# Patient Record
Sex: Male | Born: 1960
Health system: Southern US, Community
[De-identification: ages and names within clinical notes are randomized; demographics above are authoritative.]

## PROBLEM LIST (undated history)

## (undated) DIAGNOSIS — R011 Cardiac murmur, unspecified: Secondary | ICD-10-CM

## (undated) DIAGNOSIS — K219 Gastro-esophageal reflux disease without esophagitis: Secondary | ICD-10-CM

## (undated) DIAGNOSIS — M719 Bursopathy, unspecified: Secondary | ICD-10-CM

## (undated) DIAGNOSIS — I10 Essential (primary) hypertension: Secondary | ICD-10-CM

## (undated) DIAGNOSIS — I5189 Other ill-defined heart diseases: Secondary | ICD-10-CM

## (undated) HISTORY — PX: TONSILLECTOMY: SUR1361

## (undated) HISTORY — PX: OTHER SURGICAL HISTORY: SHX169

## (undated) HISTORY — PX: CHOLECYSTECTOMY: SHX55

---

## 2011-09-01 ENCOUNTER — Encounter (INDEPENDENT_AMBULATORY_CARE_PROVIDER_SITE_OTHER): Payer: Self-pay | Admitting: *Deleted

## 2012-01-19 ENCOUNTER — Encounter (HOSPITAL_COMMUNITY): Payer: Self-pay | Admitting: Pharmacy Technician

## 2012-01-24 NOTE — H&P (Signed)
Aaron Matthews is an 51 y.o. male.    Chief Complaint:  Left hip OA and pain   HPI: Pt is a 51 y.o. male complaining of left hip pain since February of this year. Pain had continually increased since the beginning. X-rays in the clinic show arthritic changes of the left hip.  An MRI was obtained that showed a large subchondral fracture of the proximal left femur with extensive surrounding marrow edema.  Pt has tried various conservative treatments which have failed to alleviate their symptoms, including NSAIDs and intra-articular injection.  The intra-articular injection did help for a couple of hours, but the pain quickly returned. Various options are discussed with the patient. Risks, benefits and expectations were discussed with the patient. Patient understand the risks, benefits and expectations and wishes to proceed with surgery.   PCP:  Cassell Smiles., MD  D/C Plans:  Home with HHPT  Post-op Meds:   Rx given for ASA, Robaxin, Iron, Colace and MiraLax  Tranexamic Acid:   To be given  Decadron:   Not to be given - DM  PMH: HTN Hemorrhoids Dentures Diabetes  PSH: Tonsillectomy Cholecystectomy  Social History: Admits to smoking previously, but has quit.   Allergies:  Allergies  Allergen Reactions  . Penicillins Swelling and Rash    Medications: No current facility-administered medications for this encounter.   Current Outpatient Prescriptions  Medication Sig Dispense Refill  . enalapril (VASOTEC) 10 MG tablet Take 10 mg by mouth 2 (two) times daily.      . insulin aspart (NOVOLOG) 100 UNIT/ML injection Inject 15-19 Units into the skin 3 (three) times daily before meals. 100-150=15units 150-200=16units 201-250 17 units 251-300 18 units 301-350 19 units      . insulin detemir (LEVEMIR) 100 UNIT/ML injection Inject 60 Units into the skin at bedtime.      . metFORMIN (GLUCOPHAGE) 1000 MG tablet Take 1,000 mg by mouth 2 (two) times daily with a meal.      . ranitidine  (ZANTAC) 150 MG tablet Take 150 mg by mouth 2 (two) times daily.      . traMADol-acetaminophen (ULTRACET) 37.5-325 MG per tablet Take 1 tablet by mouth every 6 (six) hours as needed. Pain         ROS: Review of Systems  Constitutional: Negative.   HENT: Negative.   Eyes: Negative.   Respiratory: Negative.   Cardiovascular: Negative.   Gastrointestinal: Negative.   Genitourinary: Negative.   Musculoskeletal: Positive for joint pain.  Neurological: Negative.   Endo/Heme/Allergies: Negative.   Psychiatric/Behavioral: Negative.      Physical Exam: BP:   135/96  ;  HR:   90  ; Resp:   18  ; Physical Exam  Constitutional: He is oriented to person, place, and time and well-developed, well-nourished, and in no distress.  HENT:  Head: Normocephalic and atraumatic.  Nose: Nose normal.  Mouth/Throat: Oropharynx is clear and moist. He has dentures.  Eyes: Pupils are equal, round, and reactive to light.  Neck: Neck supple. No JVD present. No tracheal deviation present. No thyromegaly present.  Cardiovascular: Normal rate, regular rhythm, normal heart sounds and intact distal pulses.   Pulmonary/Chest: Effort normal and breath sounds normal. No stridor. No respiratory distress. He has no wheezes.  Abdominal: Soft. There is no tenderness. There is no guarding.  Musculoskeletal:       Left hip: He exhibits decreased range of motion, decreased strength, tenderness and bony tenderness. He exhibits no crepitus, no deformity and no  laceration.  Lymphadenopathy:    He has no cervical adenopathy.  Neurological: He is alert and oriented to person, place, and time.  Skin: Skin is warm and dry.  Psychiatric: Affect normal.      Assessment/Plan Assessment:   Left hip OA and pain   Plan: Patient will undergo a left total hip arthroplasty, anterior approach on 01/30/2012 per Dr. Charlann Boxer at [facility]. Risks benefits and expectations were discussed with the patient. Patient understand risks,  benefits and expectations and wishes to proceed.   Aaron Matthews   PAC  01/24/2012, 11:48 AM

## 2012-01-25 ENCOUNTER — Encounter (HOSPITAL_COMMUNITY)
Admission: RE | Admit: 2012-01-25 | Discharge: 2012-01-25 | Disposition: A | Discharge status: Home / Self Care | Payer: BC Managed Care – PPO | Source: Ambulatory Visit | Attending: Orthopedic Surgery | Admitting: Orthopedic Surgery

## 2012-01-25 ENCOUNTER — Ambulatory Visit (HOSPITAL_COMMUNITY)
Admission: RE | Admit: 2012-01-25 | Discharge: 2012-01-25 | Disposition: A | Discharge status: Home / Self Care | Payer: BC Managed Care – PPO | Source: Ambulatory Visit | Attending: Orthopedic Surgery | Admitting: Orthopedic Surgery

## 2012-01-25 ENCOUNTER — Encounter (HOSPITAL_COMMUNITY): Payer: Self-pay

## 2012-01-25 DIAGNOSIS — M169 Osteoarthritis of hip, unspecified: Secondary | ICD-10-CM | POA: Insufficient documentation

## 2012-01-25 DIAGNOSIS — K219 Gastro-esophageal reflux disease without esophagitis: Secondary | ICD-10-CM | POA: Insufficient documentation

## 2012-01-25 DIAGNOSIS — Z01812 Encounter for preprocedural laboratory examination: Secondary | ICD-10-CM | POA: Insufficient documentation

## 2012-01-25 DIAGNOSIS — I10 Essential (primary) hypertension: Secondary | ICD-10-CM | POA: Insufficient documentation

## 2012-01-25 DIAGNOSIS — E119 Type 2 diabetes mellitus without complications: Secondary | ICD-10-CM | POA: Insufficient documentation

## 2012-01-25 DIAGNOSIS — Z01810 Encounter for preprocedural cardiovascular examination: Secondary | ICD-10-CM | POA: Insufficient documentation

## 2012-01-25 DIAGNOSIS — M1610 Unilateral primary osteoarthritis, unspecified hip: Secondary | ICD-10-CM | POA: Insufficient documentation

## 2012-01-25 HISTORY — DX: Cardiac murmur, unspecified: R01.1

## 2012-01-25 HISTORY — DX: Bursopathy, unspecified: M71.9

## 2012-01-25 HISTORY — DX: Essential (primary) hypertension: I10

## 2012-01-25 HISTORY — DX: Gastro-esophageal reflux disease without esophagitis: K21.9

## 2012-01-25 LAB — BASIC METABOLIC PANEL
CO2: 28 mEq/L (ref 19–32)
Chloride: 100 mEq/L (ref 96–112)
Creatinine, Ser: 0.72 mg/dL (ref 0.50–1.35)
GFR calc Af Amer: >90 mL/min (ref >90)
Potassium: 4 mEq/L (ref 3.5–5.1)
Sodium: 138 mEq/L (ref 135–145)

## 2012-01-25 LAB — URINALYSIS, ROUTINE W REFLEX MICROSCOPIC
Ketones, ur: NEGATIVE mg/dL
Leukocytes, UA: NEGATIVE
Nitrite: NEGATIVE
Protein, ur: 30 mg/dL — AB
pH: 5.5 (ref 5.0–8.0)

## 2012-01-25 LAB — CBC
HCT: 38.4 % — ABNORMAL LOW (ref 39.0–52.0)
MCV: 84.6 fL (ref 78.0–100.0)
Platelets: 220 10*3/uL (ref 150–400)
RBC: 4.54 MIL/uL (ref 4.22–5.81)
RDW: 12.8 % (ref 11.5–15.5)
WBC: 6.8 10*3/uL (ref 4.0–10.5)

## 2012-01-25 LAB — URINE MICROSCOPIC-ADD ON

## 2012-01-25 LAB — APTT: aPTT: 32 seconds (ref 24–37)

## 2012-01-25 LAB — SURGICAL PCR SCREEN: MRSA, PCR: NEGATIVE

## 2012-01-25 LAB — PROTIME-INR: INR: 0.93 (ref 0.00–1.49)

## 2012-01-25 NOTE — Patient Instructions (Addendum)
20 Bensyn Vandegrift  01/25/2012   Your procedure is scheduled on:  01/30/12   Tuesday  Surgery 4098-1191  Report to [facility] at 0615      AM.  Call this number if you have problems the morning of surgery: [phone]      Remember: HAVE SNACK BEFORE BED OR MIDNIGHT Monday NIGHT/     TAKE ONE HALF DOSE INSULIN  Monday NIGHT  Do not eat food  Or drink :After Midnight. MONDAY NIGHT   Take these medicines the morning of surgery with A SIP OF WATER: ZANTAC DO NOT TAKE ANY DIABETES MEDICINE Tuesday MORNING  .  Contacts, dentures or partial plates can not be worn to surgery  Leave suitcase in the car. After surgery it may be brought to your room.  For patients admitted to the hospital, checkout time is 11:00 AM day of  discharge.             SPECIAL INSTRUCTIONS- SEE [place] PREPARING FOR SURGERY INSTRUCTION SHEET-     DO NOT WEAR JEWELRY, LOTIONS, POWDERS, OR PERFUMES.  WOMEN-- DO NOT SHAVE LEGS OR UNDERARMS FOR 12 HOURS BEFORE SHOWERS. MEN MAY SHAVE FACE.  Patients discharged the day of surgery will not be allowed to drive home. IF going home the day of surgery, you must have a driver and someone to stay with you for the first 24 hours  Name and phone number of your driver:       Wife or daughter                                                                 Please read over the following fact sheets that you were given: MRSA Information, Incentive Spirometry Sheet, Blood Transfusion Sheet  Information                                                                                   Monti Jilek  PST 336  [phone]

## 2012-01-25 NOTE — Progress Notes (Signed)
Dr Charlann Boxer or Matt-  ANCEF ORDERED PRE OP- HAS allergy to PENICILLIN- rash and swelling-   PLEASE CLARIFY IN EPIC IF YOU STILL WANT THIS OR SOMETHING ELSE-  Surgery 01/30/12  [facility]

## 2012-01-25 NOTE — Pre-Procedure Instructions (Signed)
Clearance Dr Sherwood Gambler on chart

## 2012-01-25 NOTE — Progress Notes (Signed)
01/25/12 1421  OBSTRUCTIVE SLEEP APNEA  Have you ever been diagnosed with sleep apnea through a sleep study? No  Do you snore loudly (loud enough to be heard through closed doors)?  1  Do you often feel tired, fatigued, or sleepy during the daytime? 0  Has anyone observed you stop breathing during your sleep? 0  Do you have, or are you being treated for high blood pressure? 1  BMI more than 35 kg/m2? 1  Age over 51 years old? 1  Neck circumference greater than 40 cm/18 inches? 1  Gender: 1  Obstructive Sleep Apnea Score 6   Score 4 or greater  Results sent to PCP

## 2012-01-26 NOTE — Pre-Procedure Instructions (Signed)
Faxed request to Dr Charlann Boxer and Bartholomew Crews to review abnormal urine with micro through EPIC system-   Urine was done 01/25/12

## 2012-01-29 MED ORDER — TRANEXAMIC ACID 100 MG/ML IV SOLN
15.0000 mg/kg | Freq: Once | INTRAVENOUS | Status: AC
  Administered 2012-01-30: 2220 mg via INTRAVENOUS
  Filled 2012-01-29: qty 22.2

## 2012-01-30 ENCOUNTER — Inpatient Hospital Stay (HOSPITAL_COMMUNITY): Payer: BC Managed Care – PPO

## 2012-01-30 ENCOUNTER — Encounter (HOSPITAL_COMMUNITY): Payer: Self-pay | Admitting: *Deleted

## 2012-01-30 ENCOUNTER — Encounter (HOSPITAL_COMMUNITY)
Admission: RE | Disposition: A | Discharge status: Home Health | Payer: Self-pay | Source: Ambulatory Visit | Attending: Orthopedic Surgery

## 2012-01-30 ENCOUNTER — Inpatient Hospital Stay (HOSPITAL_COMMUNITY): Payer: BC Managed Care – PPO | Admitting: Anesthesiology

## 2012-01-30 ENCOUNTER — Inpatient Hospital Stay (HOSPITAL_COMMUNITY)
Admission: RE | Admit: 2012-01-30 | Discharge: 2012-01-31 | DRG: 818 | Disposition: A | Discharge status: Home Health | Payer: BC Managed Care – PPO | Source: Ambulatory Visit | Attending: Orthopedic Surgery | Admitting: Orthopedic Surgery

## 2012-01-30 ENCOUNTER — Encounter (HOSPITAL_COMMUNITY): Payer: Self-pay | Admitting: Anesthesiology

## 2012-01-30 DIAGNOSIS — R739 Hyperglycemia, unspecified: Secondary | ICD-10-CM

## 2012-01-30 DIAGNOSIS — Z96649 Presence of unspecified artificial hip joint: Secondary | ICD-10-CM

## 2012-01-30 DIAGNOSIS — E119 Type 2 diabetes mellitus without complications: Secondary | ICD-10-CM | POA: Diagnosis present

## 2012-01-30 DIAGNOSIS — Z794 Long term (current) use of insulin: Secondary | ICD-10-CM

## 2012-01-30 DIAGNOSIS — D500 Iron deficiency anemia secondary to blood loss (chronic): Secondary | ICD-10-CM

## 2012-01-30 DIAGNOSIS — M1610 Unilateral primary osteoarthritis, unspecified hip: Principal | ICD-10-CM | POA: Diagnosis present

## 2012-01-30 DIAGNOSIS — Z9089 Acquired absence of other organs: Secondary | ICD-10-CM

## 2012-01-30 DIAGNOSIS — I10 Essential (primary) hypertension: Secondary | ICD-10-CM | POA: Diagnosis present

## 2012-01-30 DIAGNOSIS — M169 Osteoarthritis of hip, unspecified: Principal | ICD-10-CM | POA: Diagnosis present

## 2012-01-30 DIAGNOSIS — E871 Hypo-osmolality and hyponatremia: Secondary | ICD-10-CM

## 2012-01-30 DIAGNOSIS — D62 Acute posthemorrhagic anemia: Secondary | ICD-10-CM | POA: Diagnosis not present

## 2012-01-30 DIAGNOSIS — K649 Unspecified hemorrhoids: Secondary | ICD-10-CM | POA: Diagnosis present

## 2012-01-30 DIAGNOSIS — Z87891 Personal history of nicotine dependence: Secondary | ICD-10-CM

## 2012-01-30 DIAGNOSIS — K219 Gastro-esophageal reflux disease without esophagitis: Secondary | ICD-10-CM | POA: Diagnosis present

## 2012-01-30 DIAGNOSIS — Z79899 Other long term (current) drug therapy: Secondary | ICD-10-CM

## 2012-01-30 DIAGNOSIS — Z6841 Body Mass Index (BMI) 40.0 and over, adult: Secondary | ICD-10-CM

## 2012-01-30 DIAGNOSIS — Z880 Allergy status to penicillin: Secondary | ICD-10-CM

## 2012-01-30 HISTORY — PX: TOTAL HIP ARTHROPLASTY: SHX124

## 2012-01-30 LAB — GLUCOSE, CAPILLARY
Glucose-Capillary: 254 mg/dL — ABNORMAL HIGH (ref 70–99)
Glucose-Capillary: 270 mg/dL — ABNORMAL HIGH (ref 70–99)

## 2012-01-30 SURGERY — ARTHROPLASTY, HIP, TOTAL, ANTERIOR APPROACH
Anesthesia: General | Site: Hip | Laterality: Left | Wound class: Clean

## 2012-01-30 MED ORDER — FLEET ENEMA 7-19 GM/118ML RE ENEM: 1.0000 | ENEMA | Freq: Once | RECTAL | Status: AC | PRN

## 2012-01-30 MED ORDER — CEFAZOLIN SODIUM-DEXTROSE 2-3 GM-% IV SOLR
2.0000 g | Freq: Four times a day (QID) | INTRAVENOUS | Status: AC
  Administered 2012-01-30 (×2): 2 g via INTRAVENOUS
  Filled 2012-01-30 (×2): qty 50

## 2012-01-30 MED ORDER — ONDANSETRON HCL 4 MG/2ML IJ SOLN
4.0000 mg | Freq: Four times a day (QID) | INTRAMUSCULAR | Status: DC | PRN
  Filled 2012-01-30: qty 2

## 2012-01-30 MED ORDER — HYDROMORPHONE HCL PF 1 MG/ML IJ SOLN: 0.2500 mg | INTRAMUSCULAR | Status: DC | PRN

## 2012-01-30 MED ORDER — HYDROCODONE-ACETAMINOPHEN 7.5-325 MG PO TABS
1.0000 | ORAL_TABLET | ORAL | Status: DC
  Administered 2012-01-30: 2 via ORAL
  Administered 2012-01-31: 1 via ORAL
  Administered 2012-01-31: 2 via ORAL
  Administered 2012-01-31: 1 via ORAL
  Filled 2012-01-30 (×4): qty 2

## 2012-01-30 MED ORDER — LACTATED RINGERS IV SOLN
INTRAVENOUS | Status: DC | PRN
  Administered 2012-01-30: 08:00:00 via INTRAVENOUS

## 2012-01-30 MED ORDER — MIDAZOLAM HCL 5 MG/5ML IJ SOLN
INTRAMUSCULAR | Status: DC | PRN
  Administered 2012-01-30: 2 mg via INTRAVENOUS

## 2012-01-30 MED ORDER — SUCCINYLCHOLINE CHLORIDE 20 MG/ML IJ SOLN
INTRAMUSCULAR | Status: DC | PRN
  Administered 2012-01-30: 200 mg via INTRAVENOUS

## 2012-01-30 MED ORDER — GLYCOPYRROLATE 0.2 MG/ML IJ SOLN
INTRAMUSCULAR | Status: DC | PRN
  Administered 2012-01-30: 0.4 mg via INTRAVENOUS

## 2012-01-30 MED ORDER — FAMOTIDINE 20 MG PO TABS
20.0000 mg | ORAL_TABLET | Freq: Two times a day (BID) | ORAL | Status: DC
  Administered 2012-01-30 – 2012-01-31 (×2): 20 mg via ORAL
  Filled 2012-01-30 (×3): qty 1

## 2012-01-30 MED ORDER — PROPOFOL 10 MG/ML IV BOLUS
INTRAVENOUS | Status: DC | PRN
  Administered 2012-01-30: 300 mg via INTRAVENOUS

## 2012-01-30 MED ORDER — FERROUS SULFATE 325 (65 FE) MG PO TABS
325.0000 mg | ORAL_TABLET | Freq: Three times a day (TID) | ORAL | Status: DC
  Administered 2012-01-30 – 2012-01-31 (×3): 325 mg via ORAL
  Filled 2012-01-30 (×5): qty 1

## 2012-01-30 MED ORDER — PROMETHAZINE HCL 25 MG/ML IJ SOLN: 6.2500 mg | INTRAMUSCULAR | Status: DC | PRN

## 2012-01-30 MED ORDER — ROCURONIUM BROMIDE 100 MG/10ML IV SOLN
INTRAVENOUS | Status: DC | PRN
  Administered 2012-01-30: 35 mg via INTRAVENOUS
  Administered 2012-01-30: 10 mg via INTRAVENOUS
  Administered 2012-01-30: 20 mg via INTRAVENOUS
  Administered 2012-01-30: 5 mg via INTRAVENOUS

## 2012-01-30 MED ORDER — PHENOL 1.4 % MT LIQD: 1.0000 | OROMUCOSAL | Status: DC | PRN

## 2012-01-30 MED ORDER — METOCLOPRAMIDE HCL 5 MG/ML IJ SOLN: 5.0000 mg | Freq: Three times a day (TID) | INTRAMUSCULAR | Status: DC | PRN

## 2012-01-30 MED ORDER — METHOCARBAMOL 500 MG PO TABS
500.0000 mg | ORAL_TABLET | Freq: Four times a day (QID) | ORAL | Status: DC | PRN
  Administered 2012-01-31: 500 mg via ORAL
  Filled 2012-01-30: qty 1

## 2012-01-30 MED ORDER — RIVAROXABAN 10 MG PO TABS
10.0000 mg | ORAL_TABLET | Freq: Every day | ORAL | Status: DC
  Administered 2012-01-31: 10 mg via ORAL
  Filled 2012-01-30 (×2): qty 1

## 2012-01-30 MED ORDER — HYDROMORPHONE HCL PF 1 MG/ML IJ SOLN
INTRAMUSCULAR | Status: DC | PRN
  Administered 2012-01-30 (×2): 0.5 mg via INTRAVENOUS
  Administered 2012-01-30: 1 mg via INTRAVENOUS

## 2012-01-30 MED ORDER — HYDROMORPHONE HCL PF 1 MG/ML IJ SOLN
0.5000 mg | INTRAMUSCULAR | Status: DC | PRN
  Administered 2012-01-30 – 2012-01-31 (×2): 1 mg via INTRAVENOUS
  Filled 2012-01-30 (×2): qty 1

## 2012-01-30 MED ORDER — LIDOCAINE HCL (CARDIAC) 20 MG/ML IV SOLN
INTRAVENOUS | Status: DC | PRN
  Administered 2012-01-30: 80 mg via INTRAVENOUS

## 2012-01-30 MED ORDER — POLYETHYLENE GLYCOL 3350 17 G PO PACK
17.0000 g | PACK | Freq: Two times a day (BID) | ORAL | Status: DC
  Administered 2012-01-30 – 2012-01-31 (×2): 17 g via ORAL

## 2012-01-30 MED ORDER — ZOLPIDEM TARTRATE 5 MG PO TABS: 5.0000 mg | ORAL_TABLET | Freq: Every evening | ORAL | Status: DC | PRN

## 2012-01-30 MED ORDER — FENTANYL CITRATE 0.05 MG/ML IJ SOLN
INTRAMUSCULAR | Status: DC | PRN
  Administered 2012-01-30 (×2): 100 ug via INTRAVENOUS
  Administered 2012-01-30: 50 ug via INTRAVENOUS

## 2012-01-30 MED ORDER — ONDANSETRON HCL 4 MG PO TABS: 4.0000 mg | ORAL_TABLET | Freq: Four times a day (QID) | ORAL | Status: DC | PRN

## 2012-01-30 MED ORDER — INSULIN DETEMIR 100 UNIT/ML ~~LOC~~ SOLN
60.0000 [IU] | Freq: Every day | SUBCUTANEOUS | Status: DC
  Administered 2012-01-30: 60 [IU] via SUBCUTANEOUS
  Filled 2012-01-30: qty 10

## 2012-01-30 MED ORDER — INSULIN ASPART PROT & ASPART (70-30 MIX) 100 UNIT/ML ~~LOC~~ SUSP
5.0000 [IU] | Freq: Once | SUBCUTANEOUS | Status: DC
  Filled 2012-01-30: qty 3

## 2012-01-30 MED ORDER — CELECOXIB 200 MG PO CAPS
200.0000 mg | ORAL_CAPSULE | Freq: Two times a day (BID) | ORAL | Status: DC
  Administered 2012-01-30 – 2012-01-31 (×2): 200 mg via ORAL
  Filled 2012-01-30 (×3): qty 1

## 2012-01-30 MED ORDER — ALUM & MAG HYDROXIDE-SIMETH 200-200-20 MG/5ML PO SUSP: 30.0000 mL | ORAL | Status: DC | PRN

## 2012-01-30 MED ORDER — MENTHOL 3 MG MT LOZG: 1.0000 | LOZENGE | OROMUCOSAL | Status: DC | PRN

## 2012-01-30 MED ORDER — DOCUSATE SODIUM 100 MG PO CAPS
100.0000 mg | ORAL_CAPSULE | Freq: Two times a day (BID) | ORAL | Status: DC
  Administered 2012-01-30 – 2012-01-31 (×2): 100 mg via ORAL

## 2012-01-30 MED ORDER — 0.9 % SODIUM CHLORIDE (POUR BTL) OPTIME
TOPICAL | Status: DC | PRN
  Administered 2012-01-30: 1000 mL

## 2012-01-30 MED ORDER — SODIUM CHLORIDE 0.9 % IV SOLN
100.0000 mL/h | INTRAVENOUS | Status: DC
  Administered 2012-01-30 – 2012-01-31 (×3): 100 mL/h via INTRAVENOUS
  Filled 2012-01-30 (×9): qty 1000

## 2012-01-30 MED ORDER — ACETAMINOPHEN 10 MG/ML IV SOLN
INTRAVENOUS | Status: DC | PRN
  Administered 2012-01-30: 1000 mg via INTRAVENOUS

## 2012-01-30 MED ORDER — NEOSTIGMINE METHYLSULFATE 1 MG/ML IJ SOLN
INTRAMUSCULAR | Status: DC | PRN
  Administered 2012-01-30: 3 mg via INTRAVENOUS

## 2012-01-30 MED ORDER — INSULIN ASPART 100 UNIT/ML ~~LOC~~ SOLN
5.0000 [IU] | Freq: Once | SUBCUTANEOUS | Status: AC
  Administered 2012-01-30: 5 [IU] via SUBCUTANEOUS
  Filled 2012-01-30: qty 1

## 2012-01-30 MED ORDER — DIPHENHYDRAMINE HCL 25 MG PO CAPS: 25.0000 mg | ORAL_CAPSULE | Freq: Four times a day (QID) | ORAL | Status: DC | PRN

## 2012-01-30 MED ORDER — METOCLOPRAMIDE HCL 10 MG PO TABS: 5.0000 mg | ORAL_TABLET | Freq: Three times a day (TID) | ORAL | Status: DC | PRN

## 2012-01-30 MED ORDER — BISACODYL 10 MG RE SUPP: 10.0000 mg | Freq: Every day | RECTAL | Status: DC | PRN

## 2012-01-30 MED ORDER — INSULIN ASPART 100 UNIT/ML ~~LOC~~ SOLN
0.0000 [IU] | Freq: Three times a day (TID) | SUBCUTANEOUS | Status: DC
  Administered 2012-01-30: 8 [IU] via SUBCUTANEOUS

## 2012-01-30 MED ORDER — ONDANSETRON HCL 4 MG/2ML IJ SOLN
INTRAMUSCULAR | Status: DC | PRN
  Administered 2012-01-30: 4 mg via INTRAVENOUS

## 2012-01-30 MED ORDER — METHOCARBAMOL 100 MG/ML IJ SOLN
500.0000 mg | Freq: Four times a day (QID) | INTRAVENOUS | Status: DC | PRN
  Administered 2012-01-30 – 2012-01-31 (×2): 500 mg via INTRAVENOUS
  Filled 2012-01-30 (×2): qty 5

## 2012-01-30 MED ORDER — INSULIN ASPART 100 UNIT/ML ~~LOC~~ SOLN: 15.0000 [IU] | Freq: Three times a day (TID) | SUBCUTANEOUS | Status: DC

## 2012-01-30 MED ORDER — DEXTROSE 5 % IV SOLN
3.0000 g | INTRAVENOUS | Status: AC
  Administered 2012-01-30: 3 g via INTRAVENOUS
  Filled 2012-01-30: qty 3000

## 2012-01-30 MED ORDER — METFORMIN HCL 500 MG PO TABS
1000.0000 mg | ORAL_TABLET | Freq: Two times a day (BID) | ORAL | Status: DC
  Administered 2012-01-30 – 2012-01-31 (×2): 1000 mg via ORAL
  Filled 2012-01-30 (×4): qty 2

## 2012-01-30 SURGICAL SUPPLY — 38 items
BAG ZIPLOCK 12X15 (MISCELLANEOUS) ×4 IMPLANT
BLADE SAW SGTL 18X1.27X75 (BLADE) ×2 IMPLANT
CLOTH BEACON ORANGE TIMEOUT ST (SAFETY) ×2 IMPLANT
DERMABOND ADVANCED (GAUZE/BANDAGES/DRESSINGS) ×1
DERMABOND ADVANCED .7 DNX12 (GAUZE/BANDAGES/DRESSINGS) ×1 IMPLANT
DRAPE C-ARM 42X72 X-RAY (DRAPES) ×2 IMPLANT
DRAPE STERI IOBAN 125X83 (DRAPES) ×2 IMPLANT
DRAPE U-SHAPE 47X51 STRL (DRAPES) ×6 IMPLANT
DRSG AQUACEL AG ADV 3.5X10 (GAUZE/BANDAGES/DRESSINGS) ×2 IMPLANT
DRSG TEGADERM 2-3/8X2-3/4 SM (GAUZE/BANDAGES/DRESSINGS) ×2 IMPLANT
DRSG TEGADERM 4X4.75 (GAUZE/BANDAGES/DRESSINGS) IMPLANT
DURAPREP 26ML APPLICATOR (WOUND CARE) ×2 IMPLANT
ELECT BLADE TIP CTD 4 INCH (ELECTRODE) ×2 IMPLANT
ELECT REM PT RETURN 9FT ADLT (ELECTROSURGICAL) ×2
ELECTRODE REM PT RTRN 9FT ADLT (ELECTROSURGICAL) ×1 IMPLANT
EVACUATOR 1/8 PVC DRAIN (DRAIN) ×2 IMPLANT
FACESHIELD LNG OPTICON STERILE (SAFETY) ×8 IMPLANT
GAUZE SPONGE 2X2 8PLY STRL LF (GAUZE/BANDAGES/DRESSINGS) ×1 IMPLANT
GLOVE BIOGEL PI IND STRL 7.5 (GLOVE) ×1 IMPLANT
GLOVE BIOGEL PI IND STRL 8 (GLOVE) ×1 IMPLANT
GLOVE BIOGEL PI INDICATOR 7.5 (GLOVE) ×1
GLOVE BIOGEL PI INDICATOR 8 (GLOVE) ×1
GLOVE ECLIPSE 8.0 STRL XLNG CF (GLOVE) ×2 IMPLANT
GLOVE ORTHO TXT STRL SZ7.5 (GLOVE) ×4 IMPLANT
GOWN BRE IMP PREV XXLGXLNG (GOWN DISPOSABLE) ×6 IMPLANT
GOWN STRL NON-REIN LRG LVL3 (GOWN DISPOSABLE) ×2 IMPLANT
KIT BASIN OR (CUSTOM PROCEDURE TRAY) ×2 IMPLANT
PACK TOTAL JOINT (CUSTOM PROCEDURE TRAY) ×2 IMPLANT
PADDING CAST COTTON 6X4 STRL (CAST SUPPLIES) ×2 IMPLANT
SPONGE GAUZE 2X2 STER 10/PKG (GAUZE/BANDAGES/DRESSINGS) ×1
SUCTION FRAZIER 12FR DISP (SUCTIONS) ×2 IMPLANT
SUT MNCRL AB 4-0 PS2 18 (SUTURE) ×2 IMPLANT
SUT VIC AB 1 CT1 36 (SUTURE) ×6 IMPLANT
SUT VIC AB 2-0 CT1 27 (SUTURE) ×2
SUT VIC AB 2-0 CT1 TAPERPNT 27 (SUTURE) ×2 IMPLANT
SUT VLOC 180 0 24IN GS25 (SUTURE) ×2 IMPLANT
TOWEL OR 17X26 10 PK STRL BLUE (TOWEL DISPOSABLE) ×4 IMPLANT
TRAY FOLEY CATH 14FRSI W/METER (CATHETERS) ×2 IMPLANT

## 2012-01-30 NOTE — Preoperative (Signed)
Beta Blockers   Reason not to administer Beta Blockers:Not Applicable 

## 2012-01-30 NOTE — Anesthesia Postprocedure Evaluation (Signed)
  Anesthesia Post-op Note  Patient: Aaron Matthews  Procedure(s) Performed: Procedure(s) (LRB): TOTAL HIP ARTHROPLASTY ANTERIOR APPROACH (Left)  Patient Location: PACU  Anesthesia Type: General  Level of Consciousness: awake and alert   Airway and Oxygen Therapy: Patient Spontanous Breathing  Post-op Pain: mild  Post-op Assessment: Post-op Vital signs reviewed, Patient's Cardiovascular Status Stable, Respiratory Function Stable, Patent Airway and No signs of Nausea or vomiting  Post-op Vital Signs: stable  Complications: No apparent anesthesia complications

## 2012-01-30 NOTE — Anesthesia Preprocedure Evaluation (Addendum)
Anesthesia Evaluation  Patient identified by MRN, date of birth, ID band Patient awake    Reviewed: Allergy & Precautions, H&P , NPO status , Patient's Chart, lab work & pertinent test results  Airway Mallampati: II TM Distance: <3 FB Neck ROM: Full    Dental No notable dental hx.    Pulmonary neg pulmonary ROS,  breath sounds clear to auscultation  Pulmonary exam normal       Cardiovascular hypertension, Rhythm:Regular Rate:Normal     Neuro/Psych negative neurological ROS  negative psych ROS   GI/Hepatic Neg liver ROS, GERD-  ,  Endo/Other  diabetesMorbid obesity  Renal/GU negative Renal ROS  negative genitourinary   Musculoskeletal negative musculoskeletal ROS (+)   Abdominal   Peds negative pediatric ROS (+)  Hematology negative hematology ROS (+)   Anesthesia Other Findings   Reproductive/Obstetrics negative OB ROS                           Anesthesia Physical Anesthesia Plan  ASA: III  Anesthesia Plan: General   Post-op Pain Management:    Induction: Intravenous  Airway Management Planned: Oral ETT  Additional Equipment:   Intra-op Plan:   Post-operative Plan: Extubation in OR  Informed Consent: I have reviewed the patients History and Physical, chart, labs and discussed the procedure including the risks, benefits and alternatives for the proposed anesthesia with the patient or authorized representative who has indicated his/her understanding and acceptance.   Dental advisory given  Plan Discussed with: CRNA and Surgeon  Anesthesia Plan Comments:         Anesthesia Quick Evaluation

## 2012-01-30 NOTE — Evaluation (Signed)
Physical Therapy Evaluation Patient Details Name: Aaron Matthews MRN: [id number] DOB: 1961/04/01 Today's Date: 01/30/2012 Time: 4098-1191 PT Time Calculation (min): 45 min  PT Assessment / Plan / Recommendation Clinical Impression  Pt is S/P direct anterior THA today. Pt has c/o R foot numbness, tingling and feeling that R leg is not supporting in standing. Only stood and took sidesteps along bed. Pt will benefit from zPT to impprove in functional mobility and safety to DC to home.    PT Assessment  Patient needs continued PT services    Follow Up Recommendations  Home health PT    Does the patient have the potential to tolerate intense rehabilitation      Barriers to Discharge        Equipment Recommendations  Rolling walker with 5" wheels;3 in 1 bedside comode (bariatric)    Recommendations for Other Services OT consult   Frequency 7X/week    Precautions / Restrictions Precautions Precaution Comments: Pt. c/o numbness of R foot, ? R leg is a bit weaker.   Pertinent Vitals/Pain l hip/ thigh are sore. R foot is tingling       Mobility  Bed Mobility Bed Mobility: Supine to Sit;Sit to Supine Supine to Sit: 3: Mod assist;HOB elevated;With rails Sit to Supine: 3: Mod assist;HOB flat;With rail Details for Bed Mobility Assistance: pt. used sheet to move LLE to edge and back onto bed. Transfers Transfers: Sit to Stand;Stand to Sit Sit to Stand: 1: +2 Total assist;From elevated surface;With upper extremity assist Sit to Stand: Patient Percentage: 60% Stand to Sit: 1: +2 Total assist;To bed;With upper extremity assist Stand to Sit: Patient Percentage: 60% Details for Transfer Assistance: VC to push from bed and reach back. VC to step LLE forward but requied assistance. Ambulation/Gait Ambulation/Gait Assistance: 1: +2 Total assist Ambulation/Gait: Patient Percentage: 60% Ambulation Distance (Feet): 4 Feet (sideways up along the bed.) Assistive device: Rolling  walker Ambulation/Gait Assistance Details: Pt with decreased support of Rleg , pt. relates to pain and leg feeling weaker. Gait Pattern: Step-to pattern    Shoulder Instructions     Exercises Total Joint Exercises Heel Slides: AAROM;Left;5 reps;Supine (with assist of sheet.)   PT Diagnosis: Difficulty walking;Acute pain  PT Problem List: Decreased strength;Decreased range of motion;Decreased activity tolerance;Decreased mobility;Pain;Decreased safety awareness;Impaired sensation PT Treatment Interventions: DME instruction;Gait training;Functional mobility training;Therapeutic activities;Patient/family education   PT Goals Acute Rehab PT Goals PT Goal Formulation: With patient/family Time For Goal Achievement: 02/06/12 Potential to Achieve Goals: Good Pt will go Supine/Side to Sit: with supervision PT Goal: Supine/Side to Sit - Progress: Goal set today Pt will go Sit to Supine/Side: with supervision PT Goal: Sit to Supine/Side - Progress: Goal set today Pt will go Sit to Stand: with supervision PT Goal: Sit to Stand - Progress: Goal set today Pt will go Stand to Sit: with supervision PT Goal: Stand to Sit - Progress: Goal set today Pt will Ambulate: 51 - 150 feet;with supervision;with rolling walker PT Goal: Ambulate - Progress: Goal set today Pt will Perform Home Exercise Program: with supervision, verbal cues required/provided PT Goal: Perform Home Exercise Program - Progress: Goal set today  Visit Information  Last PT Received On: 01/30/12 Assistance Needed: +2    Subjective Data  Subjective: My R foot is numb . It feels like pins and needles. I can't feel my foot as well as the L when I stand up.   Prior Functioning  Home Living Lives With: Spouse Available Help at Discharge: Family Type of  Home: House Home Access: Ramped entrance Home Layout: One level Bathroom Toilet: Standard Additional Comments: Pt' s mother has equipment bu question that it will fit pt. Prior  Function Level of Independence: Independent Driving: Yes Communication Communication: No difficulties    Cognition  Overall Cognitive Status: Appears within functional limits for tasks assessed/performed Arousal/Alertness: Awake/alert Orientation Level: Appears intact for tasks assessed Behavior During Session: [facility] for tasks performed    Extremity/Trunk Assessment Right Lower Extremity Assessment RLE ROM/Strength/Tone: Deficits RLE ROM/Strength/Tone Deficits: toe extension/ dorsiflexion 4/5 , knee flexion 4/5, knee extension 4/5 hip flexion RLE Sensation: Deficits RLE Sensation Deficits: pt reports tingling in foot to ankle. states he can feel touch. states that Foot tingles more with weight. Left Lower Extremity Assessment LLE ROM/Strength/Tone: Deficits LLE ROM/Strength/Tone Deficits: pt requires assistance to flex hip and to move  leg to edge and back onto bed. LLE Sensation: WFL - Light Touch Trunk Assessment Trunk Assessment: Normal   Balance    End of Session PT - End of Session Activity Tolerance: Patient limited by fatigue;Patient limited by pain Patient left: with call bell/phone within reach;with family/visitor present Nurse Communication: Mobility status (aware of R foot tingling and )  GP     Rada Hay 01/30/2012, 5:15 PM

## 2012-01-30 NOTE — Progress Notes (Signed)
Pt with c/o numbness rt foot...able to wiggle foot great pulse excellent color Dr Charlann Boxer notified no new orders

## 2012-01-30 NOTE — Interval H&P Note (Signed)
History and Physical Interval Note:  01/30/2012 7:58 AM  Aaron Matthews  has presented today for surgery, with the diagnosis of Osteoarthritis of the Left Hip  The various methods of treatment have been discussed with the patient and family. After consideration of risks, benefits and other options for treatment, the patient has consented to  Procedure(s) (LRB) with comments: TOTAL HIP ARTHROPLASTY ANTERIOR APPROACH (Left) as a surgical intervention .  The patient's history has been reviewed, patient examined, no change in status, stable for surgery.  I have reviewed the patient's chart and labs.  Questions were answered to the patient's satisfaction.     Shelda Pal

## 2012-01-30 NOTE — Op Note (Signed)
NAME:  Aaron Matthews                ACCOUNT NO.: [id number]      MEDICAL RECORD NO.: [id number]      FACILITY:  [facility]      PHYSICIAN:  Durene Romans D  DATE OF BIRTH:  08/05/1960     DATE OF PROCEDURE:  01/30/2012                                 OPERATIVE REPORT         PREOPERATIVE DIAGNOSIS: Left  hip osteoarthritis.      POSTOPERATIVE DIAGNOSIS:  Left hip osteoarthritis.      PROCEDURE:  Left total hip replacement through an anterior approach   utilizing DePuy THR system, component size 54 pinnacle cup, a size 36+4 neutral   Altrex liner, a size 7 Hi Tri Lock stem with a 36+5 delta ceramic   ball.      SURGEON:  Madlyn Frankel. Charlann Boxer, M.D.      ASSISTANT:  Lanney Gins, PA      ANESTHESIA:  General.      SPECIMENS:  None.      COMPLICATIONS:  None.      BLOOD LOSS:  350 cc     DRAINS:  One Hemovac.      INDICATION OF THE PROCEDURE:  Aaron Matthews is a 51 y.o. male who had   presented to office for evaluation of left hip pain.  Radiographs revealed   progressive degenerative changes with bone-on-bone   articulation to the  hip joint.  The patient had painful limited range of   motion significantly affecting their overall quality of life.  The patient was failing to    respond to conservative measures, and at this point was ready   to proceed with more definitive measures.  The patient has noted progressive   degenerative changes in his hip, progressive problems and dysfunction   with regarding the hip prior to surgery.  Consent was obtained for   benefit of pain relief.  Specific risk of infection, DVT, component   failure, dislocation, need for revision surgery, as well discussion of   the anterior versus posterior approach were reviewed.  Consent was   obtained for benefit of anterior pain relief through an anterior   approach.      PROCEDURE IN DETAIL:  The patient was brought to operative theater.   Once adequate anesthesia, preoperative  antibiotics, 3 gm Ancef administered.   The patient was positioned supine on the OSI Hanna table.  Once adequate   padding of boney process was carried out, we had predraped out the hip, and  used fluoroscopy to confirm orientation of the pelvis and position.      The left hip was then prepped and draped from proximal iliac crest to   mid thigh with shower curtain technique.      Time-out was performed identifying the patient, planned procedure, and   extremity.     An incision was then made 2 cm distal and lateral to the   anterior superior iliac spine extending over the orientation of the   tensor fascia lata muscle and sharp dissection was carried down to the   fascia of the muscle and protractor placed in the soft tissues.      The fascia was then incised.  The muscle belly was identified and swept  laterally and retractor placed along the superior neck.  Following   cauterization of the circumflex vessels and removing some pericapsular   fat, a second cobra retractor was placed on the inferior neck.  A third   retractor was placed on the anterior acetabulum after elevating the   anterior rectus.  A L-capsulotomy was along the line of the   superior neck to the trochanteric fossa, then extended proximally and   distally.  Tag sutures were placed and the retractors were then placed   intracapsular.  We then identified the trochanteric fossa and   orientation of my neck cut, confirmed this radiographically   and then made a neck osteotomy with the femur on traction.  The femoral   head was removed without difficulty or complication.  Traction was let   off and retractors were placed posterior and anterior around the   acetabulum.      The labrum and foveal tissue were debrided.  I began reaming with a 47mm   reamer and reamed up to 53mm reamer with good bony bed preparation and a 54   cup was chosen.  The final 54mm Pinnacle cup was then impacted under fluoroscopy  to confirm the  depth of penetration and orientation with respect to   abduction.  A screw was placed followed by the hole eliminator.  The final   36+4 Altrex liner was impacted with good visualized rim fit.  The cup was positioned anatomically within the acetabular portion of the pelvis.      At this point, the femur was rolled at 80 degrees.  Further capsule was   released off the inferior aspect of the femoral neck.  I then   released the superior capsule proximally.  The hook was placed laterally   along the femur and elevated manually and held in position with the bed   hook.  The leg was then extended and adducted with the leg rolled to 100   degrees of external rotation.  Once the proximal femur was fully   exposed, I used a box osteotome to set orientation.  I then began   broaching with the starting chili pepper broach and passed this by hand and then broached up to 7.  With the 7 broach in place I chose a high offset neck and did a trial reduction.  With a trial 36+5 head I felt the offset was appropriate, leg lengths   appeared to be equal, confirmed radiographically.   Given these findings, I went ahead and dislocated the hip, repositioned all   retractors and positioned the right hip in the extended and abducted position.  The final 7 Hi Tri Lock stem was   chosen and it was impacted down to the level of neck cut.  Based on this   and the trial reduction, a 36+5 delta ceramic ball was chosen and   impacted onto a clean and dry trunnion, and the hip was reduced.  The   hip had been irrigated throughout the case again at this point.  I did   reapproximate the superior capsular leaflet to the anterior leaflet   using #1 Vicryl, placed a medium Hemovac drain deep.  The fascia of the   tensor fascia lata muscle was then reapproximated using #1 Vicryl.  The   remaining wound was closed with 2-0 Vicryl and running 4-0 Monocryl.   The hip was cleaned, dried, and dressed sterilely using Dermabond and    Aquacel dressing.  Drain  site dressed separately.  She was then brought   to recovery room in stable condition tolerating the procedure well.    Lanney Gins, PA-C was present for the entirety of the case involved from   preoperative positioning, perioperative retractor management, general   facilitation of the case, as well as primary wound closure as assistant.            Madlyn Frankel Charlann Boxer, M.D.            MDO/MEDQ  D:  02/14/2011  T:  02/14/2011  Job:  [id number]      Electronically Signed by Durene Romans M.D. on 02/20/2011 09:15:38 AM

## 2012-01-30 NOTE — Transfer of Care (Signed)
Immediate Anesthesia Transfer of Care Note  Patient: Aaron Matthews  Procedure(s) Performed: Procedure(s) (LRB) with comments: TOTAL HIP ARTHROPLASTY ANTERIOR APPROACH (Left)  Patient Location: PACU  Anesthesia Type: General  Level of Consciousness: awake, alert , oriented and patient cooperative  Airway & Oxygen Therapy: Patient Spontanous Breathing and Patient connected to face mask oxygen  Post-op Assessment: Report given to PACU RN, Post -op Vital signs reviewed and unstable, Anesthesiologist notified and Patient moving all extremities X 4  Post vital signs: Reviewed and stable  Complications: No apparent anesthesia complications

## 2012-01-31 ENCOUNTER — Encounter (HOSPITAL_COMMUNITY): Payer: Self-pay | Admitting: Orthopedic Surgery

## 2012-01-31 DIAGNOSIS — E871 Hypo-osmolality and hyponatremia: Secondary | ICD-10-CM

## 2012-01-31 DIAGNOSIS — D500 Iron deficiency anemia secondary to blood loss (chronic): Secondary | ICD-10-CM

## 2012-01-31 DIAGNOSIS — R739 Hyperglycemia, unspecified: Secondary | ICD-10-CM

## 2012-01-31 LAB — BASIC METABOLIC PANEL
BUN: 11 mg/dL (ref 6–23)
CO2: 24 mEq/L (ref 19–32)
Chloride: 95 mEq/L — ABNORMAL LOW (ref 96–112)
Creatinine, Ser: 0.74 mg/dL (ref 0.50–1.35)

## 2012-01-31 LAB — GLUCOSE, CAPILLARY
Glucose-Capillary: 254 mg/dL — ABNORMAL HIGH (ref 70–99)
Glucose-Capillary: 267 mg/dL — ABNORMAL HIGH (ref 70–99)

## 2012-01-31 LAB — CBC
HCT: 34.1 % — ABNORMAL LOW (ref 39.0–52.0)
MCV: 85 fL (ref 78.0–100.0)
RBC: 4.01 MIL/uL — ABNORMAL LOW (ref 4.22–5.81)
WBC: 9.5 10*3/uL (ref 4.0–10.5)

## 2012-01-31 MED ORDER — FERROUS SULFATE 325 (65 FE) MG PO TABS: 325.0000 mg | ORAL_TABLET | Freq: Three times a day (TID) | ORAL | Status: DC

## 2012-01-31 MED ORDER — METHOCARBAMOL 500 MG PO TABS: 500.0000 mg | ORAL_TABLET | Freq: Four times a day (QID) | ORAL | Status: DC | PRN

## 2012-01-31 MED ORDER — INSULIN ASPART 100 UNIT/ML ~~LOC~~ SOLN
0.0000 [IU] | Freq: Three times a day (TID) | SUBCUTANEOUS | Status: DC
  Administered 2012-01-31 (×2): 11 [IU] via SUBCUTANEOUS

## 2012-01-31 MED ORDER — DIPHENHYDRAMINE HCL 25 MG PO CAPS: 25.0000 mg | ORAL_CAPSULE | Freq: Four times a day (QID) | ORAL | Status: DC | PRN

## 2012-01-31 MED ORDER — HYDROCODONE-ACETAMINOPHEN 7.5-325 MG PO TABS: 1.0000 | ORAL_TABLET | ORAL | Status: DC | PRN

## 2012-01-31 MED ORDER — DSS 100 MG PO CAPS: 100.0000 mg | ORAL_CAPSULE | Freq: Two times a day (BID) | ORAL | Status: DC

## 2012-01-31 MED ORDER — POLYETHYLENE GLYCOL 3350 17 G PO PACK: 17.0000 g | PACK | Freq: Two times a day (BID) | ORAL | Status: DC

## 2012-01-31 MED ORDER — ASPIRIN EC 325 MG PO TBEC: 325.0000 mg | DELAYED_RELEASE_TABLET | Freq: Two times a day (BID) | ORAL | Status: DC

## 2012-01-31 NOTE — Discharge Summary (Signed)
Physician Discharge Summary  Patient ID: Aaron Matthews MRN: [id number] DOB/AGE: 51-Jun-1962 51 y.o.  Admit date: 01/30/2012 Discharge date:  01/31/2012  Procedures:  Procedure(s) (LRB): TOTAL HIP ARTHROPLASTY ANTERIOR APPROACH (Left)  Attending Physician:  Dr. Durene Romans   Admission Diagnoses:   Left hip OA and pain   Discharge Diagnoses:  Principal Problem:  *S/P left THA, AA Active Problems:  Expected blood loss anemia  Morbid obesity  Hyponatremia  Hyperglycemia HTN  Hemorrhoids  Dentures  Diabetes  HPI: Pt is a 51 y.o. male complaining of left hip pain since February of this year. Pain had continually increased since the beginning. X-rays in the clinic show arthritic changes of the left hip. An MRI was obtained that showed a large subchondral fracture of the proximal left femur with extensive surrounding marrow edema. Pt has tried various conservative treatments which have failed to alleviate their symptoms, including NSAIDs and intra-articular injection. The intra-articular injection did help for a couple of hours, but the pain quickly returned. Various options are discussed with the patient. Risks, benefits and expectations were discussed with the patient. Patient understand the risks, benefits and expectations and wishes to proceed with surgery.   PCP: Cassell Smiles., MD   Discharged Condition: good  Hospital Course:  Patient underwent the above stated procedure on 01/30/2012. Patient tolerated the procedure well and brought to the recovery room in good condition and subsequently to the floor.  POD #1 BP: 137/89 ; Pulse: 102 ; Temp: 98.2 F (36.8 C) ; Resp: 16 ; Pt's foley was removed, as well as the hemovac drain removed. IV was changed to a saline lock. Patient reports pain as mild, pain well controlled in the left hip. Only complaint is that he is having numbness of the right foot. Able to get around and wishes to be discharged home. Neurovascular intact except  for numbness of the right foot, dorsiflexion/plantar flexion intact, incision: dressing C/D/I, no cellulitis present and compartment soft.   LABS  Basename  01/31/12 0422   HGB  11.6  HCT  34.1    Discharge Exam: General appearance: alert, cooperative and no distress Extremities: Homans sign is negative, no sign of DVT, no edema, redness or tenderness in the calves or thighs and no ulcers, gangrene or trophic changes  Disposition: Home with follow up in 2 weeks   Follow-up Information    Follow up with Shelda Pal, MD. In 2 weeks.   Contact information:   [facility] [address] [place] [address] [phone]          Discharge Orders    Future Orders Please Complete By Expires   Diet - low sodium heart healthy      Call MD / Call 911      Comments:   If you experience chest pain or shortness of breath, CALL 911 and be transported to the hospital emergency room.  If you develope a fever above 101 F, pus (white drainage) or increased drainage or redness at the wound, or calf pain, call your surgeon's office.   Discharge instructions      Comments:   Maintain surgical dressing for 10 days, then replace with gauze and tape. Keep the area dry and clean until follow up. Follow up in 2 weeks at [facility]. Call with any questions or concerns.   Constipation Prevention      Comments:   Drink plenty of fluids.  Prune juice may be helpful.  You may use  a stool softener, such as Colace (over the counter) 100 mg twice a day.  Use MiraLax (over the counter) for constipation as needed.   Increase activity slowly as tolerated      Change dressing      Comments:   Maintain surgical dressing for 10 days, then replace with 4x4 guaze and tape. Keep the area dry and clean.   TED hose      Comments:   Use stockings (TED hose) for 2 weeks on both leg(s).  You may remove them at night for sleeping.      Current Discharge Medication List      START taking these medications   Details  aspirin EC 325 MG tablet Take 1 tablet (325 mg total) by mouth 2 (two) times daily. X 4 weeks Qty: 60 tablet, Refills: 0    diphenhydrAMINE (BENADRYL) 25 mg capsule Take 1 capsule (25 mg total) by mouth every 6 (six) hours as needed for itching, allergies or sleep. Qty: 30 capsule    docusate sodium 100 MG CAPS Take 100 mg by mouth 2 (two) times daily. Qty: 10 capsule    ferrous sulfate 325 (65 FE) MG tablet Take 1 tablet (325 mg total) by mouth 3 (three) times daily after meals.    HYDROcodone-acetaminophen (NORCO) 7.5-325 MG per tablet Take 1-2 tablets by mouth every 4 (four) hours as needed for pain. Qty: 30 tablet    methocarbamol (ROBAXIN) 500 MG tablet Take 1 tablet (500 mg total) by mouth every 6 (six) hours as needed (muscle spasms).    polyethylene glycol (MIRALAX / GLYCOLAX) packet Take 17 g by mouth 2 (two) times daily. Qty: 14 each      CONTINUE these medications which have NOT CHANGED   Details  enalapril (VASOTEC) 10 MG tablet Take 10 mg by mouth 2 (two) times daily.    insulin aspart (NOVOLOG) 100 UNIT/ML injection Inject 15-19 Units into the skin 3 (three) times daily before meals. 100-150=15units 150-200=16units 201-250 17 units 251-300 18 units 301-350 19 units    insulin detemir (LEVEMIR) 100 UNIT/ML injection Inject 60 Units into the skin at bedtime.    metFORMIN (GLUCOPHAGE) 1000 MG tablet Take 1,000 mg by mouth 2 (two) times daily with a meal.    ranitidine (ZANTAC) 150 MG tablet Take 150 mg by mouth 2 (two) times daily.      STOP taking these medications     traMADol-acetaminophen (ULTRACET) 37.5-325 MG per tablet Comments:  Reason for Stopping:           Signed: Anastasio Auerbach. Amarys Sliwinski   PAC  01/31/2012, 3:25 PM

## 2012-01-31 NOTE — Progress Notes (Signed)
   Subjective: 1 Day Post-Op Procedure(s) (LRB): TOTAL HIP ARTHROPLASTY ANTERIOR APPROACH (Left)   Patient reports pain as mild, pain well controlled in the left hip. Only complaint is that he is having numbness of the right foot.  Objective:   VITALS:   Filed Vitals:   01/31/12 0639  BP: 137/89  Pulse: 102  Temp: 98.2 F (36.8 C)   Resp: 16    Neurovascular intact Dorsiflexion/Plantar flexion intact Incision: dressing C/D/I No cellulitis present Compartment soft  LABS  Basename 01/31/12 0422  HGB 11.6*  HCT 34.1*  WBC 9.5  PLT 199     Basename 01/31/12 0422  NA 133*  K 4.5  BUN 11  CREATININE 0.74  GLUCOSE 274*     Assessment/Plan: 1 Day Post-Op Procedure(s) (LRB): TOTAL HIP ARTHROPLASTY ANTERIOR APPROACH (Left) HV drain d/c'ed Foley cath d/c'ed Advance diet Up with therapy D/C IV fluids Discharge home with home health when ready Numb foot most likely from the boot used to position the patient and irritating the nerves. He does have good pulses and ROM of the foot and ankle.   Expected ABLA  Treated with iron and will observe  Morbid Obesity (BMI >40)  Estimated Body mass index is 42.88 kg/(m^2) as calculated from the following:   Height as of this encounter: 6\' 1" (1.854 m).   Weight as of this encounter: 325 lb(147.419 kg). Patient also counseled that weight may inhibit the healing process Patient counseled that losing weight will help with future health issues  Hyponatremia Treated with IV fluids and will observe  Hyperglycemia  Changed to the high scale for sliding scale insulin. This still may not cover what he does at home, but it is closer     Anastasio Auerbach. Arnetha Silverthorne   PAC  01/31/2012, 9:37 AM

## 2012-01-31 NOTE — Progress Notes (Signed)
Inpatient Diabetes Program Recommendations  AACE/ADA: New Consensus Statement on Inpatient Glycemic Control (2013)  Target Ranges:  Prepandial:   less than 140 mg/dL      Peak postprandial:   less than 180 mg/dL (1-2 hours)      Critically ill patients:  140 - 180 mg/dL   Reason for Visit: Hyperglycemia Pt is a 51 y.o. male complaining of left hip pain since February of this year.  A left total hip arthroplasty performed on 10/08.  Hx IDDM. Results for Aaron Matthews, Aaron Matthews (MRN [id number]) as of 01/31/2012 10:08  Ref. Range 01/31/2012 04:22  Glucose Latest Range: 70-99 mg/dL 409 (H)  Results for Aaron Matthews, Aaron Matthews (MRN [id number]) as of 01/31/2012 10:08  Ref. Range 01/30/2012 07:11 01/30/2012 09:28 01/30/2012 11:04 01/30/2012 17:23 01/30/2012 21:39 01/31/2012 07:21  Glucose-Capillary Latest Range: 70-99 mg/dL 956 (H) 213 (H) 086 (H) 284 (H) 256 (H) 254 (H)  Results for Aaron Matthews, Aaron Matthews (MRN [id number]) as of 01/31/2012 10:08  Ref. Range 01/31/2012 04:22  Sodium Latest Range: 135-145 mEq/L 133 (L)  Potassium Latest Range: 3.5-5.1 mEq/L 4.5  Chloride Latest Range: 96-112 mEq/L 95 (L)  CO2 Latest Range: 19-32 mEq/L 24  BUN Latest Range: 6-23 mg/dL 11  Creatinine Latest Range: 0.50-1.35 mg/dL 5.28  Calcium Latest Range: 8.4-10.5 mg/dL 9.2  GFR calc non Af Amer Latest Range: >90 mL/min >90  GFR calc Af Amer Latest Range: >90 mL/min >90  Glucose Latest Range: 70-99 mg/dL 413 (H)     Inpatient Diabetes Program Recommendations Insulin - Meal Coverage: Add meal coverage insulin - Novolog 10 units tidwc HgbA1C: Check HgbA1C to assess glycemic control prior to hospitalization  Will continue to follow.

## 2012-01-31 NOTE — Progress Notes (Signed)
01/31/12 1300  PT Visit Information  Last PT Received On 01/31/12  Assistance Needed +1  PT Time Calculation  PT Start Time 1330  PT Stop Time 1352  PT Time Calculation (min) 22 min  Subjective Data  Subjective stillhas some numbness right foot  Precautions  Precautions None  Cognition  Overall Cognitive Status Appears within functional limits for tasks assessed/performed  Arousal/Alertness Awake/alert  Orientation Level Appears intact for tasks assessed  Behavior During Session [facility] for tasks performed  Transfers  Transfers Sit to Stand;Stand to Sit  Sit to Stand 6: Modified independent (Device/Increase time);From chair/3-in-1  Stand to Sit 6: Modified independent (Device/Increase time)  Ambulation/Gait  Ambulation/Gait Assistance 5: Supervision;6: Modified independent (Device/Increase time)  Ambulation Distance (Feet) 280 Feet  Assistive device Rolling walker  Gait Pattern Step-through pattern  Total Joint Exercises  Heel Slides (pt doing with his wife)  Ankle Circles/Pumps AROM;Both;10 reps  PT - End of Session  Activity Tolerance Patient tolerated treatment well  Patient left in chair;with call bell/phone within reach;with family/visitor present  PT - Assessment/Plan  Comments on Treatment Session pt wants to go home; doing well  PT Plan Discharge plan remains appropriate;Frequency remains appropriate  PT Frequency 7X/week  Follow Up Recommendations Home health PT  Equipment Recommended Rolling walker with 5" wheels;3 in 1 bedside comode;Other (comment)  Acute Rehab PT Goals  Time For Goal Achievement 02/06/12  Potential to Achieve Goals Good  Pt will go Sit to Stand with supervision  PT Goal: Sit to Stand - Progress Met  Pt will go Stand to Sit with supervision  PT Goal: Stand to Sit - Progress Met  Pt will Ambulate 51 - 150 feet;with supervision;with rolling walker  PT Goal: Ambulate - Progress Met

## 2012-01-31 NOTE — Care Management Note (Signed)
    Page 1 of 2   01/31/2012     4:38:09 PM   CARE MANAGEMENT NOTE 01/31/2012  Patient:  Aaron Matthews, Aaron Matthews   Account Number:  [id number]  Date Initiated:  01/31/2012  Documentation initiated by:  Konrad Felix  Subjective/Objective Assessment:   Patient admitted with left hip surgical intervention.     Action/Plan:   Patient will be discharged to home with home health services when medically stable.   Anticipated DC Date:  02/01/2012   Anticipated DC Plan:  HOME W HOME HEALTH SERVICES      DC Planning Services  CM consult      PAC Choice  DURABLE MEDICAL EQUIPMENT  HOME HEALTH   Choice offered to / List presented to:  C-3 Spouse        HH arranged  HH-2 PT      HH agency  [facility]   Status of service:  Completed, signed off Medicare Important Message given?   (If response is "NO", the following Medicare IM given date fields will be blank) Date Medicare IM given:   Date Additional Medicare IM given:    Discharge Disposition:  HOME W HOME HEALTH SERVICES  Per UR Regulation:  Reviewed for med. necessity/level of care/duration of stay  If discussed at Long Length of Stay Meetings, dates discussed:    Comments:  01/31/2012  4:30pm  Konrad Felix RN, case manager   [phone] [facility] services are now set up with [facility]), all documentation has been faxed over to them. Patient's wife is aware. Services will begin on Friday, 10/11.  01/31/2012  3:00pm  Konrad Felix RN, case manager   [phone] Met with patient and his wife to discuss choice with home health services. Stated they do not need the equipment that the physician has ordered because they already have it at home. First choice for home health PT was with [facility], but they do not serve the patient's location, nor does [place]. I have contacted [facility] and placed the referral. Just waiting to hear back with confirmation of services then will notify patient.

## 2012-01-31 NOTE — Evaluation (Signed)
Occupational Therapy Evaluation Patient Details Name: Aaron Matthews MRN: [id number] DOB: 08-01-1960 Today's Date: 01/31/2012 Time: 4098-1191 OT Time Calculation (min): 33 min  OT Assessment / Plan / Recommendation Clinical Impression  Pt is a 51 yo male who presents POD 2 LTHR anterior approach. All edcuation completed. Pt will have necessary level of A from family upon d/c.    OT Assessment  Patient does not need any further OT services    Follow Up Recommendations  No OT follow up    Barriers to Discharge      Equipment Recommendations  Rolling walker with 5" wheels;3 in 1 bedside comode;Other (comment) (wide)    Recommendations for Other Services    Frequency       Precautions / Restrictions Precautions Precaution Comments: Pt continuing to c/o numbness in R foot. Is able to feel light touch and pressure Restrictions Weight Bearing Restrictions:  (WBAT)   Pertinent Vitals/Pain No pain reported, but states he still feels like his R foot is asleep.    ADL  Grooming: Simulated;Set up Where Assessed - Grooming: Unsupported sitting Upper Body Bathing: Simulated;Set up Where Assessed - Upper Body Bathing: Unsupported sitting Lower Body Bathing: Simulated;Moderate assistance Where Assessed - Lower Body Bathing: Supported sit to stand Upper Body Dressing: Simulated;Set up Where Assessed - Upper Body Dressing: Unsupported sitting Lower Body Dressing: Simulated;Moderate assistance Where Assessed - Lower Body Dressing: Supported sit to Pharmacist, hospital: Performed;Minimal Dentist Method: Surveyor, minerals: Extra wide Materials engineer and Hygiene: Moderate assistance Where Assessed - Toileting Clothing Manipulation and Hygiene: Sit to stand from 3-in-1 or toilet Equipment Used: Rolling walker ADL Comments: Discussed with pt different options for tub benches/seats, where to obtain. Pt's wife is a CNA and  stated she would be assisting pt with all ADL tasks.    OT Diagnosis:    OT Problem List:   OT Treatment Interventions:     OT Goals    Visit Information  Last OT Received On: 01/31/12 Assistance Needed: +1 PT/OT Co-Evaluation/Treatment: Yes    Subjective Data  Subjective: My hip doesn't hurt at all. Patient Stated Goal: I'd like to go home this evening.   Prior Functioning     Home Living Lives With: Spouse Available Help at Discharge: Family Type of Home: House Home Access: Ramped entrance Home Layout: One level Bathroom Shower/Tub: Engineer, manufacturing systems: Standard Home Adaptive Equipment: Shower chair with back Additional Comments: Pt has a BSC which wife states is too narrow. Prior Function Level of Independence: Independent Driving: Yes Vocation: Full time employment Communication Communication: No difficulties Dominant Hand: Right         Vision/Perception     Cognition  Overall Cognitive Status: Appears within functional limits for tasks assessed/performed Arousal/Alertness: Awake/alert Orientation Level: Appears intact for tasks assessed Behavior During Session: [facility] for tasks performed    Extremity/Trunk Assessment Right Upper Extremity Assessment RUE ROM/Strength/Tone: [facility] for tasks assessed Left Upper Extremity Assessment LUE ROM/Strength/Tone: WFL for tasks assessed     Mobility Bed Mobility Bed Mobility: Supine to Sit Supine to Sit: 4: Min assist Details for Bed Mobility Assistance: min with LLE; cues to self assist Transfers Sit to Stand: 4: Min guard;From bed;From chair/3-in-1 Stand to Sit: 4: Min guard;To chair/3-in-1 Details for Transfer Assistance: VCs for hand placement and technique.     Shoulder Instructions     Exercise    Balance     End of Session OT - End  of Session Activity Tolerance: Patient tolerated treatment well Patient left: in chair;with call bell/phone within reach;with family/visitor present  GO      Karizma Cheek A OTR/L [phone] 01/31/2012, 9:49 AM

## 2012-01-31 NOTE — Progress Notes (Signed)
Utilization review completed.  

## 2012-01-31 NOTE — Progress Notes (Signed)
Physical Therapy Treatment Patient Details Name: Aaron Matthews MRN: [id number] DOB: [birth year] Today's Date: 01/31/2012 Time: 0830-0902 PT Time Calculation (min): 32 min  PT Assessment / Plan / Recommendation Comments on Treatment Session  pt progressing well today; feels better overall even though he had a rough night; pain 0-1/10 left hip; still with c/onumbness right foot, can't feel sock but responsive to light pressure, motor intact    Follow Up Recommendations  Home health PT     Does the patient have the potential to tolerate intense rehabilitation     Barriers to Discharge        Equipment Recommendations  Rolling walker with 5" wheels;3 in 1 bedside comode (wide )    Recommendations for Other Services    Frequency 7X/week   Plan Discharge plan remains appropriate;Frequency remains appropriate    Precautions / Restrictions Precautions Precaution Comments: Pt continuing to c/o numbness in R foot. Is able to feel light touch and pressure Restrictions Weight Bearing Restrictions:  (WBAT)   Pertinent Vitals/Pain HR 113--122 Resting at 110   Mobility  Bed Mobility Bed Mobility: Supine to Sit Supine to Sit: 4: Min assist Details for Bed Mobility Assistance: min with LLE; cues to self assist Transfers Transfers: Sit to Stand;Stand to Sit;Stand Pivot Transfers Sit to Stand: 4: Min guard;From bed;From chair/3-in-1 Stand to Sit: 4: Min guard;To chair/3-in-1 Stand Pivot Transfers: 4: Min guard Details for Transfer Assistance: VCs for hand placement and technique. Ambulation/Gait Ambulation/Gait Assistance: 4: Min guard Ambulation Distance (Feet): 280 Feet Assistive device: Rolling walker Ambulation/Gait Assistance Details: initial cues for sequence Gait Pattern: Step-to pattern;Step-through pattern    Exercises Total Joint Exercises Long Arc Quad: AROM;Left;10 reps   PT Diagnosis:    PT Problem List:   PT Treatment Interventions:     PT Goals Acute Rehab PT  Goals Time For Goal Achievement: 02/06/12 Potential to Achieve Goals: Good Pt will go Supine/Side to Sit: with supervision PT Goal: Supine/Side to Sit - Progress: Progressing toward goal Pt will go Sit to Stand: with supervision PT Goal: Sit to Stand - Progress: Progressing toward goal Pt will go Stand to Sit: with supervision PT Goal: Stand to Sit - Progress: Progressing toward goal Pt will Ambulate: 51 - 150 feet;with supervision;with rolling walker PT Goal: Ambulate - Progress: Progressing toward goal  Visit Information  Last PT Received On: 01/31/12 Assistance Needed: +1    Subjective Data  Subjective: right foot is still numb/tingling;  Patient Stated Goal: home!   Cognition  Overall Cognitive Status: Appears within functional limits for tasks assessed/performed Arousal/Alertness: Awake/alert Orientation Level: Appears intact for tasks assessed Behavior During Session: [facility] for tasks performed    Balance     End of Session PT - End of Session Activity Tolerance: Patient tolerated treatment well Patient left: in chair;with call bell/phone within reach;with family/visitor present   GP     [facility] 01/31/2012, 9:10 AM

## 2012-02-01 NOTE — Progress Notes (Signed)
Discharge summary sent to payer through [employer]  

## 2012-10-11 ENCOUNTER — Telehealth: Payer: Self-pay

## 2012-10-11 NOTE — Telephone Encounter (Signed)
Aaron Matthews at [facility] called Friday to follow up on a referral. You had sent patient a letter on 5/28 and she wanted to know how long do you normally wait before closing out the referral. I told her I wasn't sure how long you waited and I would have you call her back on Monday.

## 2012-10-14 NOTE — Telephone Encounter (Signed)
Called pt. Many rings and no answer.  

## 2012-10-14 NOTE — Telephone Encounter (Signed)
Called Aaron Matthews and spoke to her. She is aware that I try to call pt after I send a letter out if they do not return my call. She should know within a month after the  Referral. I allow sometime for them to call and then call and try to leave a message for them to call.

## 2012-10-16 ENCOUNTER — Telehealth: Payer: Self-pay

## 2012-10-16 NOTE — Telephone Encounter (Signed)
Called pt and triaged. When I got ready to schedule him he said he wanted Dr. Karilyn Cota and hung up abruptly, saying that he would call back later. I did not have time to give him NUR's phone number.

## 2012-10-16 NOTE — Telephone Encounter (Signed)
Called pt to triage for screening colonoscopy. When I was ready to schedule him, he wanted NUR. I told him he was no longer here, but he does have his own office in town.  He said he would call back and hung up before I could give him the phone number.

## 2012-10-18 NOTE — Telephone Encounter (Signed)
Letter to pt and PCP.  

## 2012-10-18 NOTE — Telephone Encounter (Signed)
Letter sent to pt and PCP.

## 2013-01-17 ENCOUNTER — Emergency Department (HOSPITAL_COMMUNITY): Payer: BC Managed Care – PPO

## 2013-01-17 ENCOUNTER — Inpatient Hospital Stay (HOSPITAL_COMMUNITY): Payer: BC Managed Care – PPO

## 2013-01-17 ENCOUNTER — Encounter (HOSPITAL_COMMUNITY): Payer: Self-pay

## 2013-01-17 ENCOUNTER — Inpatient Hospital Stay (HOSPITAL_COMMUNITY)
Admission: EM | Admit: 2013-01-17 | Discharge: 2013-01-18 | DRG: 144 | Disposition: A | Discharge status: Home / Self Care | Payer: BC Managed Care – PPO | Attending: Internal Medicine | Admitting: Internal Medicine

## 2013-01-17 DIAGNOSIS — Z9119 Patient's noncompliance with other medical treatment and regimen: Secondary | ICD-10-CM

## 2013-01-17 DIAGNOSIS — D500 Iron deficiency anemia secondary to blood loss (chronic): Secondary | ICD-10-CM

## 2013-01-17 DIAGNOSIS — M542 Cervicalgia: Secondary | ICD-10-CM | POA: Diagnosis present

## 2013-01-17 DIAGNOSIS — Z794 Long term (current) use of insulin: Secondary | ICD-10-CM

## 2013-01-17 DIAGNOSIS — E119 Type 2 diabetes mellitus without complications: Secondary | ICD-10-CM | POA: Diagnosis present

## 2013-01-17 DIAGNOSIS — Z6841 Body Mass Index (BMI) 40.0 and over, adult: Secondary | ICD-10-CM

## 2013-01-17 DIAGNOSIS — I5189 Other ill-defined heart diseases: Secondary | ICD-10-CM | POA: Diagnosis present

## 2013-01-17 DIAGNOSIS — E872 Acidosis, unspecified: Secondary | ICD-10-CM | POA: Diagnosis present

## 2013-01-17 DIAGNOSIS — Z91199 Patient's noncompliance with other medical treatment and regimen due to unspecified reason: Secondary | ICD-10-CM

## 2013-01-17 DIAGNOSIS — R739 Hyperglycemia, unspecified: Secondary | ICD-10-CM | POA: Diagnosis present

## 2013-01-17 DIAGNOSIS — I10 Essential (primary) hypertension: Secondary | ICD-10-CM | POA: Diagnosis present

## 2013-01-17 DIAGNOSIS — E861 Hypovolemia: Secondary | ICD-10-CM | POA: Diagnosis present

## 2013-01-17 DIAGNOSIS — I517 Cardiomegaly: Secondary | ICD-10-CM

## 2013-01-17 DIAGNOSIS — R51 Headache: Secondary | ICD-10-CM | POA: Diagnosis present

## 2013-01-17 DIAGNOSIS — Z87891 Personal history of nicotine dependence: Secondary | ICD-10-CM

## 2013-01-17 DIAGNOSIS — R4701 Aphasia: Secondary | ICD-10-CM | POA: Diagnosis present

## 2013-01-17 DIAGNOSIS — Z8249 Family history of ischemic heart disease and other diseases of the circulatory system: Secondary | ICD-10-CM

## 2013-01-17 DIAGNOSIS — M7150 Other bursitis, not elsewhere classified, unspecified site: Secondary | ICD-10-CM | POA: Diagnosis present

## 2013-01-17 DIAGNOSIS — Z96649 Presence of unspecified artificial hip joint: Secondary | ICD-10-CM

## 2013-01-17 DIAGNOSIS — R55 Syncope and collapse: Secondary | ICD-10-CM | POA: Diagnosis present

## 2013-01-17 DIAGNOSIS — Z833 Family history of diabetes mellitus: Secondary | ICD-10-CM

## 2013-01-17 DIAGNOSIS — K219 Gastro-esophageal reflux disease without esophagitis: Secondary | ICD-10-CM | POA: Diagnosis present

## 2013-01-17 DIAGNOSIS — I959 Hypotension, unspecified: Principal | ICD-10-CM | POA: Diagnosis present

## 2013-01-17 DIAGNOSIS — IMO0001 Reserved for inherently not codable concepts without codable children: Secondary | ICD-10-CM | POA: Diagnosis present

## 2013-01-17 DIAGNOSIS — E871 Hypo-osmolality and hyponatremia: Secondary | ICD-10-CM | POA: Diagnosis present

## 2013-01-17 DIAGNOSIS — D649 Anemia, unspecified: Secondary | ICD-10-CM | POA: Diagnosis present

## 2013-01-17 HISTORY — DX: Other ill-defined heart diseases: I51.89

## 2013-01-17 LAB — URINALYSIS, ROUTINE W REFLEX MICROSCOPIC
Bilirubin Urine: NEGATIVE
Glucose, UA: 500 mg/dL — AB
Hgb urine dipstick: NEGATIVE
Leukocytes, UA: NEGATIVE
Nitrite: NEGATIVE
Specific Gravity, Urine: >1.03 — ABNORMAL HIGH (ref 1.005–1.030)
pH: 6 (ref 5.0–8.0)

## 2013-01-17 LAB — GLUCOSE, CAPILLARY
Glucose-Capillary: 371 mg/dL — ABNORMAL HIGH (ref 70–99)
Glucose-Capillary: 264 mg/dL — ABNORMAL HIGH (ref 70–99)
Glucose-Capillary: 235 mg/dL — ABNORMAL HIGH (ref 70–99)
Glucose-Capillary: 189 mg/dL — ABNORMAL HIGH (ref 70–99)

## 2013-01-17 LAB — APTT: aPTT: 23 seconds — ABNORMAL LOW (ref 24–37)

## 2013-01-17 LAB — CBC WITH DIFFERENTIAL/PLATELET
Basophils Absolute: 0.1 10*3/uL (ref 0.0–0.1)
Basophils Relative: 1 % (ref 0–1)
HCT: 38.9 % — ABNORMAL LOW (ref 39.0–52.0)
Lymphocytes Relative: 23 % (ref 12–46)
Monocytes Absolute: 0.5 10*3/uL (ref 0.1–1.0)
Neutro Abs: 4.4 10*3/uL (ref 1.7–7.7)
Neutrophils Relative %: 67 % (ref 43–77)
Platelets: 186 10*3/uL (ref 150–400)
RDW: 12.7 % (ref 11.5–15.5)
WBC: 6.6 10*3/uL (ref 4.0–10.5)

## 2013-01-17 LAB — COMPREHENSIVE METABOLIC PANEL
ALT: 26 U/L (ref 0–53)
AST: 27 U/L (ref 0–37)
Albumin: 4.2 g/dL (ref 3.5–5.2)
Alkaline Phosphatase: 54 U/L (ref 39–117)
Chloride: 91 mEq/L — ABNORMAL LOW (ref 96–112)
Potassium: 4.1 mEq/L (ref 3.5–5.1)
Sodium: 132 mEq/L — ABNORMAL LOW (ref 135–145)
Total Bilirubin: 0.8 mg/dL (ref 0.3–1.2)
Total Protein: 6.7 g/dL (ref 6.0–8.3)

## 2013-01-17 LAB — TROPONIN I: Troponin I: <0.3 ng/mL (ref <0.30)

## 2013-01-17 MED ORDER — ACETAMINOPHEN 650 MG RE SUPP: 650.0000 mg | Freq: Four times a day (QID) | RECTAL | Status: DC | PRN

## 2013-01-17 MED ORDER — ALUM & MAG HYDROXIDE-SIMETH 200-200-20 MG/5ML PO SUSP: 30.0000 mL | Freq: Four times a day (QID) | ORAL | Status: DC | PRN

## 2013-01-17 MED ORDER — ONDANSETRON HCL 4 MG PO TABS: 4.0000 mg | ORAL_TABLET | Freq: Four times a day (QID) | ORAL | Status: DC | PRN

## 2013-01-17 MED ORDER — SODIUM CHLORIDE 0.9 % IV SOLN
INTRAVENOUS | Status: DC
  Administered 2013-01-17: 16:00:00 via INTRAVENOUS

## 2013-01-17 MED ORDER — SODIUM CHLORIDE 0.9 % IV SOLN
1000.0000 mL | Freq: Once | INTRAVENOUS | Status: AC
  Administered 2013-01-17: 1000 mL via INTRAVENOUS

## 2013-01-17 MED ORDER — ASPIRIN 325 MG PO TABS
325.0000 mg | ORAL_TABLET | Freq: Once | ORAL | Status: AC
  Administered 2013-01-17: 325 mg via ORAL
  Filled 2013-01-17: qty 1

## 2013-01-17 MED ORDER — INSULIN ASPART 100 UNIT/ML ~~LOC~~ SOLN
0.0000 [IU] | Freq: Every day | SUBCUTANEOUS | Status: DC
Start: 2013-01-17 — End: 2013-01-18

## 2013-01-17 MED ORDER — ASPIRIN EC 81 MG PO TBEC
81.0000 mg | DELAYED_RELEASE_TABLET | Freq: Every day | ORAL | Status: DC
  Administered 2013-01-17 – 2013-01-18 (×2): 81 mg via ORAL
  Filled 2013-01-17 (×2): qty 1

## 2013-01-17 MED ORDER — SODIUM CHLORIDE 0.9 % IV SOLN
1000.0000 mL | INTRAVENOUS | Status: DC
  Administered 2013-01-17: 1000 mL via INTRAVENOUS

## 2013-01-17 MED ORDER — INSULIN DETEMIR 100 UNIT/ML ~~LOC~~ SOLN
25.0000 [IU] | Freq: Every day | SUBCUTANEOUS | Status: DC
  Administered 2013-01-17: 25 [IU] via SUBCUTANEOUS
  Filled 2013-01-17 (×3): qty 0.25

## 2013-01-17 MED ORDER — INSULIN ASPART 100 UNIT/ML ~~LOC~~ SOLN
0.0000 [IU] | Freq: Three times a day (TID) | SUBCUTANEOUS | Status: DC
  Administered 2013-01-17 – 2013-01-18 (×2): 5 [IU] via SUBCUTANEOUS

## 2013-01-17 MED ORDER — ACETAMINOPHEN 325 MG PO TABS: 650.0000 mg | ORAL_TABLET | Freq: Four times a day (QID) | ORAL | Status: DC | PRN

## 2013-01-17 MED ORDER — MORPHINE SULFATE 2 MG/ML IJ SOLN: 1.0000 mg | INTRAMUSCULAR | Status: DC | PRN

## 2013-01-17 MED ORDER — GADOBENATE DIMEGLUMINE 529 MG/ML IV SOLN
20.0000 mL | Freq: Once | INTRAVENOUS | Status: AC | PRN
  Administered 2013-01-17: 20 mL via INTRAVENOUS

## 2013-01-17 MED ORDER — ONDANSETRON HCL 4 MG/2ML IJ SOLN: 4.0000 mg | Freq: Four times a day (QID) | INTRAMUSCULAR | Status: DC | PRN

## 2013-01-17 MED ORDER — HYDROCODONE-ACETAMINOPHEN 5-325 MG PO TABS: 1.0000 | ORAL_TABLET | ORAL | Status: DC | PRN

## 2013-01-17 MED ORDER — ENOXAPARIN SODIUM 40 MG/0.4ML ~~LOC~~ SOLN
40.0000 mg | SUBCUTANEOUS | Status: DC
  Administered 2013-01-17: 40 mg via SUBCUTANEOUS
  Filled 2013-01-17: qty 0.4

## 2013-01-17 NOTE — Progress Notes (Signed)
*  PRELIMINARY RESULTS* Echocardiogram 2D Echocardiogram has been performed.  Zina Pitzer 01/17/2013, 5:25 PM

## 2013-01-17 NOTE — ED Notes (Signed)
Pt reports neck pain and headache for the past 10 days.  Pt family reports the pt left for work this a.m "normal" but around 0910 pt fell into the floor at work.  Pt is slow to respond to questions but is alert x4.  Pt has equal grip strengths.

## 2013-01-17 NOTE — H&P (Signed)
[facility] History and Physical  [name] UJW:[id number] DOB: [birth year] DOA: 01/17/2013  Referring physician:  PCP: Cassell Smiles., MD  Specialists:   Chief Complaint: Presyncope and hypotension  HPI: Aaron Matthews is a 52 y.o. male with a past medical history that includes hypertension, diabetes, obesity. He presents to the emergency department with chief complaint of sudden lightheadedness and a feeling as if he was going to pass out. The patient indicates that he was walking from the parking lot into work this morning when he suddenly became very lightheaded. He felt as if he was going to pass out. He did not want to fall because he had a replacement last year. Therefore, he sat himself down on the ground. His coworkers called 911 but the patient refused to go to medical facility in [place], [place]. So, his family transported him to [facility]. During this episode the patient denies any chest pain, palpitation ,visual disturbances, nausea, or vomiting. Associated symptoms include paleness and diaphoresis according to coworkers.  The patient's wife also reports that patient had slurred speech when she first spoke to him immediately after the event on the telephone. Patient denies any fever, chills, dysuria, hematuria, frequency or urgency. He denies any abdominal pain constipation, diarrhea, or melena. He has been eating and drinking his normal amount. He has made no changes in his medications. He does not check his blood sugars on a daily basis. He does indicate for the last 7 days he has had some left neck discomfort which he describes as "felt like I slept well". Associated with this pain is an intermittent posterior headache that radiates to the right greater than left side of the neck. His workup in the emergency department is significant for a blood pressure of 70/44 and heart rate of 98. Lab data are significant for a sodium of 132, chloride 91 and serum glucose of 399.  Urinalysis yields trace ketones, glucose, and greater than 1.03 specific gravity. CT of the head was negative and chest x-ray yields no active disease. EKG yields inferior infarct age undetermined. Compared to EKG in October 2013, inferior infarct is now present. In the emergency room he was given 2 L of normal saline. At the time of my exam, his systolic blood pressure is 113 and heart rate 96.  CBG is now 264. Hospitalists are asked to admit.   Review of Systems: Complete review of systems reviewed and all systems negative except as indicated in history of present illness  Past Medical History  Diagnosis Date  . Hypertension   . Heart murmur     as achild  . Diabetes mellitus   . GERD (gastroesophageal reflux disease)   . Bursitis     right shoulder   Past Surgical History  Procedure Laterality Date  . Tonsillectomy    . Cholecystectomy    . Dental extraction      complete  . Total hip arthroplasty  01/30/2012    Procedure: TOTAL HIP ARTHROPLASTY ANTERIOR APPROACH;  Surgeon: Shelda Pal, MD;  Location: [facility];  Service: Orthopedics;  Laterality: Left;   Social History:  reports that he quit smoking about 19 years ago. His smoking use included Cigarettes. He has a 10 pack-year smoking history. He has never used smokeless tobacco. He reports that he does not drink alcohol or use illicit drugs. He is married and lives with his wife in [place]. He is a Event organiser. Is a former smoker.  Allergies  Allergen Reactions  . Penicillins  Swelling and Rash    Family history: His mother has diabetes and CAD. Father is deceased, medical history unknown. He has 2 siblings whose collective health history is positive for diabetes, hypertension, fibromyalgia,  Prior to Admission medications   Medication Sig Start Date End Date Taking? Authorizing Provider  enalapril (VASOTEC) 10 MG tablet Take 10 mg by mouth 2 (two) times daily.   Yes Historical Provider, MD  insulin aspart (NOVOLOG) 100  UNIT/ML injection Inject 15-19 Units into the skin 3 (three) times daily before meals. 100-150=15units 150-200=16units 201-250 17 units 251-300 18 units 301-350 19 units   Yes Historical Provider, MD  insulin detemir (LEVEMIR) 100 UNIT/ML injection Inject 60 Units into the skin at bedtime.    Yes Historical Provider, MD  metFORMIN (GLUCOPHAGE) 1000 MG tablet Take 1,000 mg by mouth 2 (two) times daily with a meal.   Yes Historical Provider, MD   Physical Exam: Filed Vitals:   01/17/13 1304  BP:   Pulse:   Temp: 97.7 F (36.5 C)  Resp:    Blood pressure 107/63 pulse 99 oxygen saturation 96% on room air   General:  Obese 52 year old Caucasian man slightly, in no acute distress  Eyes: Pupils are equal, round, and reactive to light. Extraocular was are intact. Conjunctivae are clear. Sclerae are white.  ENT: Ears clear, nose without drainage, oropharynx pink and very dry with no exudate or erythema noted  Neck: Supple no JVD. I appreciate no lymphadenopathy. Full range of motion. Mildly tender over right trapezius muscle, no spasm, no erythema, no warmth.  Cardiovascular: Distant S1, S2, with no murmurs rubs or gallops. There is no lower extremity edema. Pedal pulses are present and palpable  Respiratory: Normal effort. Rest sounds somewhat distant but fine crackles auscultated particularly in left base, otherwise no wheezes or rhonchi appreciated  Abdomen: Obese, soft with sluggish bowel sounds. His abdomen is nontender to palpation, no mass organomegaly noted  Skin: Slightly pale except for his face which is slightly flushed. His skin is warm and dry. No rashes or lesions noted  Musculoskeletal: Moves all extremities. Joints without erythema or swelling. There is no clubbing or cyanosis  Psychiatric: He is calm and cooperative  Neurologic: Slightly lethargic but cranial nerves II through XII are grossly intact. Speech is clear. Facial symmetry intact.  Labs on Admission:  Basic  Metabolic Panel:  Recent Labs Lab 01/17/13 1107  NA 132*  K 4.1  CL 91*  CO2 23  GLUCOSE 399*  BUN 19  CREATININE 1.13  CALCIUM 9.8   Liver Function Tests:  Recent Labs Lab 01/17/13 1107  AST 27  ALT 26  ALKPHOS 54  BILITOT 0.8  PROT 6.7  ALBUMIN 4.2   No results found for this basename: LIPASE, AMYLASE,  in the last 168 hours No results found for this basename: AMMONIA,  in the last 168 hours CBC:  Recent Labs Lab 01/17/13 1107  WBC 6.6  NEUTROABS 4.4  HGB 13.4  HCT 38.9*  MCV 84.7  PLT 186   Cardiac Enzymes:  Recent Labs Lab 01/17/13 1107  TROPONINI <0.30    BNP (last 3 results) No results found for this basename: PROBNP,  in the last 8760 hours CBG:  Recent Labs Lab 01/17/13 1057 01/17/13 1441  GLUCAP 371* 264*    Radiological Exams on Admission: Ct Head Wo Contrast  01/17/2013   CLINICAL DATA:  Code stroke. Headache and weakness and aphasia  EXAM: CT HEAD WITHOUT CONTRAST  TECHNIQUE: Contiguous axial images  were obtained from the base of the skull through the vertex without intravenous contrast.  COMPARISON:  None.  FINDINGS: Ventricle size is normal. Negative for acute infarct, hemorrhage, or mass. No edema or midline shift. Calvarium intact.  IMPRESSION: Negative  CriticalValue/emergent results were called by telephone at the time of interpretation on 01/17/2013 at 11:25 AMto Dr. Adriana Simas, who verbally acknowledged these results.   Electronically Signed   By: Marlan Palau M.D.   On: 01/17/2013 11:25   Dg Chest Port 1 View  01/17/2013   CLINICAL DATA:  Hypotension.  EXAM: PORTABLE CHEST - 1 VIEW  COMPARISON:  01/25/2012  FINDINGS: The heart size and mediastinal contours are within normal limits. Both lungs are clear. The visualized skeletal structures are unremarkable.  IMPRESSION: No active disease.   Electronically Signed   By: Myles Rosenthal   On: 01/17/2013 14:14    EKG: Independently reviewed normal sinus rhythm with inferior infarct age  undetermined  Assessment/Plan Principal Problem:   Hypotension: Etiology uncertain, but the patient appears to be volume depleted in the setting of uncontrolled hyperglycemia and ongoing antihypertensive medication management. Does not seem to be an infectious process as his chest x-ray and urine are unremarkable. EKG with some changes since last year. Will repeat EKG in the morning. We'll cycle cardiac enzymes. Will order a 2-D echo as well.  Patient has responded quickly to fluid resuscitation. Will admit to step down unit overnight. Will continue vigorous IV fluids. Hold his home antihypertensives. Will monitor CBGs 4 times a day and use sliding scale insulin to get his blood sugar under better control. Active Problems: Pre-syncope: Likely related to #1 and hypovolemia. Patient reports no changes in his medications and that he's been only taking ibuprofen for his neck pain the last couple days.  Hyperglycemia with type 2 diabetes mellitus: Patient states that he does not check his blood sugar on a daily basis , however, he takes his insulin at bedtime as well as with breakfast and dinner. Will check a hemoglobin A1c. We'll provide Levemir or 25 units each bedtime, although the patient's home dose is 50 units. Monitor CBG 4 times a day and provide a car modified diet.   Hyponatremia: Mild, but likely secondary to hypovolemia. Will provide IV fluids and recheck in the morning.    Neck pain with posterior headache. In the setting of dysarthria and hypotension, would favor further assessment with an MRI of the brain to assess for posterior circulation abnormalities.    Morbid obesity: Will request nutritional consult as well as diabetic coordinator for glycemic control and weight loss.    GERD (gastroesophageal reflux disease): Appears to be stable at baseline. Patient does indicate he props himself up on 2 pillows due to reflux.      Code Status: full Family Communication: wife at  bedside Disposition Plan: home when ready  Time spent: 25 minutes  Gwenyth Bender [facility] Pager [phone]  If 7PM-7AM, please contact night-coverage [email or web address] Password [facility] 01/17/2013, 2:57 PM    Attending: The patient was seen and examined. He was discussed with nurse practitioner, Ms. Vedia Coffer. Agree with findings, assessment, and plan with ammendment and/or annotation. His blood pressure and his symptoms have improved somewhat on supportive IV fluid hydration. The patient appears to be volume depleted and his hypotension is likely related to hypovolemia as stated above. Given his mild dysarthria and posterior headache, we'll order MRI of his brain to assess further. 2-D echocardiogram has been ordered and is pending. TSH has  been ordered and is pending. Hemoglobin A1c has been ordered and is pending. We'll continue supportive treatment and vigorous IV fluids as stated. We'll try to get his blood glucose under better control.

## 2013-01-17 NOTE — ED Notes (Signed)
Report called to [place], RN in ICU/SD

## 2013-01-17 NOTE — ED Provider Notes (Addendum)
CSN: [id number]     Arrival date & time 01/17/13  1024 History   First MD Initiated Contact with Patient 01/17/13 1051     Chief Complaint  Patient presents with  . Aphasia  . Headache  . Weakness   (Consider location/radiation/quality/duration/timing/severity/associated sxs/prior Treatment) HPI Comments: Aaron Matthews is a 52 y.o. Male presenting with sudden onset of lightheadedness, weakness and near LOC while standing at work this morning.  He became pale and diaphoretic according to coworkers.  This occurred at 9:15 this am.  Wife at bedside reports slurred speech when she first spoke to him immediately after the event (by phone) which has now cleared.  She reports he has had a posterior headache radiating into his left neck area for the past 10 days which he has been treating with ibuprofen.  He denies chest pain, shortness of breath,  Denies focal weakness, numbness, no changes in visual acuity.  He has a history of hypertension and diabetes.  He is not on any blood thinners including aspirin.       Patient is a 52 y.o. male presenting with headaches and weakness. The history is provided by the patient and the spouse.  Headache Associated symptoms: dizziness and fatigue   Associated symptoms: no abdominal pain, no congestion, no fever, no nausea, no neck pain, no numbness and no sore throat   Weakness Associated symptoms include fatigue, headaches and weakness. Pertinent negatives include no abdominal pain, arthralgias, chest pain, congestion, fever, joint swelling, nausea, neck pain, numbness, rash or sore throat.    Past Medical History  Diagnosis Date  . Hypertension   . Heart murmur     as achild  . Diabetes mellitus   . GERD (gastroesophageal reflux disease)   . Bursitis     right shoulder   Past Surgical History  Procedure Laterality Date  . Tonsillectomy    . Cholecystectomy    . Dental extraction      complete  . Total hip arthroplasty  01/30/2012    Procedure:  TOTAL HIP ARTHROPLASTY ANTERIOR APPROACH;  Surgeon: Shelda Pal, MD;  Location: [facility];  Service: Orthopedics;  Laterality: Left;   No family history on file. History  Substance Use Topics  . Smoking status: Former Smoker -- 1.00 packs/day for 10 years    Types: Cigarettes    Quit date: 01/24/1993  . Smokeless tobacco: Never Used  . Alcohol Use: No    Review of Systems  Constitutional: Positive for fatigue. Negative for fever.  HENT: Negative for congestion, sore throat and neck pain.   Eyes: Negative.   Respiratory: Negative for chest tightness and shortness of breath.   Cardiovascular: Negative for chest pain.  Gastrointestinal: Negative for nausea and abdominal pain.  Genitourinary: Negative.   Musculoskeletal: Negative for joint swelling and arthralgias.  Skin: Negative.  Negative for rash and wound.  Neurological: Positive for dizziness, weakness, light-headedness and headaches. Negative for numbness.  Psychiatric/Behavioral: Negative.  Negative for confusion.    Allergies  Penicillins  Home Medications   Current Outpatient Rx  Name  Route  Sig  Dispense  Refill  . enalapril (VASOTEC) 10 MG tablet   Oral   Take 10 mg by mouth 2 (two) times daily.         . insulin aspart (NOVOLOG) 100 UNIT/ML injection   Subcutaneous   Inject 15-19 Units into the skin 3 (three) times daily before meals. 100-150=15units 150-200=16units 201-250 17 units 251-300 18 units 301-350 19 units         .  insulin detemir (LEVEMIR) 100 UNIT/ML injection   Subcutaneous   Inject 60 Units into the skin at bedtime.          . metFORMIN (GLUCOPHAGE) 1000 MG tablet   Oral   Take 1,000 mg by mouth 2 (two) times daily with a meal.          BP 113/63  Pulse 96  Temp(Src) 97.7 F (36.5 C)  Resp 17  SpO2 94% Physical Exam  Nursing note and vitals reviewed. Constitutional: He appears well-developed and well-nourished. He appears lethargic.  pale  HENT:  Head: Normocephalic and  atraumatic.  Mouth/Throat: Oropharynx is clear and moist.  Eyes: EOM are normal. Pupils are equal, round, and reactive to light.  Neck: Normal range of motion.  Cardiovascular: Normal rate, regular rhythm, normal heart sounds and intact distal pulses.   Pulmonary/Chest: Effort normal and breath sounds normal. He has no wheezes.  Abdominal: Soft. Bowel sounds are normal. There is no tenderness.  Obese, adipose abdomen.  Musculoskeletal: Normal range of motion.  Neurological: He appears lethargic. No cranial nerve deficit or sensory deficit. GCS eye subscore is 4. GCS verbal subscore is 5. GCS motor subscore is 6.  No focal weakness.  Equal grip strength.  Skin: Skin is warm. He is diaphoretic.  Psychiatric: He has a normal mood and affect.    ED Course  Procedures (including critical care time) Labs Review Labs Reviewed  GLUCOSE, CAPILLARY - Abnormal; Notable for the following:    Glucose-Capillary 371 (*)    All other components within normal limits  CBC WITH DIFFERENTIAL - Abnormal; Notable for the following:    HCT 38.9 (*)    All other components within normal limits  APTT - Abnormal; Notable for the following:    aPTT 23 (*)    All other components within normal limits  COMPREHENSIVE METABOLIC PANEL - Abnormal; Notable for the following:    Sodium 132 (*)    Chloride 91 (*)    Glucose, Bld 399 (*)    GFR calc non Af Amer 74 (*)    GFR calc Af Amer 85 (*)    All other components within normal limits  PROTIME-INR  TROPONIN I  URINALYSIS, ROUTINE W REFLEX MICROSCOPIC   Imaging Review Ct Head Wo Contrast  01/17/2013   CLINICAL DATA:  Code stroke. Headache and weakness and aphasia  EXAM: CT HEAD WITHOUT CONTRAST  TECHNIQUE: Contiguous axial images were obtained from the base of the skull through the vertex without intravenous contrast.  COMPARISON:  None.  FINDINGS: Ventricle size is normal. Negative for acute infarct, hemorrhage, or mass. No edema or midline shift. Calvarium  intact.  IMPRESSION: Negative  CriticalValue/emergent results were called by telephone at the time of interpretation on 01/17/2013 at 11:25 AMto Dr. Adriana Simas, who verbally acknowledged these results.   Electronically Signed   By: Marlan Palau M.D.   On: 01/17/2013 11:25    MDM   1. Hypotension, unspecified   2. Hyperglycemia without ketosis     Date: 01/17/2013  Rate: 96  Rhythm: normal sinus rhythm  QRS Axis: normal  Intervals: normal  ST/T Wave abnormalities: normal  Conduction Disutrbances:none and nonspecific intraventricular conduction delay  Narrative Interpretation: inferior q waves  Old EKG Reviewed: unchanged   Discussed with Dr. Sherrie Mustache with [facility] who will admit pt. Temp admit orders given.     Burgess Amor, PA-C 01/17/13 1315  Burgess Amor, PA-C 01/17/13 1316  Burgess Amor, PA-C 01/23/13 [phone]

## 2013-01-17 NOTE — ED Provider Notes (Signed)
Medical screening examination/treatment/procedure(s) were conducted as a shared visit with non-physician practitioner(s) and myself.  I personally evaluated the patient during the encounter.   Unexplained near-syncope and hypotension with patient who has multiple risk factors include obesity, diabetes, hypertension. CT head negative.   Admit for hydration and monitoring.  Donnetta Hutching, MD 01/17/13 1535

## 2013-01-18 ENCOUNTER — Encounter (HOSPITAL_COMMUNITY): Payer: Self-pay | Admitting: Internal Medicine

## 2013-01-18 DIAGNOSIS — E871 Hypo-osmolality and hyponatremia: Secondary | ICD-10-CM

## 2013-01-18 DIAGNOSIS — I5189 Other ill-defined heart diseases: Secondary | ICD-10-CM | POA: Diagnosis present

## 2013-01-18 HISTORY — DX: Other ill-defined heart diseases: I51.89

## 2013-01-18 LAB — GLUCOSE, CAPILLARY: Glucose-Capillary: 237 mg/dL — ABNORMAL HIGH (ref 70–99)

## 2013-01-18 LAB — BASIC METABOLIC PANEL
BUN: 13 mg/dL (ref 6–23)
Chloride: 98 mEq/L (ref 96–112)
GFR calc Af Amer: >90 mL/min (ref >90)
Glucose, Bld: 215 mg/dL — ABNORMAL HIGH (ref 70–99)
Potassium: 4 mEq/L (ref 3.5–5.1)
Sodium: 136 mEq/L (ref 135–145)

## 2013-01-18 LAB — CBC
HCT: 37.2 % — ABNORMAL LOW (ref 39.0–52.0)
Hemoglobin: 12.5 g/dL — ABNORMAL LOW (ref 13.0–17.0)
MCH: 29 pg (ref 26.0–34.0)
MCHC: 33.6 g/dL (ref 30.0–36.0)
MCV: 86.3 fL (ref 78.0–100.0)
RBC: 4.31 MIL/uL (ref 4.22–5.81)

## 2013-01-18 LAB — HEMOGLOBIN A1C: Mean Plasma Glucose: 240 mg/dL — ABNORMAL HIGH (ref <117)

## 2013-01-18 LAB — TSH: TSH: 1.215 u[IU]/mL (ref 0.350–4.500)

## 2013-01-18 MED ORDER — INSULIN ASPART 100 UNIT/ML ~~LOC~~ SOLN: 15.0000 [IU] | Freq: Two times a day (BID) | SUBCUTANEOUS | Status: DC

## 2013-01-18 MED ORDER — INSULIN DETEMIR 100 UNIT/ML ~~LOC~~ SOLN: 50.0000 [IU] | Freq: Every day | SUBCUTANEOUS | Status: DC

## 2013-01-18 MED ORDER — METFORMIN HCL 1000 MG PO TABS: 1000.0000 mg | ORAL_TABLET | Freq: Two times a day (BID) | ORAL | Status: DC

## 2013-01-18 MED ORDER — ENALAPRIL MALEATE 10 MG PO TABS: ORAL_TABLET | ORAL | Status: DC

## 2013-01-18 NOTE — Progress Notes (Signed)
DISCHARGE INSTRUCTIONS GIVEN TO PT AND HIS FAMILY ALONG W/ NOTE FROM DR Sherrie Mustache WHEN HE CAN RETURN TO WORK. SHE ALSO INCLUDED AS SS FOR HIS INSULIN. IV'S D/C'D PER PROTOCOL. SKIN WARM AND DRY. VSS. DENIES SOB OR LIGHTHEADEDNESS.

## 2013-01-18 NOTE — Discharge Summary (Signed)
Physician Discharge Summary  Aaron Matthews WUJ:[id number] DOB: 14-Nov-1960 DOA: 01/17/2013  PCP: Cassell Smiles., MD  Admit date: 01/17/2013 Discharge date: 01/18/2013  Time spent: Greater than 30  minutes  Recommendations for Outpatient Follow-up:  1. Assess diabetes control, compliance, and medication management.  Discharge Diagnoses:   1. Presyncope and transient dysarthria secondary to hypotension. 2. Hypotension secondary to hypovolemia/volume repletion in the setting of uncontrolled hyperglycemia and ongoing antihypertensive medication management. (History of hypertension chronically). 3. Uncontrolled type II, insulin requiring diabetes mellitus. Hemoglobin A1c 10.0. 4. Hyponatremia secondary to hypovolemia and hyperglycemia. 5. Diastolic dysfunction per 2-D echocardiogram. 6. Gastroesophageal reflux disease. 7. Mild dilutional anemia. 8. Mild posterior neck pain and headache, MRI of the brain negative for acute stroke or posterior circulation compromise. 9. Morbid obesity. 10. Probable noncompliance.   Discharge Condition: Improved.  Diet recommendation: Heart healthy and carbohydrate modified.  Filed Weights   01/17/13 1506 01/18/13 0500  Weight: 144.8 kg (319 lb 3.6 oz) 150.5 kg (331 lb 12.7 oz)    History of present illness:   Aaron Matthews is a 52 y.o. male with a past medical history that includes hypertension, diabetes, obesity. He presents to the emergency department with chief complaint of sudden lightheadedness and a feeling as if he was going to pass out. The patient indicates that he was walking from the parking lot into work this morning when he suddenly became very lightheaded. He felt as if he was going to pass out. He did not want to fall because he had a replacement last year. Therefore, he sat himself down on the ground. His coworkers called 911 but the patient refused to go to medical facility in [place], [place]. So, his family transported him to [facility]. During this episode the patient denies any chest pain, palpitation ,visual disturbances, nausea, or vomiting. Associated symptoms include paleness and diaphoresis according to coworkers. The patient's wife also reports that patient had slurred speech when she first spoke to him immediately after the event on the telephone. Patient denies any fever, chills, dysuria, hematuria, frequency or urgency. He denies any abdominal pain constipation, diarrhea, or melena. He has been eating and drinking his normal amount. He has made no changes in his medications. He does not check his blood sugars on a daily basis. He does indicate for the last 7 days he has had some left neck discomfort which he describes as "felt like I slept well". Associated with this pain is an intermittent posterior headache that radiates to the right greater than left side of the neck. His workup in the emergency department is significant for a blood pressure of 70/44 and heart rate of 98. Lab data are significant for a sodium of 132, chloride 91 and serum glucose of 399. Urinalysis yields trace ketones, glucose, and greater than 1.03 specific gravity. CT of the head was negative and chest x-ray yields no active disease. EKG yields inferior infarct age undetermined. Compared to EKG in October 2013, inferior infarct is now present. In the emergency room he was given 2 L of normal saline. At the time of my exam, his systolic blood pressure is 113 and heart rate 96. CBG is now 264. Hospitalists were asked to admit.     Hospital Course:   1. Near syncope/presyncope secondary to hypotension as a consequence of dehydration/volume depletion. The patient was started on vigorous IV fluids. His antihypertensive medication was withheld. For further evaluation, a number of studies were ordered. His cardiac enzymes were within normal limits.  His 2-D echocardiogram revealed preserved left ventricle systolic function with ejection fraction of  60-65% and Doppler parameters consistent with grade 1 diastolic dysfunction. There was no evidence of congestive heart failure on exam. His TSH was within normal limits at 1.212. MRI of the brain revealed no evidence of acute stroke or posterior circulation compromise. Following volume repletion, the patient had no further complaints of dizziness or presyncope. His transient dysarthria resolved.  Hypotension. His urine specific gravity was greater than 1.030. His blood glucose on admission was almost 400. He takes enalapril twice daily for treatment of hypertension. His hypotension was secondary to hypovolemia/dehydration. Enalapril was withheld. His blood pressure improved. At the time of discharge, he was instructed to restart Vasotec once daily but to take the second dose of enalapril in the afternoon or evening if his systolic blood pressure was greater than 120. He was also instructed to drink 2-3 quarts of fluids daily to avoid dehydration. He and his wife voiced understanding with these instructions.  Hyponatremia. This was secondary to hypovolemia. With normal saline, his serum sodium improved and normalized to 136.  Uncontrolled type 2 diabetes mellitus. The patient's home regimen include metformin, sliding scale NovoLog, and Levemir. However, the patient acknowledged that he did not check his blood sugars at home at all. During the hospitalization, he was started on sliding scale NovoLog and Levemir at lower than home dosing. His blood glucose was relatively better controlled in the hospital on less insulin, and therefore, it is likely that he had not been compliant with medication management. His hemoglobin A1c was 10.0, indicating poor outpatient control. He was advised and encouraged to check his blood sugars at home at least twice daily. At the time of discharge, he was given a modified sliding scale NovoLog regimen. He was maintained on his usual home dose of Levemir and  metformin.      Procedures:  2-D echocardiogram-Study Conclusions  - Procedure narrative: Transthoracic echocardiography. Image quality was suboptimal. The study was technically difficult, as a result of poor sound wave transmission and body habitus. - Left ventricle: Wall thickness was increased in a pattern of moderate LVH. Systolic function was normal. The estimated ejection fraction was in the range of 60% to 65%. Wall motion was normal; there were no regional wall motion abnormalities. There was an increased relative contribution of atrial contraction to ventricular filling. Doppler parameters are consistent with abnormal left ventricular relaxation (grade 1 diastolic dysfunction). Transthoracic echocardiography. M-mode, complete 2D, spectral Doppler, and color Doppler. Height: Height: 185.4cm. Height: 73in. Weight: Weight: 144.7kg. Weight: 318.3lb. Body mass index: BMI: 42.1kg/m^2. Body surface area: BSA: 2.99m^2. Blood pressure: 107/63. Patient status: Inpatient. Location: ICU/CCU   Consultations:  None  Discharge Exam: Filed Vitals:   01/18/13 0800  BP: 132/110  Pulse: 95  Temp:   Resp: 19   temperature 98.2  General: Pleasant alert obese 52 year old man in no acute distress. Cardiovascular: S1, S2, with no murmurs rubs or gallops. Respiratory: Clear to auscultation bilaterally. Neurologic: He is alert and oriented x3. Cranial nerves II through XII are intact.  Discharge Instructions  Discharge Orders   Future Orders Complete By Expires   Diet - low sodium heart healthy  As directed    Diet Carb Modified  As directed    Discharge instructions  As directed    Comments:     Check your blood sugars at home consistently twice daily. Drink at least 2-3 quarts of fluids daily.   Increase activity slowly  As directed  Medication List         enalapril 10 MG tablet  Commonly known as:  VASOTEC  Take 1 tablet daily in the morning and take the  second tablet in the afternoon/evening if your systolic blood pressure (top number) is 120 or above.     insulin aspart 100 UNIT/ML injection  Commonly known as:  novoLOG  Inject 15-19 Units into the skin 2 (two) times daily before a meal. Blood sugar of ...120-150 = 5 units;  151-200= 10 units;  201-250 = 13 units;  251-300 = 15 units;  301-350 = 18 units;  351 to 400 or greater = 20 units. If your blood sugar is greater 400, call your doctor.     insulin detemir 100 UNIT/ML injection  Commonly known as:  LEVEMIR  Inject 0.5 mLs (50 Units total) into the skin at bedtime.     metFORMIN 1000 MG tablet  Commonly known as:  GLUCOPHAGE  Take 1 tablet (1,000 mg total) by mouth 2 (two) times daily with a meal. Do not take this medication if your blood sugar is less than 120.       Allergies  Allergen Reactions  . Penicillins Swelling and Rash       Follow-up Information   Follow up with Cassell Smiles., MD. Schedule an appointment as soon as possible for a visit in 5 days.   Specialty:  Internal Medicine   Contact information:   [address] [place] [address] [phone]        The results of significant diagnostics from this hospitalization (including imaging, microbiology, ancillary and laboratory) are listed below for reference.    Significant Diagnostic Studies: Ct Head Wo Contrast  01/17/2013   CLINICAL DATA:  Code stroke. Headache and weakness and aphasia  EXAM: CT HEAD WITHOUT CONTRAST  TECHNIQUE: Contiguous axial images were obtained from the base of the skull through the vertex without intravenous contrast.  COMPARISON:  None.  FINDINGS: Ventricle size is normal. Negative for acute infarct, hemorrhage, or mass. No edema or midline shift. Calvarium intact.  IMPRESSION: Negative  CriticalValue/emergent results were called by telephone at the time of interpretation on 01/17/2013 at 11:25 AMto Dr. Adriana Simas, who verbally acknowledged these results.    Electronically Signed   By: Marlan Palau M.D.   On: 01/17/2013 11:25   Mr Laqueta Jean WG Contrast  01/18/2013   *RADIOLOGY REPORT*  Clinical Data: Slurred speech, stroke.  MRI HEAD WITHOUT AND WITH CONTRAST  Technique:  Multiplanar, multiecho pulse sequences of the brain and surrounding structures were obtained according to standard protocol without and with intravenous contrast  Contrast: 20mL MULTIHANCE GADOBENATE DIMEGLUMINE 529 MG/ML IV SOLN  Comparison: CT of head January 17, 2013 at 03/14/1939.  Findings: No reduced diffusion to suggest acute ischemia.  No susceptibility artifact to suggest hemorrhage.  The ventricles and sulci are normal for patient's age.  A few scattered punctate T2 hyperintensities are less than expected for age in a nonspecific could reflect a sequelae of chronic small vessel ischemic disease.  No midline shift, mass lesions, mass effect nor abnormal parenchymal enhancement.  No abnormal extra-axial fluid collections, extra-axial masses.  No abnormal leptomeningeal enhancement.  Normal major intracranial vascular flow voids observed at the skull base.  The sella is not expanded.  No suspicious calvarial bone marrow signal.  Craniocervical junction maintained.  Mild paranasal sinus mucosal thickening without air fluid levels.  The patient appears edentulous.  Mastoid air cells are well-aerated.  Mild temporomandibular osteoarthrosis.  IMPRESSION: No acute intracranial process, specifically no acute ischemia. Normal MRI of the brain with and without contrast for age.   Original Report Authenticated By: Awilda Metro   Dg Chest Port 1 View  01/17/2013   CLINICAL DATA:  Hypotension.  EXAM: PORTABLE CHEST - 1 VIEW  COMPARISON:  01/25/2012  FINDINGS: The heart size and mediastinal contours are within normal limits. Both lungs are clear. The visualized skeletal structures are unremarkable.  IMPRESSION: No active disease.   Electronically Signed   By: Myles Rosenthal   On: 01/17/2013 14:14     Microbiology: Recent Results (from the past 240 hour(s))  MRSA PCR SCREENING     Status: None   Collection Time    01/17/13  3:40 PM      Result Value Range Status   MRSA by PCR NEGATIVE  NEGATIVE Final   Comment:            The GeneXpert MRSA Assay (FDA     approved for NASAL specimens     only), is one component of a     comprehensive MRSA colonization     surveillance program. It is not     intended to diagnose MRSA     infection nor to guide or     monitor treatment for     MRSA infections.     Labs: Basic Metabolic Panel:  Recent Labs Lab 01/17/13 1107 01/18/13 0541  NA 132* 136  K 4.1 4.0  CL 91* 98  CO2 23 28  GLUCOSE 399* 215*  BUN 19 13  CREATININE 1.13 0.89  CALCIUM 9.8 9.1   Liver Function Tests:  Recent Labs Lab 01/17/13 1107  AST 27  ALT 26  ALKPHOS 54  BILITOT 0.8  PROT 6.7  ALBUMIN 4.2   No results found for this basename: LIPASE, AMYLASE,  in the last 168 hours No results found for this basename: AMMONIA,  in the last 168 hours CBC:  Recent Labs Lab 01/17/13 1107 01/18/13 0541  WBC 6.6 5.3  NEUTROABS 4.4  --   HGB 13.4 12.5*  HCT 38.9* 37.2*  MCV 84.7 86.3  PLT 186 190   Cardiac Enzymes:  Recent Labs Lab 01/17/13 1107 01/17/13 1815 01/17/13 2256  TROPONINI <0.30 <0.30 <0.30   BNP: BNP (last 3 results) No results found for this basename: PROBNP,  in the last 8760 hours CBG:  Recent Labs Lab 01/17/13 1057 01/17/13 1441 01/17/13 1629 01/17/13 2107 01/18/13 0731  GLUCAP 371* 264* 235* 189* 237*       Signed:  Aryka Coonradt  [facility] 01/18/2013, 9:31 AM

## 2013-01-22 NOTE — Progress Notes (Signed)
UR Chart Review Completed  

## 2013-01-24 NOTE — ED Provider Notes (Signed)
Medical screening examination/treatment/procedure(s) were conducted as a shared visit with non-physician practitioner(s) and myself.  I personally evaluated the patient during the encounter.  Sudden onset weakness, lightheadedness, or syncope at work this morning.  Questionable slurred speech.  Pale and diaphoretic per coworkers.  Admit  Donnetta Hutching, MD 01/24/13 1520

## 2013-08-19 IMAGING — CR DG HIP 1V PORT*L*
1 series · 2 of 2 positions shown · non-contrast
Comparison: None.

CLINICAL DATA: Postop left hip.

PORTABLE PELVIS,PORTABLE LEFT HIP - 1 VIEW

[Series 1: lateral (frog) · left · 2 of 2 slices shown]
[im 1/2]
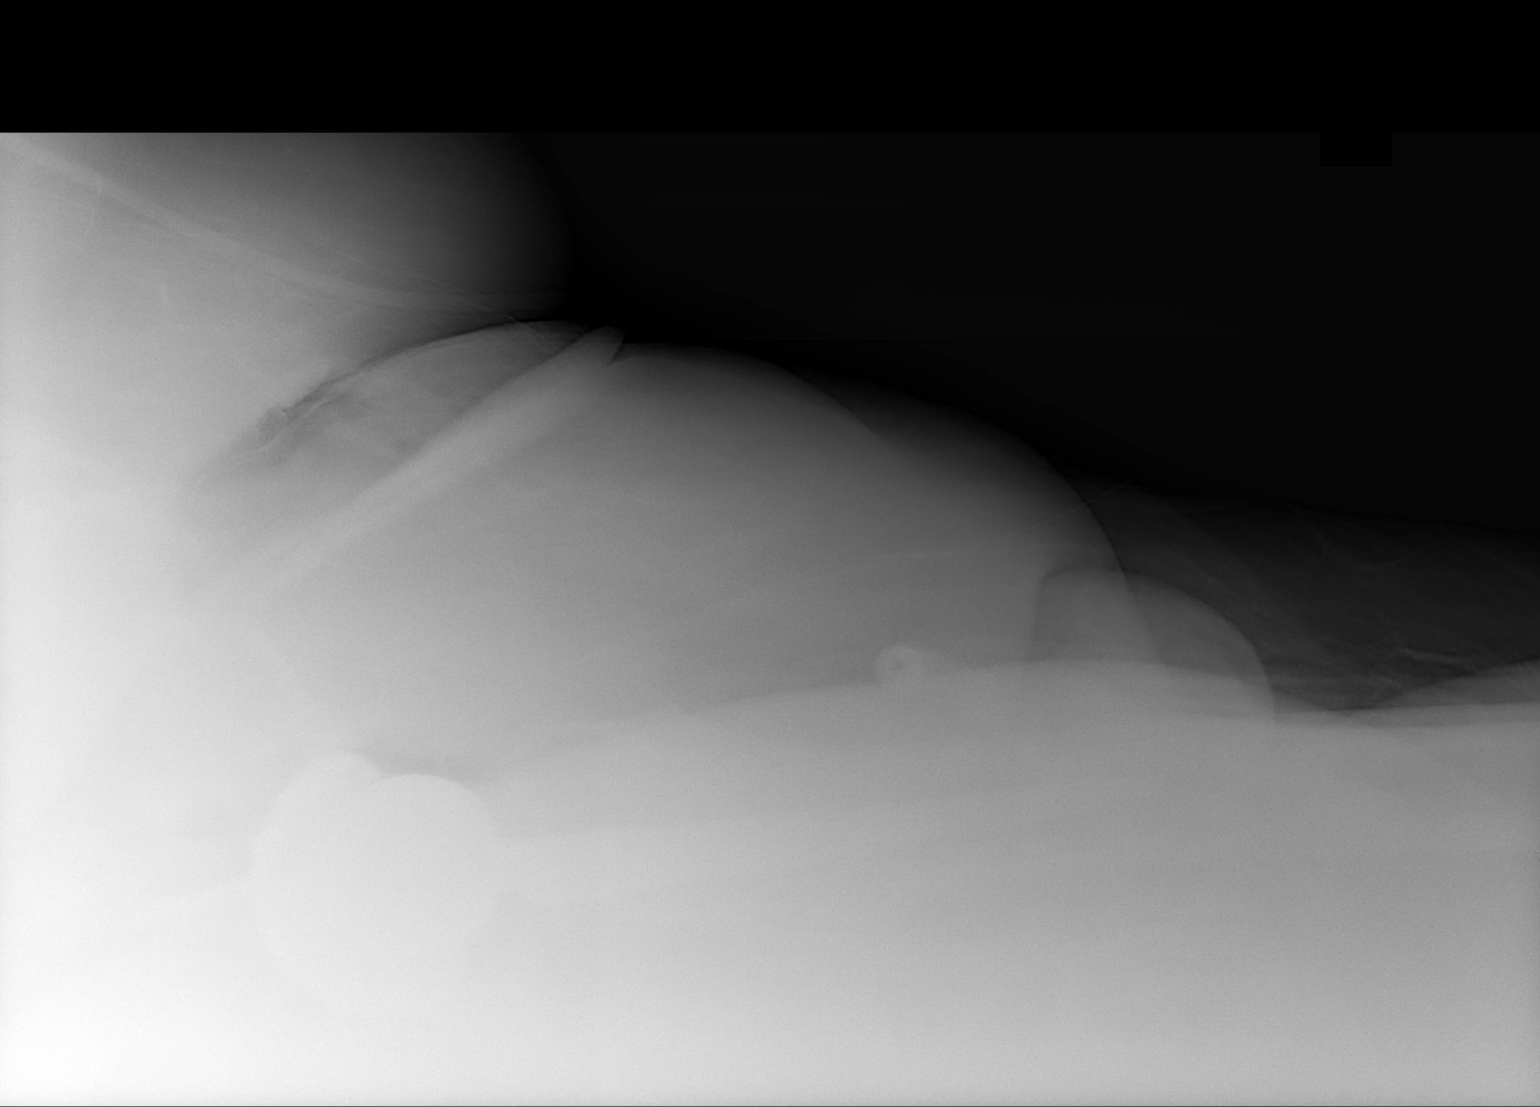
[im 2/2]
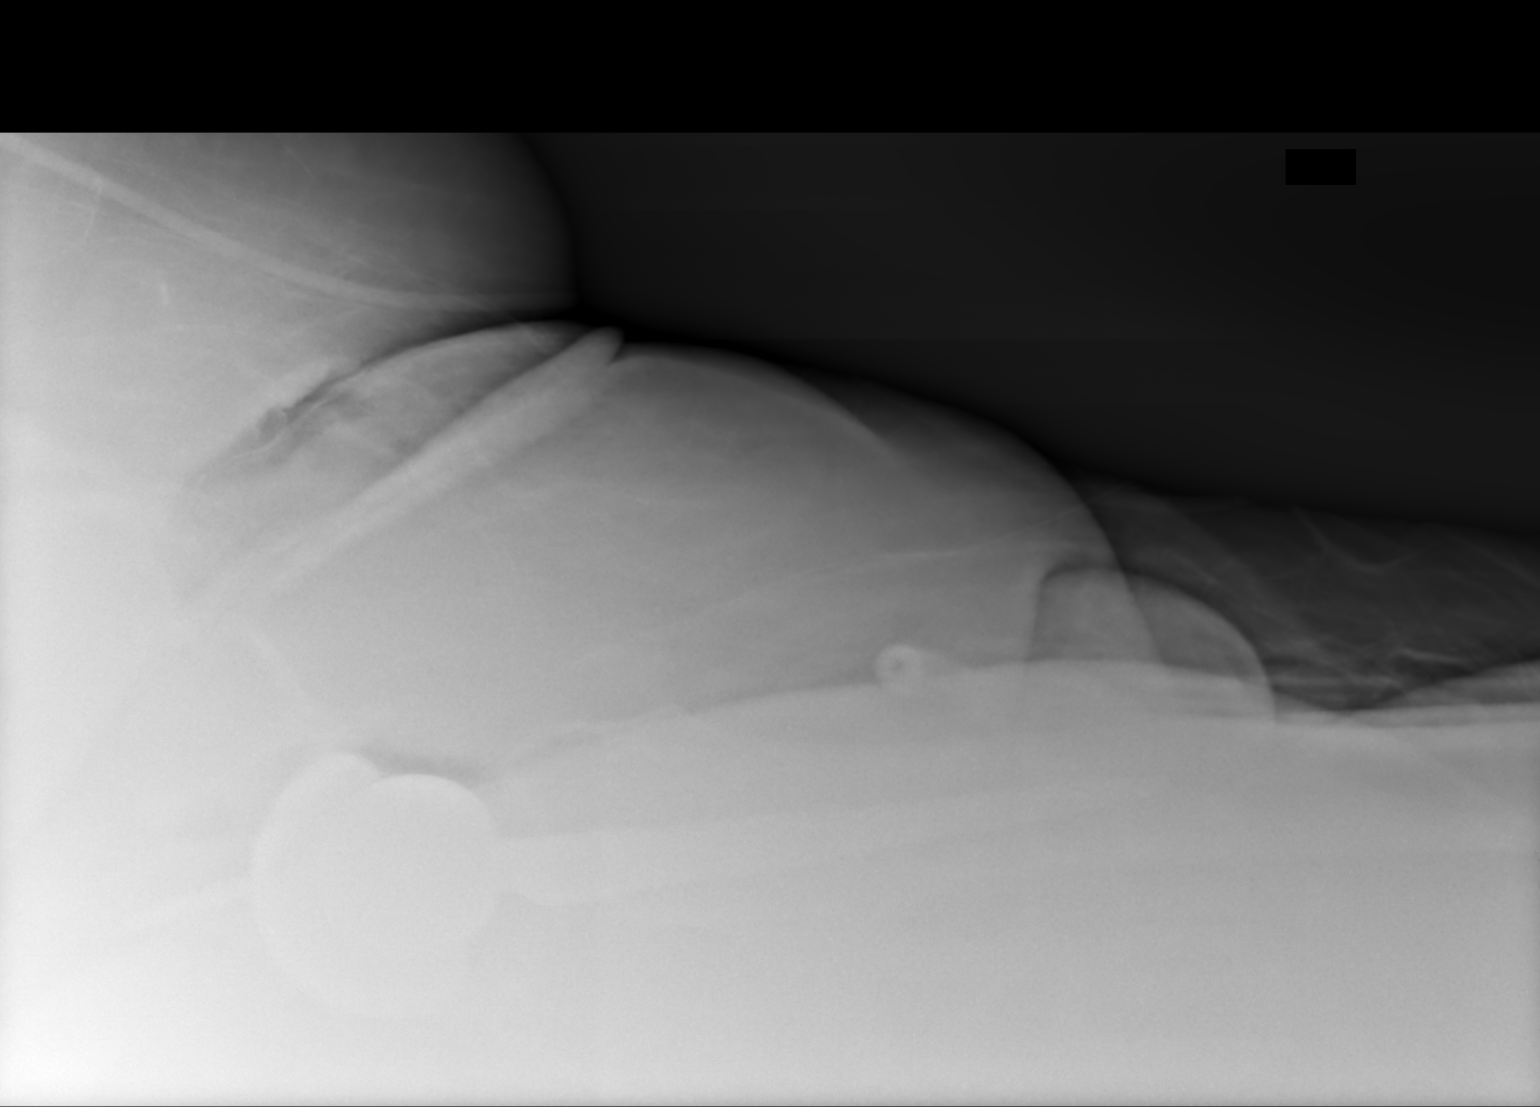

[2 of 2 positions shown; findings below may reference images not displayed]

FINDINGS: Lateral view is limited.  Post left total hip
replacement.  The acetabular screw approaches the inner cortex of
the ilium but does not appear to of breech such on this single
projection.  No surrounding fracture noted on the AP view.
Surgical drain is in place.
IMPRESSION: Lateral view is limited.  Post left total hip replacement.  The
acetabular screw approaches the inner cortex of the ilium but does
not appear to of breech such on this single projection.  No
surrounding fracture noted on the AP view.

## 2013-08-19 IMAGING — CR DG PORTABLE PELVIS
1 series · 1 of 1 positions shown · non-contrast
Comparison: None.

CLINICAL DATA: Postop left hip.

PORTABLE PELVIS,PORTABLE LEFT HIP - 1 VIEW

[AP]
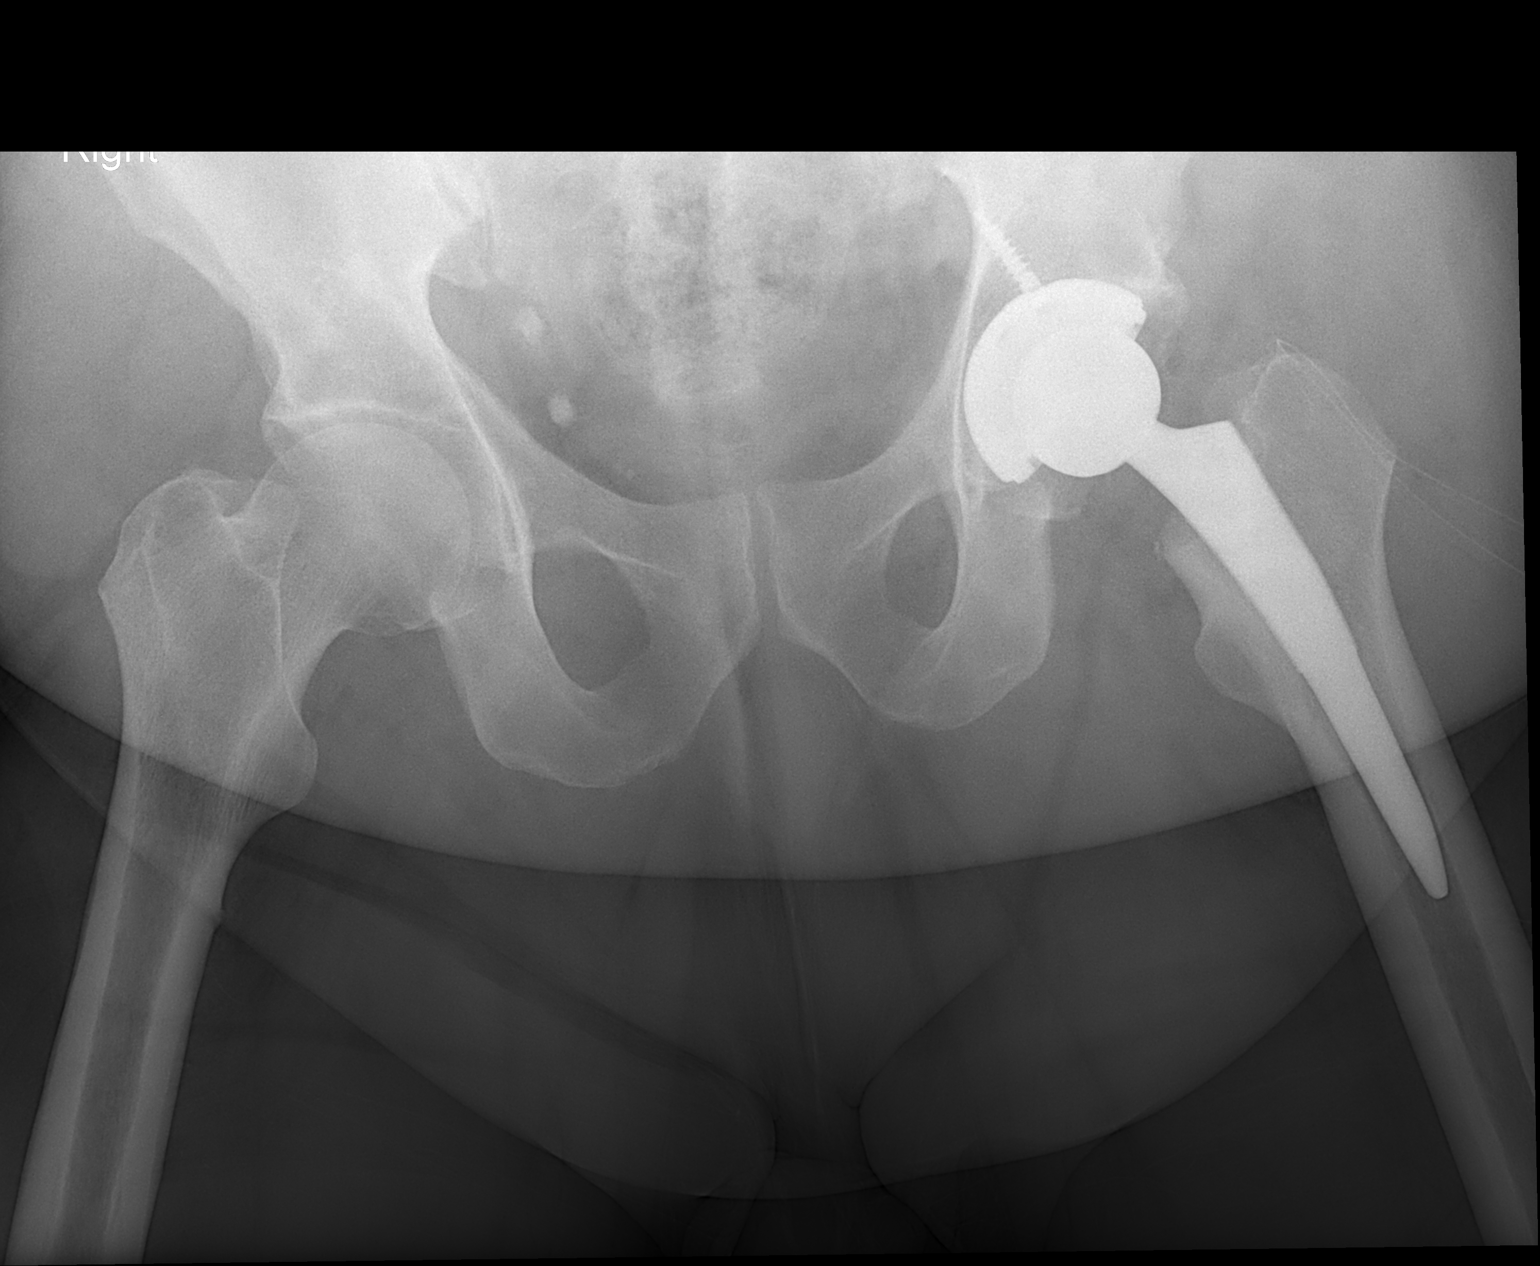

[1 of 1 positions shown; findings below may reference images not displayed]

FINDINGS: Lateral view is limited.  Post left total hip
replacement.  The acetabular screw approaches the inner cortex of
the ilium but does not appear to of breech such on this single
projection.  No surrounding fracture noted on the AP view.
Surgical drain is in place.
IMPRESSION: Lateral view is limited.  Post left total hip replacement.  The
acetabular screw approaches the inner cortex of the ilium but does
not appear to of breech such on this single projection.  No
surrounding fracture noted on the AP view.

## 2016-02-16 DIAGNOSIS — M545 Low back pain: Secondary | ICD-10-CM | POA: Diagnosis not present

## 2016-02-16 DIAGNOSIS — E1140 Type 2 diabetes mellitus with diabetic neuropathy, unspecified: Secondary | ICD-10-CM | POA: Diagnosis not present

## 2016-02-16 DIAGNOSIS — M62830 Muscle spasm of back: Secondary | ICD-10-CM | POA: Diagnosis not present

## 2016-02-16 DIAGNOSIS — N529 Male erectile dysfunction, unspecified: Secondary | ICD-10-CM | POA: Diagnosis not present

## 2016-02-16 DIAGNOSIS — Z1389 Encounter for screening for other disorder: Secondary | ICD-10-CM | POA: Diagnosis not present

## 2016-02-16 DIAGNOSIS — Z6841 Body Mass Index (BMI) 40.0 and over, adult: Secondary | ICD-10-CM | POA: Diagnosis not present

## 2016-04-16 ENCOUNTER — Emergency Department (HOSPITAL_COMMUNITY)
Admission: EM | Admit: 2016-04-16 | Discharge: 2016-04-16 | Disposition: A | Discharge status: Home / Self Care | Payer: BLUE CROSS/BLUE SHIELD | Attending: Emergency Medicine | Admitting: Emergency Medicine

## 2016-04-16 ENCOUNTER — Encounter (HOSPITAL_COMMUNITY): Payer: Self-pay | Admitting: Emergency Medicine

## 2016-04-16 ENCOUNTER — Emergency Department (HOSPITAL_COMMUNITY): Payer: BLUE CROSS/BLUE SHIELD

## 2016-04-16 DIAGNOSIS — Z87891 Personal history of nicotine dependence: Secondary | ICD-10-CM | POA: Insufficient documentation

## 2016-04-16 DIAGNOSIS — Z794 Long term (current) use of insulin: Secondary | ICD-10-CM | POA: Diagnosis not present

## 2016-04-16 DIAGNOSIS — R05 Cough: Secondary | ICD-10-CM | POA: Diagnosis not present

## 2016-04-16 DIAGNOSIS — I10 Essential (primary) hypertension: Secondary | ICD-10-CM | POA: Insufficient documentation

## 2016-04-16 DIAGNOSIS — J40 Bronchitis, not specified as acute or chronic: Secondary | ICD-10-CM | POA: Diagnosis not present

## 2016-04-16 DIAGNOSIS — Z87892 Personal history of anaphylaxis: Secondary | ICD-10-CM | POA: Diagnosis not present

## 2016-04-16 DIAGNOSIS — J029 Acute pharyngitis, unspecified: Secondary | ICD-10-CM | POA: Diagnosis not present

## 2016-04-16 DIAGNOSIS — Z79899 Other long term (current) drug therapy: Secondary | ICD-10-CM | POA: Diagnosis not present

## 2016-04-16 DIAGNOSIS — J208 Acute bronchitis due to other specified organisms: Secondary | ICD-10-CM | POA: Diagnosis not present

## 2016-04-16 DIAGNOSIS — E119 Type 2 diabetes mellitus without complications: Secondary | ICD-10-CM | POA: Insufficient documentation

## 2016-04-16 DIAGNOSIS — J028 Acute pharyngitis due to other specified organisms: Secondary | ICD-10-CM

## 2016-04-16 LAB — CBC WITH DIFFERENTIAL/PLATELET
Basophils Absolute: 0 10*3/uL (ref 0.0–0.1)
Basophils Relative: 0 %
Eosinophils Absolute: 0.1 10*3/uL (ref 0.0–0.7)
Eosinophils Relative: 1 %
HCT: 45.6 % (ref 39.0–52.0)
HEMOGLOBIN: 14.5 g/dL (ref 13.0–17.0)
LYMPHS PCT: 14 %
Lymphs Abs: 1.4 10*3/uL (ref 0.7–4.0)
MCH: 28.4 pg (ref 26.0–34.0)
MCHC: 31.8 g/dL (ref 30.0–36.0)
MCV: 89.4 fL (ref 78.0–100.0)
Monocytes Absolute: 1.1 10*3/uL — ABNORMAL HIGH (ref 0.1–1.0)
Monocytes Relative: 11 %
Neutro Abs: 7.5 10*3/uL (ref 1.7–7.7)
Neutrophils Relative %: 74 %
Platelets: 179 10*3/uL (ref 150–400)
RBC: 5.1 MIL/uL (ref 4.22–5.81)
RDW: 13.4 % (ref 11.5–15.5)
WBC: 10.1 10*3/uL (ref 4.0–10.5)

## 2016-04-16 LAB — BASIC METABOLIC PANEL
Anion gap: 13 (ref 5–15)
BUN: 17 mg/dL (ref 6–20)
CHLORIDE: 102 mmol/L (ref 101–111)
CO2: 22 mmol/L (ref 22–32)
Calcium: 9.4 mg/dL (ref 8.9–10.3)
Creatinine, Ser: 1.02 mg/dL (ref 0.61–1.24)
GFR calc Af Amer: >60 mL/min (ref >60)
GFR calc non Af Amer: >60 mL/min (ref >60)
GLUCOSE: 193 mg/dL — AB (ref 65–99)
POTASSIUM: 4.3 mmol/L (ref 3.5–5.1)
Sodium: 137 mmol/L (ref 135–145)

## 2016-04-16 LAB — CBG MONITORING, ED: Glucose-Capillary: 212 mg/dL — ABNORMAL HIGH (ref 65–99)

## 2016-04-16 MED ORDER — SODIUM CHLORIDE 0.9 % IV SOLN
1000.0000 mL | INTRAVENOUS | Status: DC
  Administered 2016-04-16: 1000 mL via INTRAVENOUS

## 2016-04-16 MED ORDER — IPRATROPIUM-ALBUTEROL 0.5-2.5 (3) MG/3ML IN SOLN
3.0000 mL | Freq: Once | RESPIRATORY_TRACT | Status: AC
  Administered 2016-04-16: 3 mL via RESPIRATORY_TRACT
  Filled 2016-04-16: qty 3

## 2016-04-16 MED ORDER — CLINDAMYCIN HCL 300 MG PO CAPS: ORAL_CAPSULE | ORAL | 0 refills | Status: DC

## 2016-04-16 MED ORDER — SODIUM CHLORIDE 0.9 % IV SOLN
1000.0000 mL | Freq: Once | INTRAVENOUS | Status: AC
  Administered 2016-04-16: 1000 mL via INTRAVENOUS

## 2016-04-16 MED ORDER — METHYLPREDNISOLONE SODIUM SUCC 125 MG IJ SOLR
125.0000 mg | Freq: Once | INTRAMUSCULAR | Status: AC
  Administered 2016-04-16: 125 mg via INTRAVENOUS
  Filled 2016-04-16: qty 2

## 2016-04-16 MED ORDER — ALBUTEROL SULFATE HFA 108 (90 BASE) MCG/ACT IN AERS: 2.0000 | INHALATION_SPRAY | Freq: Four times a day (QID) | RESPIRATORY_TRACT | 0 refills | Status: DC | PRN

## 2016-04-16 NOTE — ED Triage Notes (Signed)
Pt reports sore throat and cough since Thursday. Pt reports has not been able to eat anything due to pain x3 days. Pt reports increased weakness.

## 2016-04-16 NOTE — ED Notes (Signed)
Pt room air saturation 89%. Pt placed on 2 liters with no improvement in o2 saturation. Pt placed on 3 liters, o2 saturation 92%.

## 2016-04-16 NOTE — Discharge Instructions (Signed)
Your oxygen level ranges between 89 and 95. Your chest x-ray shows bronchitis, but no pneumonia or other lung emergency's. Please use 2 puffs of albuterol every 4 hours to assist with breathing. Please use saline nasal spray, and or Afrin spray to assist with nasal congestion. Please use clindamycin 2 times daily with food. Salt water gargles maybe helpful. Chloraseptic spray may also be helpful. Please use Tylenol extra strength every 4 hours for pain and discomfort. It is important that you increase liquids. Wash hands frequently. Use a mask and to your symptoms have resolved.

## 2016-04-16 NOTE — ED Provider Notes (Signed)
[facility]PT Provider Note   CSN: [id number] Arrival date & time: 04/16/16  [id number]     History   Chief Complaint Chief Complaint  Patient presents with  . Sore Throat    HPI Aaron Matthews is a 55 y.o. male.  Patient is a 55 year old male who presents to the emergency department with a complaint of cough and sore throat.  Patient gives a three-day history of increasing cough and increasing sore throat. He states that he's not been able to eat much over these past 3 days. He is usually very active, and has not been feeling up to playing golf or doing many of the usual things that he usually does. He states that when he coughs he brings up some yellowish-looking phlegm. He's had some nasal congestion present. He's had some body aches, but is not sure about.temperature elevation. It is of note that he is a diabetic. And he presents now for evaluation of these symptoms given his history of other medical problems.   The history is provided by the patient.    Past Medical History:  Diagnosis Date  . Bursitis    right shoulder  . Diabetes mellitus   . Diastolic dysfunction 01/18/2013   Grade 1  . GERD (gastroesophageal reflux disease)   . Heart murmur    as achild  . Hypertension     Patient Active Problem List   Diagnosis Date Noted  . Diastolic dysfunction 01/18/2013  . Pre-syncope 01/17/2013  . Hypotension 01/17/2013  . Metabolic acidosis 01/17/2013  . Diabetes (HCC) 01/17/2013  . Neck pain 01/17/2013  . Hypertension   . GERD (gastroesophageal reflux disease)   . Expected blood loss anemia 01/31/2012  . Morbid obesity (HCC) 01/31/2012  . Hyponatremia 01/31/2012  . Hyperglycemia 01/31/2012  . S/P left THA, AA 01/30/2012    Past Surgical History:  Procedure Laterality Date  . CHOLECYSTECTOMY    . dental extraction     complete  . TONSILLECTOMY    . TOTAL HIP ARTHROPLASTY  01/30/2012   Procedure: TOTAL HIP ARTHROPLASTY ANTERIOR APPROACH;  Surgeon: Shelda PalMatthew D  Olin, MD;  Location: [facility];  Service: Orthopedics;  Laterality: Left;       Home Medications    Prior to Admission medications   Medication Sig Start Date End Date Taking? Authorizing Provider  enalapril (VASOTEC) 10 MG tablet Take 1 tablet daily in the morning and take the second tablet in the afternoon/evening if your systolic blood pressure (top number) is 120 or above. 01/18/13  Yes Elliot Cousinenise Fisher, MD  HUMALOG KWIKPEN 100 UNIT/ML KiwkPen Inject 15-20 Units into the muscle 2 (two) times daily. 04/07/16  Yes Historical Provider, MD  insulin detemir (LEVEMIR) 100 UNIT/ML injection Inject 0.5 mLs (50 Units total) into the skin at bedtime. 01/18/13  Yes Elliot Cousinenise Fisher, MD  INVOKANA 300 MG TABS tablet Take 1 tablet by mouth daily. 04/07/16  Yes Historical Provider, MD  LEVEMIR FLEXTOUCH 100 UNIT/ML Pen Inject 30 Units as directed daily. 03/24/16  Yes Historical Provider, MD  losartan (COZAAR) 25 MG tablet Take 1 tablet by mouth daily. 04/07/16  Yes Historical Provider, MD  metFORMIN (GLUCOPHAGE) 1000 MG tablet Take 1 tablet (1,000 mg total) by mouth 2 (two) times daily with a meal. Do not take this medication if your blood sugar is less than 120. 01/18/13  Yes Elliot Cousinenise Fisher, MD  omeprazole (PRILOSEC) 20 MG capsule Take 20 mg by mouth daily.   Yes Historical Provider, MD  albuterol (PROVENTIL HFA;VENTOLIN HFA) 108 (  90 Base) MCG/ACT inhaler Inhale 2 puffs into the lungs every 6 (six) hours as needed for shortness of breath. 04/16/16   Ivery QualeHobson Javante Nilsson, PA-C  clindamycin (CLEOCIN) 300 MG capsule 1 po bid with food 04/16/16   Ivery QualeHobson Ladislav Caselli, PA-C    Family History History reviewed. No pertinent family history.  Social History Social History  Substance Use Topics  . Smoking status: Former Smoker    Packs/day: 1.00    Years: 10.00    Types: Cigarettes    Quit date: 01/24/1993  . Smokeless tobacco: Never Used  . Alcohol use No     Allergies   Penicillins   Review of Systems Review of Systems    Constitutional: Positive for activity change and appetite change. Negative for chills and fever.       All ROS Neg except as noted in HPI  HENT: Positive for congestion and sore throat. Negative for nosebleeds.   Eyes: Negative for photophobia and discharge.  Respiratory: Positive for cough. Negative for shortness of breath and wheezing.   Cardiovascular: Negative for chest pain and palpitations.  Gastrointestinal: Negative for abdominal pain, blood in stool, diarrhea, nausea and vomiting.  Genitourinary: Negative for dysuria, frequency and hematuria.  Musculoskeletal: Negative for arthralgias, back pain and neck pain.  Skin: Negative.   Neurological: Negative for dizziness, seizures and speech difficulty.  Psychiatric/Behavioral: Negative for confusion and hallucinations.     Physical Exam Updated Vital Signs BP 151/86   Pulse 105   Temp 98.4 F (36.9 C) (Oral)   Resp 18   Ht 6\' 1"  (1.854 m)   Wt 136.1 kg   SpO2 92%   BMI 39.58 kg/m   Physical Exam  Constitutional: He is oriented to person, place, and time. He appears well-developed and well-nourished.  Non-toxic appearance.  HENT:  Head: Normocephalic.  Right Ear: Tympanic membrane and external ear normal.  Left Ear: Tympanic membrane and external ear normal.  Nasal congestion present.  There is some increased redness of the posterior pharynx. The uvula is in the midline. The airway is patent.  Questionable fruity smell on breath.  Eyes: EOM and lids are normal. Pupils are equal, round, and reactive to light.  Neck: Normal range of motion. Neck supple. Carotid bruit is not present.  Cardiovascular: Normal rate, regular rhythm, normal heart sounds, intact distal pulses and normal pulses.   Pulmonary/Chest: Breath sounds normal. No respiratory distress.  Course breath sounds present. Few scattered rhonchi. No wheezes noted. Patient is not using accessory muscles. Patient speaks in complete sentences.  Abdominal: Soft.  Bowel sounds are normal. There is no tenderness. There is no guarding.  Musculoskeletal: Normal range of motion.  Lymphadenopathy:       Head (right side): No submandibular adenopathy present.       Head (left side): No submandibular adenopathy present.    He has no cervical adenopathy.  Neurological: He is alert and oriented to person, place, and time. He has normal strength. No cranial nerve deficit or sensory deficit.  Skin: Skin is warm and dry.  Psychiatric: He has a normal mood and affect. His speech is normal.  Nursing note and vitals reviewed.    ED Treatments / Results  Labs (all labs ordered are listed, but only abnormal results are displayed) Labs Reviewed  CBC WITH DIFFERENTIAL/PLATELET - Abnormal; Notable for the following:       Result Value   Monocytes Absolute 1.1 (*)    All other components within normal limits  BASIC METABOLIC  PANEL - Abnormal; Notable for the following:    Glucose, Bld 193 (*)    All other components within normal limits  CBG MONITORING, ED - Abnormal; Notable for the following:    Glucose-Capillary 212 (*)    All other components within normal limits    EKG  EKG Interpretation None       Radiology Dg Chest 2 View  Result Date: 04/16/2016 CLINICAL DATA:  Sore throat and cough since Thursday, not able to eat anything due to pain for 3 days, increased weakness, history hypertension, diabetes mellitus, GERD EXAM: CHEST  2 VIEW COMPARISON:  01/18/2016 FINDINGS: Upper normal heart size. Mediastinal contours and pulmonary vascularity normal. Bronchitic changes with bibasilar atelectasis. No acute infiltrate, pleural effusion or pneumothorax. Bones demineralized. IMPRESSION: Bronchitic changes with bibasilar atelectasis. Electronically Signed   By: Ulyses Southward M.D.   On: 04/16/2016 08:57    Procedures Procedures (including critical care time)  Medications Ordered in ED Medications  0.9 %  sodium chloride infusion (0 mLs Intravenous Stopped  04/16/16 1138)    Followed by  0.9 %  sodium chloride infusion (0 mLs Intravenous Stopped 04/16/16 1138)  ipratropium-albuterol (DUONEB) 0.5-2.5 (3) MG/3ML nebulizer solution 3 mL (3 mLs Nebulization Given 04/16/16 1003)  methylPREDNISolone sodium succinate (SOLU-MEDROL) 125 mg/2 mL injection 125 mg (125 mg Intravenous Given 04/16/16 1005)     Initial Impression / Assessment and Plan / ED Course  I have reviewed the triage vital signs and the nursing notes.  Pertinent labs & imaging results that were available during my care of the patient were reviewed by me and considered in my medical decision making (see chart for details).  Clinical Course     **I have reviewed nursing notes, vital signs, and all appropriate lab and imaging results for this patient.  Final Clinical Impressions(s) / ED Diagnoses  The vital signs are most within normal limits. The pulse oximetry dropped down to 88 or 89 while the patient was being evaluated. Patient was placed on oxygen by nasal cannula, and came up to 96.  Chest x-ray shows bronchitis, no pneumonia noted. Complete blood count and basic metabolic panel within normal limits with exception of the glucose being elevated.   I ambulated the patient in the room and Hall without desaturation. However after sitting for a while and carrying on normal conversation his pulse oximetry did come down to 90 on room air. The examination and lab and x-rays seem to indicate bronchitis and upper respiratory infection. The patient will be treated with albuterol inhaler 2 puffs every 4 hours. Clindamycin 2 times daily. Salt water gargles and Chloraseptic Spray for throat discomfort. Increasing fluids. We discussed the importance of good handwashing and using his mask until symptoms have resolved. The patient is also advised to monitor his glucose very closely. Patient and patient's wife in agreement with this plan.    Final diagnoses:  Acute bronchitis due to other specified  organisms  Pharyngitis due to other organism    New Prescriptions Discharge Medication List as of 04/16/2016 11:31 AM    START taking these medications   Details  albuterol (PROVENTIL HFA;VENTOLIN HFA) 108 (90 Base) MCG/ACT inhaler Inhale 2 puffs into the lungs every 6 (six) hours as needed for shortness of breath., Starting Sun 04/16/2016, Print    clindamycin (CLEOCIN) 300 MG capsule 1 po bid with food, Print         Ivery Quale, PA-C 04/16/16 1214    Donnetta Hutching, MD 04/19/16 1239

## 2016-10-31 DIAGNOSIS — E1165 Type 2 diabetes mellitus with hyperglycemia: Secondary | ICD-10-CM | POA: Diagnosis not present

## 2016-10-31 DIAGNOSIS — Z6841 Body Mass Index (BMI) 40.0 and over, adult: Secondary | ICD-10-CM | POA: Diagnosis not present

## 2016-10-31 DIAGNOSIS — E119 Type 2 diabetes mellitus without complications: Secondary | ICD-10-CM | POA: Diagnosis not present

## 2016-10-31 DIAGNOSIS — Z1389 Encounter for screening for other disorder: Secondary | ICD-10-CM | POA: Diagnosis not present

## 2016-10-31 DIAGNOSIS — K219 Gastro-esophageal reflux disease without esophagitis: Secondary | ICD-10-CM | POA: Diagnosis not present

## 2016-10-31 DIAGNOSIS — E782 Mixed hyperlipidemia: Secondary | ICD-10-CM | POA: Diagnosis not present

## 2016-10-31 DIAGNOSIS — Z0001 Encounter for general adult medical examination with abnormal findings: Secondary | ICD-10-CM | POA: Diagnosis not present

## 2016-10-31 DIAGNOSIS — I10 Essential (primary) hypertension: Secondary | ICD-10-CM | POA: Diagnosis not present

## 2016-11-02 ENCOUNTER — Encounter (INDEPENDENT_AMBULATORY_CARE_PROVIDER_SITE_OTHER): Payer: Self-pay | Admitting: *Deleted

## 2017-11-13 DIAGNOSIS — R201 Hypoesthesia of skin: Secondary | ICD-10-CM | POA: Diagnosis not present

## 2017-11-13 DIAGNOSIS — E782 Mixed hyperlipidemia: Secondary | ICD-10-CM | POA: Diagnosis not present

## 2017-11-13 DIAGNOSIS — D485 Neoplasm of uncertain behavior of skin: Secondary | ICD-10-CM | POA: Diagnosis not present

## 2017-11-13 DIAGNOSIS — Z6841 Body Mass Index (BMI) 40.0 and over, adult: Secondary | ICD-10-CM | POA: Diagnosis not present

## 2017-11-13 DIAGNOSIS — I158 Other secondary hypertension: Secondary | ICD-10-CM | POA: Diagnosis not present

## 2017-11-13 DIAGNOSIS — Z0001 Encounter for general adult medical examination with abnormal findings: Secondary | ICD-10-CM | POA: Diagnosis not present

## 2017-11-13 DIAGNOSIS — E1142 Type 2 diabetes mellitus with diabetic polyneuropathy: Secondary | ICD-10-CM | POA: Diagnosis not present

## 2017-11-13 DIAGNOSIS — E1165 Type 2 diabetes mellitus with hyperglycemia: Secondary | ICD-10-CM | POA: Diagnosis not present

## 2017-11-13 DIAGNOSIS — Z1389 Encounter for screening for other disorder: Secondary | ICD-10-CM | POA: Diagnosis not present

## 2018-11-14 DIAGNOSIS — Z6841 Body Mass Index (BMI) 40.0 and over, adult: Secondary | ICD-10-CM | POA: Diagnosis not present

## 2018-11-14 DIAGNOSIS — K219 Gastro-esophageal reflux disease without esophagitis: Secondary | ICD-10-CM | POA: Diagnosis not present

## 2018-11-14 DIAGNOSIS — E1165 Type 2 diabetes mellitus with hyperglycemia: Secondary | ICD-10-CM | POA: Diagnosis not present

## 2018-11-14 DIAGNOSIS — E1140 Type 2 diabetes mellitus with diabetic neuropathy, unspecified: Secondary | ICD-10-CM | POA: Diagnosis not present

## 2018-11-14 DIAGNOSIS — Z0001 Encounter for general adult medical examination with abnormal findings: Secondary | ICD-10-CM | POA: Diagnosis not present

## 2018-11-14 DIAGNOSIS — Z1389 Encounter for screening for other disorder: Secondary | ICD-10-CM | POA: Diagnosis not present

## 2019-01-01 ENCOUNTER — Other Ambulatory Visit: Payer: Self-pay

## 2019-01-01 DIAGNOSIS — Z20822 Contact with and (suspected) exposure to covid-19: Secondary | ICD-10-CM

## 2019-01-02 LAB — NOVEL CORONAVIRUS, NAA: SARS-CoV-2, NAA: NOT DETECTED

## 2019-07-03 DIAGNOSIS — I10 Essential (primary) hypertension: Secondary | ICD-10-CM | POA: Diagnosis not present

## 2019-07-03 DIAGNOSIS — L03311 Cellulitis of abdominal wall: Secondary | ICD-10-CM | POA: Diagnosis not present

## 2019-07-03 DIAGNOSIS — Z6841 Body Mass Index (BMI) 40.0 and over, adult: Secondary | ICD-10-CM | POA: Diagnosis not present

## 2019-07-03 DIAGNOSIS — E1165 Type 2 diabetes mellitus with hyperglycemia: Secondary | ICD-10-CM | POA: Diagnosis not present

## 2019-07-03 DIAGNOSIS — L0293 Carbuncle, unspecified: Secondary | ICD-10-CM | POA: Diagnosis not present

## 2019-07-03 DIAGNOSIS — E119 Type 2 diabetes mellitus without complications: Secondary | ICD-10-CM | POA: Diagnosis not present

## 2019-11-18 DIAGNOSIS — Z6841 Body Mass Index (BMI) 40.0 and over, adult: Secondary | ICD-10-CM | POA: Diagnosis not present

## 2019-11-18 DIAGNOSIS — K219 Gastro-esophageal reflux disease without esophagitis: Secondary | ICD-10-CM | POA: Diagnosis not present

## 2019-11-18 DIAGNOSIS — Z0001 Encounter for general adult medical examination with abnormal findings: Secondary | ICD-10-CM | POA: Diagnosis not present

## 2019-11-18 DIAGNOSIS — I10 Essential (primary) hypertension: Secondary | ICD-10-CM | POA: Diagnosis not present

## 2019-11-18 DIAGNOSIS — E1140 Type 2 diabetes mellitus with diabetic neuropathy, unspecified: Secondary | ICD-10-CM | POA: Diagnosis not present

## 2020-05-25 DIAGNOSIS — M25512 Pain in left shoulder: Secondary | ICD-10-CM | POA: Diagnosis not present

## 2020-05-25 DIAGNOSIS — E291 Testicular hypofunction: Secondary | ICD-10-CM | POA: Diagnosis not present

## 2020-05-25 DIAGNOSIS — I10 Essential (primary) hypertension: Secondary | ICD-10-CM | POA: Diagnosis not present

## 2020-05-25 DIAGNOSIS — N529 Male erectile dysfunction, unspecified: Secondary | ICD-10-CM | POA: Diagnosis not present

## 2020-05-25 DIAGNOSIS — M1991 Primary osteoarthritis, unspecified site: Secondary | ICD-10-CM | POA: Diagnosis not present

## 2020-05-25 DIAGNOSIS — K219 Gastro-esophageal reflux disease without esophagitis: Secondary | ICD-10-CM | POA: Diagnosis not present

## 2020-05-25 DIAGNOSIS — Z6841 Body Mass Index (BMI) 40.0 and over, adult: Secondary | ICD-10-CM | POA: Diagnosis not present

## 2020-05-25 DIAGNOSIS — R201 Hypoesthesia of skin: Secondary | ICD-10-CM | POA: Diagnosis not present

## 2020-05-25 DIAGNOSIS — Z0001 Encounter for general adult medical examination with abnormal findings: Secondary | ICD-10-CM | POA: Diagnosis not present

## 2020-05-25 DIAGNOSIS — Z1331 Encounter for screening for depression: Secondary | ICD-10-CM | POA: Diagnosis not present

## 2020-05-25 DIAGNOSIS — E1165 Type 2 diabetes mellitus with hyperglycemia: Secondary | ICD-10-CM | POA: Diagnosis not present

## 2020-05-25 DIAGNOSIS — E7849 Other hyperlipidemia: Secondary | ICD-10-CM | POA: Diagnosis not present

## 2020-07-08 DIAGNOSIS — E785 Hyperlipidemia, unspecified: Secondary | ICD-10-CM | POA: Diagnosis not present

## 2020-07-08 DIAGNOSIS — E1129 Type 2 diabetes mellitus with other diabetic kidney complication: Secondary | ICD-10-CM | POA: Diagnosis not present

## 2020-07-08 DIAGNOSIS — E1165 Type 2 diabetes mellitus with hyperglycemia: Secondary | ICD-10-CM | POA: Diagnosis not present

## 2020-07-08 DIAGNOSIS — Z794 Long term (current) use of insulin: Secondary | ICD-10-CM | POA: Diagnosis not present

## 2020-07-22 DIAGNOSIS — E1129 Type 2 diabetes mellitus with other diabetic kidney complication: Secondary | ICD-10-CM | POA: Diagnosis not present

## 2020-07-22 DIAGNOSIS — E785 Hyperlipidemia, unspecified: Secondary | ICD-10-CM | POA: Diagnosis not present

## 2020-07-22 DIAGNOSIS — Z794 Long term (current) use of insulin: Secondary | ICD-10-CM | POA: Diagnosis not present

## 2020-07-22 DIAGNOSIS — E1165 Type 2 diabetes mellitus with hyperglycemia: Secondary | ICD-10-CM | POA: Diagnosis not present

## 2020-08-17 DIAGNOSIS — E7849 Other hyperlipidemia: Secondary | ICD-10-CM | POA: Diagnosis not present

## 2020-08-17 DIAGNOSIS — Z6841 Body Mass Index (BMI) 40.0 and over, adult: Secondary | ICD-10-CM | POA: Diagnosis not present

## 2020-08-17 DIAGNOSIS — E1140 Type 2 diabetes mellitus with diabetic neuropathy, unspecified: Secondary | ICD-10-CM | POA: Diagnosis not present

## 2020-08-17 DIAGNOSIS — R253 Fasciculation: Secondary | ICD-10-CM | POA: Diagnosis not present

## 2020-09-01 DIAGNOSIS — M79604 Pain in right leg: Secondary | ICD-10-CM | POA: Diagnosis not present

## 2020-09-14 ENCOUNTER — Encounter: Payer: Self-pay | Admitting: Vascular Surgery

## 2020-09-14 ENCOUNTER — Ambulatory Visit (INDEPENDENT_AMBULATORY_CARE_PROVIDER_SITE_OTHER): Payer: BC Managed Care – PPO | Admitting: Vascular Surgery

## 2020-09-14 ENCOUNTER — Other Ambulatory Visit: Payer: Self-pay

## 2020-09-14 DIAGNOSIS — M79651 Pain in right thigh: Secondary | ICD-10-CM | POA: Diagnosis not present

## 2020-09-14 DIAGNOSIS — M79659 Pain in unspecified thigh: Secondary | ICD-10-CM | POA: Insufficient documentation

## 2020-09-14 DIAGNOSIS — M79652 Pain in left thigh: Secondary | ICD-10-CM

## 2020-09-14 NOTE — Progress Notes (Signed)
Patient name: Aaron Matthews MRN: [id number] DOB: [birth year] Sex: male  REASON FOR CONSULT: Evaluate possible PVD and thigh claudication  HPI: Aaron Matthews is a 60 y.o. male, with history of hypertension and diabetes that presents for evaluation of possible PVD and lower extremity claudication.  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  Over the last several months he has had significant pain worse in the thighs and sometimes in the calves.  He does feel this is worse when he walks but at times it also bothers him when he sitting in his recliner at home.  Getting on and off the table reproduced the pain today.  This has become very debilitating and he has been unable to work to his normal capacity.  He denies any current tobacco abuse.  No classic rest pain symptoms or open wounds.  Past Medical History:  Diagnosis Date  . Bursitis    right shoulder  . Diabetes mellitus   . Diastolic dysfunction 01/18/2013   Grade 1  . GERD (gastroesophageal reflux disease)   . Heart murmur    as achild  . Hypertension     Past Surgical History:  Procedure Laterality Date  . CHOLECYSTECTOMY    . dental extraction     complete  . TONSILLECTOMY    . TOTAL HIP ARTHROPLASTY  01/30/2012   Procedure: TOTAL HIP ARTHROPLASTY ANTERIOR APPROACH;  Surgeon: Shelda Pal, MD;  Location: [facility];  Service: Orthopedics;  Laterality: Left;    History reviewed. No pertinent family history.  SOCIAL HISTORY: Social History   Socioeconomic History  . Marital status: Married    Spouse name: Not on file  . Number of children: Not on file  . Years of education: Not on file  . Highest education level: Not on file  Occupational History  . Not on file  Tobacco Use  . Smoking status: Former Smoker    Packs/day: 1.00    Years: 10.00    Pack years: 10.00    Types: Cigarettes    Quit date: 01/24/1993    Years since quitting: 27.6  . Smokeless  tobacco: Never Used  Vaping Use  . Vaping Use: Never used  Substance and Sexual Activity  . Alcohol use: No  . Drug use: No  . Sexual activity: Not on file  Other Topics Concern  . Not on file  Social History Narrative  . Not on file   Social Determinants of Health   Financial Resource Strain: Not on file  Food Insecurity: Not on file  Transportation Needs: Not on file  Physical Activity: Not on file  Stress: Not on file  Social Connections: Not on file  Intimate Partner Violence: Not on file    Allergies  Allergen Reactions  . Penicillins Swelling and Rash    Current Outpatient Medications  Medication Sig Dispense Refill  . aspirin EC 81 MG tablet Take 81 mg by mouth daily. Swallow whole.    Marland Kitchen atorvastatin (LIPITOR) 20 MG tablet Take 1 tablet by mouth daily.    . Coenzyme Q10 (CO Q-10) 100 MG CAPS See admin instructions.    . Continuous Blood Gluc Sensor (FREESTYLE LIBRE 2 SENSOR) MISC Apply topically.    . enalapril (VASOTEC) 10 MG tablet Take 1 tablet daily in the morning and take the second tablet in the afternoon/evening if your systolic blood pressure (top number) is 120 or above.    Marland Kitchen  fexofenadine (ALLEGRA) 180 MG tablet Take 180 mg by mouth daily.    Marland Kitchen gemfibrozil (LOPID) 600 MG tablet Take 600 mg by mouth 2 (two) times daily.    Marland Kitchen HUMALOG KWIKPEN 100 UNIT/ML KiwkPen Inject 15-20 Units into the muscle 2 (two) times daily.    . insulin detemir (LEVEMIR) 100 UNIT/ML injection Inject 0.5 mLs (50 Units total) into the skin at bedtime.    . insulin lispro (HUMALOG) 100 UNIT/ML KwikPen Junior 25 to 30  units    . JARDIANCE 25 MG TABS tablet Take 25 mg by mouth daily.    Marland Kitchen LEVEMIR FLEXTOUCH 100 UNIT/ML Pen Inject 30 Units as directed daily.    . meloxicam (MOBIC) 7.5 MG tablet Take 7.5 mg by mouth 2 (two) times daily as needed.    . metFORMIN (GLUCOPHAGE-XR) 750 MG 24 hr tablet Take 750 mg by mouth daily with breakfast.    . omeprazole (PRILOSEC) 20 MG capsule Take 20 mg  by mouth daily.    Marland Kitchen OZEMPIC, 0.25 OR 0.5 MG/DOSE, 2 MG/1.5ML SOPN Inject 0.5 mg into the skin once a week.    . tadalafil (CIALIS) 20 MG tablet Take 20 mg by mouth as needed.     No current facility-administered medications for this visit.    REVIEW OF SYSTEMS:  [X]  denotes positive finding, [ ]  denotes negative finding Cardiac  Comments:  Chest pain or chest pressure:    Shortness of breath upon exertion:    Short of breath when lying flat:    Irregular heart rhythm:        Vascular    Pain in calf, thigh, or hip brought on by ambulation: x   Pain in feet at night that wakes you up from your sleep:     Blood clot in your veins:    Leg swelling:         Pulmonary    Oxygen at home:    Productive cough:     Wheezing:         Neurologic    Sudden weakness in arms or legs:     Sudden numbness in arms or legs:     Sudden onset of difficulty speaking or slurred speech:    Temporary loss of vision in one eye:     Problems with dizziness:         Gastrointestinal    Blood in stool:     Vomited blood:         Genitourinary    Burning when urinating:     Blood in urine:        Psychiatric    Major depression:         Hematologic    Bleeding problems:    Problems with blood clotting too easily:        Skin    Rashes or ulcers:        Constitutional    Fever or chills:      PHYSICAL EXAM: Vitals:   09/14/20 1012  BP: 136/85  Pulse: 90  Resp: 20  Temp: 98.2 F (36.8 C)  SpO2: 95%  Weight: (!) 306 lb (138.8 kg)  Height: 6\' 1"  (1.854 m)    GENERAL: The patient is a well-nourished male, in no acute distress. The vital signs are documented above. CARDIAC: There is a regular rate and rhythm.  VASCULAR:  Palpable femoral pulses bilaterally Palpable popliteal pulses bilaterally Palpable dorsalis pedis and posterior tibial pulses bilaterally No lower extremity tissue loss  PULMONARY: No respiratory distress. ABDOMEN: Soft and non-tender. MUSCULOSKELETAL: There  are no major deformities or cyanosis. NEUROLOGIC: No focal weakness or paresthesias are detected. SKIN: There are no ulcers or rashes noted. PSYCHIATRIC: The patient has a normal affect.  DATA:       Assessment/Plan:  60 year old male presents for evaluation of possible PVD and lower extremity vascular claudication.  I discussed in detail that his ABIs were normal with normal arterial waveforms at the ankle per the report.  On exam he has easily palpable dorsalis pedis and posterior tibial pulses that are bounding and no signs of vascular insuffiencey on exam.  I do not feel that he has vascular claudication given his exam and non-invasive findings.  He also has referral to a neurologist which I think would be an appropriate work-up to rule out any neurogenic claudication or back problem etc.  I will be happy to see him in follow-up as needed.   Cephus Shelling, MD [facility] Office: [phone]

## 2020-09-28 DIAGNOSIS — Z6841 Body Mass Index (BMI) 40.0 and over, adult: Secondary | ICD-10-CM | POA: Diagnosis not present

## 2020-09-28 DIAGNOSIS — G9519 Other vascular myelopathies: Secondary | ICD-10-CM | POA: Diagnosis not present

## 2020-09-28 DIAGNOSIS — E1140 Type 2 diabetes mellitus with diabetic neuropathy, unspecified: Secondary | ICD-10-CM | POA: Diagnosis not present

## 2020-09-30 ENCOUNTER — Other Ambulatory Visit (HOSPITAL_COMMUNITY): Payer: Self-pay | Admitting: Neurology

## 2020-09-30 DIAGNOSIS — M48062 Spinal stenosis, lumbar region with neurogenic claudication: Secondary | ICD-10-CM | POA: Diagnosis not present

## 2020-09-30 DIAGNOSIS — G4733 Obstructive sleep apnea (adult) (pediatric): Secondary | ICD-10-CM | POA: Diagnosis not present

## 2020-09-30 DIAGNOSIS — E1142 Type 2 diabetes mellitus with diabetic polyneuropathy: Secondary | ICD-10-CM | POA: Diagnosis not present

## 2020-10-21 ENCOUNTER — Ambulatory Visit (HOSPITAL_COMMUNITY)
Admission: RE | Admit: 2020-10-21 | Discharge: 2020-10-21 | Disposition: A | Discharge status: Home / Self Care | Payer: BC Managed Care – PPO | Source: Ambulatory Visit | Attending: Neurology | Admitting: Neurology

## 2020-10-21 ENCOUNTER — Other Ambulatory Visit: Payer: Self-pay

## 2020-10-21 DIAGNOSIS — I878 Other specified disorders of veins: Secondary | ICD-10-CM | POA: Diagnosis not present

## 2020-10-21 DIAGNOSIS — M47816 Spondylosis without myelopathy or radiculopathy, lumbar region: Secondary | ICD-10-CM | POA: Diagnosis not present

## 2020-10-21 DIAGNOSIS — M48062 Spinal stenosis, lumbar region with neurogenic claudication: Secondary | ICD-10-CM | POA: Diagnosis not present

## 2020-10-21 DIAGNOSIS — M48061 Spinal stenosis, lumbar region without neurogenic claudication: Secondary | ICD-10-CM | POA: Diagnosis not present

## 2020-10-21 DIAGNOSIS — M5134 Other intervertebral disc degeneration, thoracic region: Secondary | ICD-10-CM | POA: Diagnosis not present

## 2020-10-28 DIAGNOSIS — G629 Polyneuropathy, unspecified: Secondary | ICD-10-CM | POA: Diagnosis not present

## 2020-10-28 DIAGNOSIS — G4733 Obstructive sleep apnea (adult) (pediatric): Secondary | ICD-10-CM | POA: Diagnosis not present

## 2020-10-28 DIAGNOSIS — M48062 Spinal stenosis, lumbar region with neurogenic claudication: Secondary | ICD-10-CM | POA: Diagnosis not present

## 2020-10-28 DIAGNOSIS — E1142 Type 2 diabetes mellitus with diabetic polyneuropathy: Secondary | ICD-10-CM | POA: Diagnosis not present

## 2020-12-16 DIAGNOSIS — E1142 Type 2 diabetes mellitus with diabetic polyneuropathy: Secondary | ICD-10-CM | POA: Diagnosis not present

## 2020-12-17 ENCOUNTER — Other Ambulatory Visit (HOSPITAL_BASED_OUTPATIENT_CLINIC_OR_DEPARTMENT_OTHER): Payer: Self-pay

## 2020-12-17 DIAGNOSIS — G4733 Obstructive sleep apnea (adult) (pediatric): Secondary | ICD-10-CM

## 2020-12-23 DIAGNOSIS — G629 Polyneuropathy, unspecified: Secondary | ICD-10-CM | POA: Diagnosis not present

## 2020-12-23 DIAGNOSIS — M48062 Spinal stenosis, lumbar region with neurogenic claudication: Secondary | ICD-10-CM | POA: Diagnosis not present

## 2020-12-23 DIAGNOSIS — E1142 Type 2 diabetes mellitus with diabetic polyneuropathy: Secondary | ICD-10-CM | POA: Diagnosis not present

## 2020-12-23 DIAGNOSIS — G4733 Obstructive sleep apnea (adult) (pediatric): Secondary | ICD-10-CM | POA: Diagnosis not present

## 2021-01-31 DIAGNOSIS — E1140 Type 2 diabetes mellitus with diabetic neuropathy, unspecified: Secondary | ICD-10-CM | POA: Diagnosis not present

## 2021-01-31 DIAGNOSIS — I10 Essential (primary) hypertension: Secondary | ICD-10-CM | POA: Diagnosis not present

## 2021-01-31 DIAGNOSIS — Z6841 Body Mass Index (BMI) 40.0 and over, adult: Secondary | ICD-10-CM | POA: Diagnosis not present

## 2021-03-22 DIAGNOSIS — E1140 Type 2 diabetes mellitus with diabetic neuropathy, unspecified: Secondary | ICD-10-CM | POA: Diagnosis not present

## 2021-03-22 DIAGNOSIS — M1991 Primary osteoarthritis, unspecified site: Secondary | ICD-10-CM | POA: Diagnosis not present

## 2021-05-05 DIAGNOSIS — E1140 Type 2 diabetes mellitus with diabetic neuropathy, unspecified: Secondary | ICD-10-CM | POA: Diagnosis not present

## 2021-05-05 DIAGNOSIS — Z6841 Body Mass Index (BMI) 40.0 and over, adult: Secondary | ICD-10-CM | POA: Diagnosis not present

## 2021-05-05 DIAGNOSIS — L309 Dermatitis, unspecified: Secondary | ICD-10-CM | POA: Diagnosis not present

## 2021-05-05 DIAGNOSIS — I10 Essential (primary) hypertension: Secondary | ICD-10-CM | POA: Diagnosis not present

## 2021-11-03 DIAGNOSIS — Z6841 Body Mass Index (BMI) 40.0 and over, adult: Secondary | ICD-10-CM | POA: Diagnosis not present

## 2021-11-03 DIAGNOSIS — E1140 Type 2 diabetes mellitus with diabetic neuropathy, unspecified: Secondary | ICD-10-CM | POA: Diagnosis not present

## 2021-11-03 DIAGNOSIS — Z0001 Encounter for general adult medical examination with abnormal findings: Secondary | ICD-10-CM | POA: Diagnosis not present

## 2021-11-03 DIAGNOSIS — I10 Essential (primary) hypertension: Secondary | ICD-10-CM | POA: Diagnosis not present

## 2022-10-19 DIAGNOSIS — A46 Erysipelas: Secondary | ICD-10-CM | POA: Diagnosis not present

## 2022-10-19 DIAGNOSIS — E1140 Type 2 diabetes mellitus with diabetic neuropathy, unspecified: Secondary | ICD-10-CM | POA: Diagnosis not present

## 2022-10-19 DIAGNOSIS — I10 Essential (primary) hypertension: Secondary | ICD-10-CM | POA: Diagnosis not present

## 2022-10-19 DIAGNOSIS — E291 Testicular hypofunction: Secondary | ICD-10-CM | POA: Diagnosis not present

## 2022-10-19 DIAGNOSIS — E1165 Type 2 diabetes mellitus with hyperglycemia: Secondary | ICD-10-CM | POA: Diagnosis not present

## 2022-10-19 DIAGNOSIS — Z6841 Body Mass Index (BMI) 40.0 and over, adult: Secondary | ICD-10-CM | POA: Diagnosis not present

## 2022-10-19 DIAGNOSIS — M1991 Primary osteoarthritis, unspecified site: Secondary | ICD-10-CM | POA: Diagnosis not present

## 2022-11-16 DIAGNOSIS — A46 Erysipelas: Secondary | ICD-10-CM | POA: Diagnosis not present

## 2022-11-16 DIAGNOSIS — I10 Essential (primary) hypertension: Secondary | ICD-10-CM | POA: Diagnosis not present

## 2022-11-16 DIAGNOSIS — M1991 Primary osteoarthritis, unspecified site: Secondary | ICD-10-CM | POA: Diagnosis not present

## 2022-11-16 DIAGNOSIS — Z6841 Body Mass Index (BMI) 40.0 and over, adult: Secondary | ICD-10-CM | POA: Diagnosis not present

## 2022-11-16 DIAGNOSIS — E1140 Type 2 diabetes mellitus with diabetic neuropathy, unspecified: Secondary | ICD-10-CM | POA: Diagnosis not present

## 2022-11-16 DIAGNOSIS — E291 Testicular hypofunction: Secondary | ICD-10-CM | POA: Diagnosis not present

## 2022-11-16 DIAGNOSIS — E1165 Type 2 diabetes mellitus with hyperglycemia: Secondary | ICD-10-CM | POA: Diagnosis not present

## 2022-11-16 DIAGNOSIS — B369 Superficial mycosis, unspecified: Secondary | ICD-10-CM | POA: Diagnosis not present

## 2022-11-16 DIAGNOSIS — Z1331 Encounter for screening for depression: Secondary | ICD-10-CM | POA: Diagnosis not present

## 2022-11-16 DIAGNOSIS — E538 Deficiency of other specified B group vitamins: Secondary | ICD-10-CM | POA: Diagnosis not present

## 2022-11-16 DIAGNOSIS — Z0001 Encounter for general adult medical examination with abnormal findings: Secondary | ICD-10-CM | POA: Diagnosis not present

## 2022-11-16 DIAGNOSIS — N529 Male erectile dysfunction, unspecified: Secondary | ICD-10-CM | POA: Diagnosis not present

## 2022-11-16 DIAGNOSIS — E559 Vitamin D deficiency, unspecified: Secondary | ICD-10-CM | POA: Diagnosis not present

## 2023-09-24 DIAGNOSIS — R42 Dizziness and giddiness: Secondary | ICD-10-CM | POA: Diagnosis not present

## 2023-09-24 DIAGNOSIS — E1129 Type 2 diabetes mellitus with other diabetic kidney complication: Secondary | ICD-10-CM | POA: Diagnosis not present

## 2023-09-24 DIAGNOSIS — Z6841 Body Mass Index (BMI) 40.0 and over, adult: Secondary | ICD-10-CM | POA: Diagnosis not present

## 2023-09-24 DIAGNOSIS — R519 Headache, unspecified: Secondary | ICD-10-CM | POA: Diagnosis not present

## 2023-09-24 DIAGNOSIS — E1165 Type 2 diabetes mellitus with hyperglycemia: Secondary | ICD-10-CM | POA: Diagnosis not present

## 2023-09-24 DIAGNOSIS — E291 Testicular hypofunction: Secondary | ICD-10-CM | POA: Diagnosis not present

## 2023-09-24 DIAGNOSIS — M1991 Primary osteoarthritis, unspecified site: Secondary | ICD-10-CM | POA: Diagnosis not present

## 2023-09-24 DIAGNOSIS — E1140 Type 2 diabetes mellitus with diabetic neuropathy, unspecified: Secondary | ICD-10-CM | POA: Diagnosis not present

## 2023-09-24 DIAGNOSIS — I10 Essential (primary) hypertension: Secondary | ICD-10-CM | POA: Diagnosis not present

## 2023-09-25 ENCOUNTER — Other Ambulatory Visit (HOSPITAL_COMMUNITY): Payer: Self-pay | Admitting: Internal Medicine

## 2023-09-25 ENCOUNTER — Ambulatory Visit (HOSPITAL_COMMUNITY)
Admission: RE | Admit: 2023-09-25 | Discharge: 2023-09-25 | Disposition: A | Discharge status: Home / Self Care | Source: Ambulatory Visit | Attending: Internal Medicine | Admitting: Internal Medicine

## 2023-09-25 DIAGNOSIS — R42 Dizziness and giddiness: Secondary | ICD-10-CM | POA: Diagnosis not present

## 2023-09-25 DIAGNOSIS — R519 Headache, unspecified: Secondary | ICD-10-CM

## 2023-09-25 DIAGNOSIS — I6782 Cerebral ischemia: Secondary | ICD-10-CM | POA: Diagnosis not present

## 2023-10-01 DIAGNOSIS — I10 Essential (primary) hypertension: Secondary | ICD-10-CM | POA: Diagnosis not present

## 2023-10-01 DIAGNOSIS — R42 Dizziness and giddiness: Secondary | ICD-10-CM | POA: Diagnosis not present

## 2023-10-01 DIAGNOSIS — E1165 Type 2 diabetes mellitus with hyperglycemia: Secondary | ICD-10-CM | POA: Diagnosis not present

## 2023-10-01 DIAGNOSIS — Z6841 Body Mass Index (BMI) 40.0 and over, adult: Secondary | ICD-10-CM | POA: Diagnosis not present

## 2023-10-01 DIAGNOSIS — R55 Syncope and collapse: Secondary | ICD-10-CM | POA: Diagnosis not present

## 2023-10-01 DIAGNOSIS — E1129 Type 2 diabetes mellitus with other diabetic kidney complication: Secondary | ICD-10-CM | POA: Diagnosis not present

## 2023-10-01 DIAGNOSIS — M1991 Primary osteoarthritis, unspecified site: Secondary | ICD-10-CM | POA: Diagnosis not present

## 2023-10-01 DIAGNOSIS — E1140 Type 2 diabetes mellitus with diabetic neuropathy, unspecified: Secondary | ICD-10-CM | POA: Diagnosis not present

## 2023-10-18 DIAGNOSIS — E291 Testicular hypofunction: Secondary | ICD-10-CM | POA: Diagnosis not present

## 2023-10-18 DIAGNOSIS — K5900 Constipation, unspecified: Secondary | ICD-10-CM | POA: Diagnosis not present

## 2023-10-18 DIAGNOSIS — R42 Dizziness and giddiness: Secondary | ICD-10-CM | POA: Diagnosis not present

## 2023-10-18 DIAGNOSIS — E1140 Type 2 diabetes mellitus with diabetic neuropathy, unspecified: Secondary | ICD-10-CM | POA: Diagnosis not present

## 2023-10-18 DIAGNOSIS — M1991 Primary osteoarthritis, unspecified site: Secondary | ICD-10-CM | POA: Diagnosis not present

## 2023-10-18 DIAGNOSIS — Z6841 Body Mass Index (BMI) 40.0 and over, adult: Secondary | ICD-10-CM | POA: Diagnosis not present

## 2023-10-18 DIAGNOSIS — E1165 Type 2 diabetes mellitus with hyperglycemia: Secondary | ICD-10-CM | POA: Diagnosis not present

## 2023-10-18 DIAGNOSIS — I10 Essential (primary) hypertension: Secondary | ICD-10-CM | POA: Diagnosis not present

## 2023-11-22 DIAGNOSIS — Z6841 Body Mass Index (BMI) 40.0 and over, adult: Secondary | ICD-10-CM | POA: Diagnosis not present

## 2023-11-22 DIAGNOSIS — E6609 Other obesity due to excess calories: Secondary | ICD-10-CM | POA: Diagnosis not present

## 2023-11-22 DIAGNOSIS — R55 Syncope and collapse: Secondary | ICD-10-CM | POA: Diagnosis not present

## 2023-11-22 DIAGNOSIS — E1165 Type 2 diabetes mellitus with hyperglycemia: Secondary | ICD-10-CM | POA: Diagnosis not present

## 2023-11-22 DIAGNOSIS — R232 Flushing: Secondary | ICD-10-CM | POA: Diagnosis not present

## 2023-11-22 DIAGNOSIS — I10 Essential (primary) hypertension: Secondary | ICD-10-CM | POA: Diagnosis not present

## 2023-11-22 DIAGNOSIS — Z0001 Encounter for general adult medical examination with abnormal findings: Secondary | ICD-10-CM | POA: Diagnosis not present

## 2023-11-22 DIAGNOSIS — R61 Generalized hyperhidrosis: Secondary | ICD-10-CM | POA: Diagnosis not present

## 2023-11-22 DIAGNOSIS — Z1331 Encounter for screening for depression: Secondary | ICD-10-CM | POA: Diagnosis not present

## 2023-11-22 DIAGNOSIS — E1129 Type 2 diabetes mellitus with other diabetic kidney complication: Secondary | ICD-10-CM | POA: Diagnosis not present

## 2023-11-22 DIAGNOSIS — R42 Dizziness and giddiness: Secondary | ICD-10-CM | POA: Diagnosis not present

## 2023-11-22 DIAGNOSIS — E1140 Type 2 diabetes mellitus with diabetic neuropathy, unspecified: Secondary | ICD-10-CM | POA: Diagnosis not present

## 2023-11-27 ENCOUNTER — Other Ambulatory Visit: Payer: Self-pay

## 2023-11-27 ENCOUNTER — Encounter (HOSPITAL_COMMUNITY): Payer: Self-pay | Admitting: Emergency Medicine

## 2023-11-27 ENCOUNTER — Observation Stay (HOSPITAL_COMMUNITY)
Admission: EM | Admit: 2023-11-27 | Discharge: 2023-11-30 | Disposition: A | Discharge status: Home / Self Care | Source: Ambulatory Visit | Attending: Family Medicine | Admitting: Family Medicine

## 2023-11-27 DIAGNOSIS — C189 Malignant neoplasm of colon, unspecified: Secondary | ICD-10-CM

## 2023-11-27 DIAGNOSIS — D123 Benign neoplasm of transverse colon: Principal | ICD-10-CM | POA: Insufficient documentation

## 2023-11-27 DIAGNOSIS — D649 Anemia, unspecified: Principal | ICD-10-CM

## 2023-11-27 DIAGNOSIS — E1165 Type 2 diabetes mellitus with hyperglycemia: Secondary | ICD-10-CM | POA: Insufficient documentation

## 2023-11-27 DIAGNOSIS — K317 Polyp of stomach and duodenum: Secondary | ICD-10-CM | POA: Insufficient documentation

## 2023-11-27 DIAGNOSIS — C180 Malignant neoplasm of cecum: Secondary | ICD-10-CM | POA: Insufficient documentation

## 2023-11-27 DIAGNOSIS — K922 Gastrointestinal hemorrhage, unspecified: Secondary | ICD-10-CM | POA: Insufficient documentation

## 2023-11-27 DIAGNOSIS — Z87891 Personal history of nicotine dependence: Secondary | ICD-10-CM | POA: Insufficient documentation

## 2023-11-27 DIAGNOSIS — D509 Iron deficiency anemia, unspecified: Secondary | ICD-10-CM | POA: Insufficient documentation

## 2023-11-27 DIAGNOSIS — R000 Tachycardia, unspecified: Secondary | ICD-10-CM | POA: Diagnosis not present

## 2023-11-27 DIAGNOSIS — Z800 Family history of malignant neoplasm of digestive organs: Secondary | ICD-10-CM

## 2023-11-27 DIAGNOSIS — Z6841 Body Mass Index (BMI) 40.0 and over, adult: Secondary | ICD-10-CM | POA: Diagnosis not present

## 2023-11-27 DIAGNOSIS — R739 Hyperglycemia, unspecified: Secondary | ICD-10-CM | POA: Diagnosis present

## 2023-11-27 DIAGNOSIS — E1140 Type 2 diabetes mellitus with diabetic neuropathy, unspecified: Secondary | ICD-10-CM | POA: Diagnosis not present

## 2023-11-27 DIAGNOSIS — K219 Gastro-esophageal reflux disease without esophagitis: Secondary | ICD-10-CM | POA: Insufficient documentation

## 2023-11-27 DIAGNOSIS — N1831 Chronic kidney disease, stage 3a: Secondary | ICD-10-CM

## 2023-11-27 DIAGNOSIS — I110 Hypertensive heart disease with heart failure: Secondary | ICD-10-CM | POA: Insufficient documentation

## 2023-11-27 DIAGNOSIS — I10 Essential (primary) hypertension: Secondary | ICD-10-CM | POA: Diagnosis present

## 2023-11-27 DIAGNOSIS — C787 Secondary malignant neoplasm of liver and intrahepatic bile duct: Secondary | ICD-10-CM

## 2023-11-27 DIAGNOSIS — I5189 Other ill-defined heart diseases: Secondary | ICD-10-CM

## 2023-11-27 DIAGNOSIS — D62 Acute posthemorrhagic anemia: Secondary | ICD-10-CM | POA: Diagnosis not present

## 2023-11-27 DIAGNOSIS — Z7982 Long term (current) use of aspirin: Secondary | ICD-10-CM | POA: Insufficient documentation

## 2023-11-27 DIAGNOSIS — I5030 Unspecified diastolic (congestive) heart failure: Secondary | ICD-10-CM | POA: Diagnosis not present

## 2023-11-27 LAB — MRSA NEXT GEN BY PCR, NASAL: MRSA by PCR Next Gen: NOT DETECTED

## 2023-11-27 LAB — HEMOGLOBIN AND HEMATOCRIT, BLOOD
HCT: 26.6 % — ABNORMAL LOW (ref 39.0–52.0)
Hemoglobin: 7.2 g/dL — ABNORMAL LOW (ref 13.0–17.0)

## 2023-11-27 LAB — COMPREHENSIVE METABOLIC PANEL WITH GFR
ALT: 15 U/L (ref 0–44)
AST: 19 U/L (ref 15–41)
Albumin: 4.1 g/dL (ref 3.5–5.0)
Alkaline Phosphatase: 58 U/L (ref 38–126)
Anion gap: 13 (ref 5–15)
BUN: 20 mg/dL (ref 8–23)
CO2: 21 mmol/L — ABNORMAL LOW (ref 22–32)
Calcium: 9.2 mg/dL (ref 8.9–10.3)
Chloride: 100 mmol/L (ref 98–111)
Creatinine, Ser: 0.81 mg/dL (ref 0.61–1.24)
GFR, Estimated: >60 mL/min (ref >60)
Glucose, Bld: 282 mg/dL — ABNORMAL HIGH (ref 70–99)
Potassium: 4.2 mmol/L (ref 3.5–5.1)
Sodium: 134 mmol/L — ABNORMAL LOW (ref 135–145)
Total Bilirubin: 0.8 mg/dL (ref 0.0–1.2)
Total Protein: 7.3 g/dL (ref 6.5–8.1)

## 2023-11-27 LAB — CBC WITH DIFFERENTIAL/PLATELET
Abs Immature Granulocytes: 0.05 K/uL (ref 0.00–0.07)
Basophils Absolute: 0.1 K/uL (ref 0.0–0.1)
Basophils Relative: 1 %
Eosinophils Absolute: 0.3 K/uL (ref 0.0–0.5)
Eosinophils Relative: 4 %
HCT: 23.6 % — ABNORMAL LOW (ref 39.0–52.0)
Hemoglobin: 5.7 g/dL — CL (ref 13.0–17.0)
Immature Granulocytes: 1 %
Lymphocytes Relative: 16 %
Lymphs Abs: 1 K/uL (ref 0.7–4.0)
MCH: 14.4 pg — ABNORMAL LOW (ref 26.0–34.0)
MCHC: 24.2 g/dL — ABNORMAL LOW (ref 30.0–36.0)
MCV: 59.7 fL — ABNORMAL LOW (ref 80.0–100.0)
Monocytes Absolute: 0.5 K/uL (ref 0.1–1.0)
Monocytes Relative: 9 %
Neutro Abs: 4.3 K/uL (ref 1.7–7.7)
Neutrophils Relative %: 69 %
Platelets: 290 K/uL (ref 150–400)
RBC: 3.95 MIL/uL — ABNORMAL LOW (ref 4.22–5.81)
RDW: 21.3 % — ABNORMAL HIGH (ref 11.5–15.5)
Smear Review: NORMAL
WBC: 6.2 K/uL (ref 4.0–10.5)
nRBC: 0.3 % — ABNORMAL HIGH (ref 0.0–0.2)

## 2023-11-27 LAB — RETICULOCYTES
Immature Retic Fract: 23.7 % — ABNORMAL HIGH (ref 2.3–15.9)
RBC.: 3.94 MIL/uL — ABNORMAL LOW (ref 4.22–5.81)
Retic Count, Absolute: 139.9 K/uL (ref 19.0–186.0)
Retic Ct Pct: 3.6 % — ABNORMAL HIGH (ref 0.4–3.1)

## 2023-11-27 LAB — IRON AND TIBC
Iron: 11 ug/dL — ABNORMAL LOW (ref 45–182)
Saturation Ratios: 2 % — ABNORMAL LOW (ref 17.9–39.5)
TIBC: 610 ug/dL — ABNORMAL HIGH (ref 250–450)
UIBC: 599 ug/dL

## 2023-11-27 LAB — PREPARE RBC (CROSSMATCH)

## 2023-11-27 LAB — GLUCOSE, CAPILLARY
Glucose-Capillary: 182 mg/dL — ABNORMAL HIGH (ref 70–99)
Glucose-Capillary: 147 mg/dL — ABNORMAL HIGH (ref 70–99)
Glucose-Capillary: 171 mg/dL — ABNORMAL HIGH (ref 70–99)

## 2023-11-27 LAB — FERRITIN: Ferritin: <3 ng/mL — ABNORMAL LOW (ref 24–336)

## 2023-11-27 LAB — VITAMIN B12: Vitamin B-12: 170 pg/mL — ABNORMAL LOW (ref 180–914)

## 2023-11-27 LAB — PROTIME-INR
INR: 0.9 (ref 0.8–1.2)
Prothrombin Time: 13 s (ref 11.4–15.2)

## 2023-11-27 LAB — APTT: aPTT: 27 s (ref 24–36)

## 2023-11-27 LAB — POC OCCULT BLOOD, ED

## 2023-11-27 LAB — FOLATE: Folate: 10.9 ng/mL (ref >5.9)

## 2023-11-27 MED ORDER — GABAPENTIN 300 MG PO CAPS
300.0000 mg | ORAL_CAPSULE | Freq: Four times a day (QID) | ORAL | Status: DC
  Administered 2023-11-27 – 2023-11-30 (×12): 300 mg via ORAL
  Filled 2023-11-27 (×2): qty 1
  Filled 2023-11-27: qty 3
  Filled 2023-11-27 (×9): qty 1

## 2023-11-27 MED ORDER — CHLORHEXIDINE GLUCONATE CLOTH 2 % EX PADS
6.0000 | MEDICATED_PAD | Freq: Every day | CUTANEOUS | Status: DC
  Administered 2023-11-27 – 2023-11-30 (×4): 6 via TOPICAL

## 2023-11-27 MED ORDER — INSULIN ASPART 100 UNIT/ML IJ SOLN
0.0000 [IU] | INTRAMUSCULAR | Status: DC
  Administered 2023-11-27 (×2): 3 [IU] via SUBCUTANEOUS
  Administered 2023-11-27: 2 [IU] via SUBCUTANEOUS
  Administered 2023-11-28 (×4): 3 [IU] via SUBCUTANEOUS
  Administered 2023-11-28: 2 [IU] via SUBCUTANEOUS
  Administered 2023-11-29: 5 [IU] via SUBCUTANEOUS
  Administered 2023-11-29 (×3): 3 [IU] via SUBCUTANEOUS
  Administered 2023-11-29: 2 [IU] via SUBCUTANEOUS
  Administered 2023-11-30 (×2): 3 [IU] via SUBCUTANEOUS
  Administered 2023-11-30: 5 [IU] via SUBCUTANEOUS
  Administered 2023-11-30: 3 [IU] via SUBCUTANEOUS

## 2023-11-27 MED ORDER — SODIUM CHLORIDE 0.9% IV SOLUTION: Freq: Once | INTRAVENOUS | Status: AC

## 2023-11-27 MED ORDER — PEG 3350-KCL-NA BICARB-NACL 420 G PO SOLR
4000.0000 mL | Freq: Once | ORAL | Status: AC
  Administered 2023-11-27: 4000 mL via ORAL

## 2023-11-27 MED ORDER — ONDANSETRON HCL 4 MG PO TABS: 4.0000 mg | ORAL_TABLET | Freq: Four times a day (QID) | ORAL | Status: DC | PRN

## 2023-11-27 MED ORDER — ONDANSETRON HCL 4 MG/2ML IJ SOLN: 4.0000 mg | Freq: Four times a day (QID) | INTRAMUSCULAR | Status: DC | PRN

## 2023-11-27 MED ORDER — ACETAMINOPHEN 650 MG RE SUPP: 650.0000 mg | Freq: Four times a day (QID) | RECTAL | Status: DC | PRN

## 2023-11-27 MED ORDER — ACETAMINOPHEN 325 MG PO TABS
650.0000 mg | ORAL_TABLET | Freq: Four times a day (QID) | ORAL | Status: DC | PRN
  Filled 2023-11-27: qty 2

## 2023-11-27 MED ORDER — PANTOPRAZOLE SODIUM 40 MG IV SOLR
40.0000 mg | Freq: Two times a day (BID) | INTRAVENOUS | Status: DC
  Administered 2023-11-27 – 2023-11-30 (×7): 40 mg via INTRAVENOUS
  Filled 2023-11-27 (×7): qty 10

## 2023-11-27 MED ORDER — LORATADINE 10 MG PO TABS
10.0000 mg | ORAL_TABLET | Freq: Every day | ORAL | Status: DC
  Administered 2023-11-28 – 2023-11-30 (×3): 10 mg via ORAL
  Filled 2023-11-27 (×3): qty 1

## 2023-11-27 NOTE — Consult Note (Signed)
 @LOGO @   Referring Provider: [facility]  Primary Care Physician:  Bertell Satterfield, MD Primary Gastroenterologist:  Dr. Eartha, previously unassigned.   Date of Admission: 11/27/23 Date of Consultation: 11/27/23  Reason for Consultation:  Symptomatic Anemia  HPI:  Aaron Matthews is a 63 y.o. year old male with history of diabetes, GERD, diastolic dysfunction, HTN, HLD, who presented to the ER with complaint of dizziness, reported outpatient labs with hemoglobin of 6.  ED course: Initial vitals with heart rate 116, BP 142/66.  Hemoglobin 5.7 with microcytic indices Cr, BUN wnl.   2 units PRBCs ordered.    Started on IV pantoprazole  40 mg twice daily.   Consult:  Patient reports for the last 2 to 3 months, he has had intermittent dizziness/presyncope with associated paleness.  Unable to do much due to significant symptoms.  He has been taken out of work.  States he did have some toilet tissue hematochezia for about 3 days in June after starting gabapentin  and developing constipation.  Reports history of hemorrhoids.  Denies any other obvious blood loss such as hematuria, epistaxis.  Otherwise, has not had any significant GI symptoms.  No melena, change in bowel habits, unintentional weight loss, abdominal pain.  Chronic GERD is well-controlled on daily pantoprazole .  No dysphagia.  He has had some mild nausea in the last week when he would get dizzy, but no vomiting.  No family history of colon cancer. Reports father with history of stomach cancer.  NSAIDs: 81 mg aspirin .  ETOH: None.  Illicit drug use: None.   Last EGD: Never Last Colonoscopy: Never Cologuard 2 years ago was negative per patient's report.   Past Medical History:  Diagnosis Date   Bursitis    right shoulder   Diabetes mellitus    Diastolic dysfunction 01/18/2013   Grade 1   GERD (gastroesophageal reflux disease)    Heart murmur    as achild   Hypertension     Past Surgical History:   Procedure Laterality Date   CHOLECYSTECTOMY     dental extraction     complete   TONSILLECTOMY     TOTAL HIP ARTHROPLASTY  01/30/2012   Procedure: TOTAL HIP ARTHROPLASTY ANTERIOR APPROACH;  Surgeon: Donnice JONETTA Car, MD;  Location: [facility];  Service: Orthopedics;  Laterality: Left;    Prior to Admission medications   Medication Sig Start Date End Date Taking? Authorizing Provider  aspirin  EC 81 MG tablet Take 81 mg by mouth daily. Swallow whole.   Yes [provider]  atorvastatin (LIPITOR) 20 MG tablet Take 1 tablet by mouth daily. 07/12/20  Yes [provider]  enalapril  (VASOTEC ) 10 MG tablet Take 1 tablet daily in the morning and take the second tablet in the afternoon/evening if your systolic blood pressure (top number) is 120 or above. 01/18/13  Yes Gasper Aquas, MD  fexofenadine (ALLEGRA) 180 MG tablet Take 180 mg by mouth daily.   Yes [provider]  gabapentin  (NEURONTIN ) 300 MG capsule Take 300 mg by mouth 4 (four) times daily.   Yes [provider]  gemfibrozil (LOPID) 600 MG tablet Take 600 mg by mouth 2 (two) times daily. 08/25/20  Yes [provider]  HUMALOG KWIKPEN 100 UNIT/ML KiwkPen Inject 33 Units into the muscle 3 (three) times daily. 04/07/16  Yes [provider]  insulin  degludec (TRESIBA) 100 UNIT/ML FlexTouch Pen Inject 50 Units into the skin 2 (two) times daily.   Yes [provider]  JARDIANCE 25 MG TABS tablet  Take 25 mg by mouth daily. 09/07/20  Yes [provider]  meclizine (ANTIVERT) 25 MG tablet Take 25 mg by mouth daily as needed for dizziness. 09/24/23  Yes [provider]  metFORMIN  (GLUCOPHAGE -XR) 750 MG 24 hr tablet Take 750 mg by mouth daily with breakfast.   Yes [provider]  MOUNJARO 2.5 MG/0.5ML Pen Inject 2.5 mg into the skin once a week.   Yes [provider]  omeprazole (PRILOSEC) 20 MG capsule Take 20 mg by mouth daily.   Yes [provider]   pantoprazole  (PROTONIX ) 40 MG tablet Take 40 mg by mouth daily.   Yes [provider]  sildenafil (VIAGRA) 50 MG tablet    Yes [provider]  Continuous Blood Gluc Sensor (FREESTYLE LIBRE 2 SENSOR) MISC Apply topically. 09/09/20   [provider]  diazepam (VALIUM) 2 MG tablet TAKE 1 TABLET BY MOUTH THREE TIMES DAILY AS NEEDED FOR 15 DAYS 11/21/23   [provider]    Current Facility-Administered Medications  Medication Dose Route Frequency Provider Last Rate Last Admin   0.9 %  sodium chloride  infusion (Manually program via Guardrails IV Fluids)   Intravenous Once Bernis Ernst, PA-C       pantoprazole  (PROTONIX ) injection 40 mg  40 mg Intravenous Q12H Shah, Pratik D, DO       Current Outpatient Medications  Medication Sig Dispense Refill   aspirin  EC 81 MG tablet Take 81 mg by mouth daily. Swallow whole.     atorvastatin (LIPITOR) 20 MG tablet Take 1 tablet by mouth daily.     enalapril  (VASOTEC ) 10 MG tablet Take 1 tablet daily in the morning and take the second tablet in the afternoon/evening if your systolic blood pressure (top number) is 120 or above.     fexofenadine (ALLEGRA) 180 MG tablet Take 180 mg by mouth daily.     gabapentin  (NEURONTIN ) 300 MG capsule Take 300 mg by mouth 4 (four) times daily.     gemfibrozil (LOPID) 600 MG tablet Take 600 mg by mouth 2 (two) times daily.     HUMALOG KWIKPEN 100 UNIT/ML KiwkPen Inject 33 Units into the muscle 3 (three) times daily.     insulin  degludec (TRESIBA) 100 UNIT/ML FlexTouch Pen Inject 50 Units into the skin 2 (two) times daily.     JARDIANCE 25 MG TABS tablet Take 25 mg by mouth daily.     meclizine (ANTIVERT) 25 MG tablet Take 25 mg by mouth daily as needed for dizziness.     metFORMIN  (GLUCOPHAGE -XR) 750 MG 24 hr tablet Take 750 mg by mouth daily with breakfast.     MOUNJARO 2.5 MG/0.5ML Pen Inject 2.5 mg into the skin once a week.     omeprazole (PRILOSEC) 20 MG capsule Take 20 mg by mouth  daily.     pantoprazole  (PROTONIX ) 40 MG tablet Take 40 mg by mouth daily.     sildenafil (VIAGRA) 50 MG tablet      Continuous Blood Gluc Sensor (FREESTYLE LIBRE 2 SENSOR) MISC Apply topically.     diazepam (VALIUM) 2 MG tablet TAKE 1 TABLET BY MOUTH THREE TIMES DAILY AS NEEDED FOR 15 DAYS      Allergies as of 11/27/2023 - Review Complete 11/27/2023  Allergen Reaction Noted   Insulin  glargine Rash 11/27/2023   Penicillins Swelling and Rash 01/19/2012    History reviewed. No pertinent family history.  Social History   Socioeconomic History   Marital status: Married    Spouse name:  Not on file   Number of children: Not on file   Years of education: Not on file   Highest education level: Not on file  Occupational History   Not on file  Tobacco Use   Smoking status: Former    Current packs/day: 0.00    Average packs/day: 1 pack/day for 10.0 years (10.0 ttl pk-yrs)    Types: Cigarettes    Start date: 01/25/1983    Quit date: 01/24/1993    Years since quitting: 30.8   Smokeless tobacco: Never  Vaping Use   Vaping status: Never Used  Substance and Sexual Activity   Alcohol use: No   Drug use: No   Sexual activity: Not on file  Other Topics Concern   Not on file  Social History Narrative   Not on file   Social Drivers of Health   Financial Resource Strain: Not on file  Food Insecurity: Not on file  Transportation Needs: Not on file  Physical Activity: Not on file  Stress: Not on file  Social Connections: Not on file  Intimate Partner Violence: Not on file    Review of Systems: Gen: Denies fever, chills, cold or flulike symptoms. CV: Denies chest pain, heart palpitations. Resp: Denies shortness of breath, cough. GI: See HPI GU : Denies urinary burning, urinary frequency, urinary incontinence.  MS: Denies joint pain. Derm: Denies rash. Psych: Denies depression, anxiety. Heme: See HPI  Physical Exam: Vital signs in last 24 hours: Temp:  [98.1 F (36.7 C)]  98.1 F (36.7 C) (08/05 0936) Pulse Rate:  [102-116] 102 (08/05 1045) Resp:  [11-14] 14 (08/05 1045) BP: (117-142)/(58-66) 117/58 (08/05 0945) SpO2:  [95 %-97 %] 95 % (08/05 1045) Weight:  [861 kg] 138 kg (08/05 0935)   General:   Alert, pale, well-developed, well-nourished, pleasant and cooperative in NAD. Head:  Normocephalic and atraumatic. Eyes:  Sclera clear, no icterus.   Conjunctiva pink. Ears:  Normal auditory acuity. Lungs:  Clear throughout to auscultation anteriorly.   No wheezes, crackles, or rhonchi. No acute distress. Heart:  Regular rate and rhythm; no murmurs, clicks, rubs,  or gallops. Abdomen:  Soft, nontender and nondistended. No masses, or hernias noted. Normal bowel sounds, without guarding, and without rebound.   Rectal:  Deferred until time of colonoscopy.   Msk:  Symmetrical without gross deformities. Normal posture. Extremities:  With 1+ bilateral LE edema. Neurologic:  Alert and  oriented x4;  grossly normal neurologically. Skin:  Intact without significant lesions or rashes. Psych:  Normal mood and affect.  Intake/Output from previous day: No intake/output data recorded. Intake/Output this shift: No intake/output data recorded.  Lab Results: Recent Labs    11/27/23 1016  WBC 6.2  HGB 5.7*  HCT 23.6*  PLT 290   BMET Recent Labs    11/27/23 1016  NA 134*  K 4.2  CL 100  CO2 21*  GLUCOSE 282*  BUN 20  CREATININE 0.81  CALCIUM 9.2   LFT Recent Labs    11/27/23 1016  PROT 7.3  ALBUMIN 4.1  AST 19  ALT 15  ALKPHOS 58  BILITOT 0.8   PT/INR Recent Labs    11/27/23 1016  LABPROT 13.0  INR 0.9     Impression: 63 year old male with history of diabetes, diastolic dysfunction, HTN, who presented to the ER today with complaint of dizziness and reported outpatient labs with hemoglobin of 6.  In the ER, he was found to have hemoglobin of 5.7 with microcytic indices.  Fecal occult  blood was negative.  Creatinine and BUN within normal  limits.  GI consulted for further evaluation.  Symptomatic microcytic anemia: New onset microcytic anemia.  Suspect iron deficiency, but the studies are still pending.  Unknown baseline hemoglobin as we do not have any recent labs, but patient denies history of anemia and reports blood work yearly with PCP.  He had 3 episodes of toilet tissue hematochezia in June in the setting of constipation, but otherwise has had no overt GI bleeding or any other significant GI symptoms.  Takes 81 mg aspirin  daily, but otherwise no NSAIDs.  Chronic GERD controlled on pantoprazole  daily.  He has never had a colonoscopy.  Reports Cologuard 2 years ago that was negative.  No prior EGD.  No family history of colon cancer, but father with history of stomach cancer.  Patient needs his upper and lower GI tract evaluated with EGD and colonoscopy. If these are unrevealing, would need to proceed with capsule endoscopy.   Plan: Agree with transfusing 2 units PRBCs.  Will need H/H checked after transfusion and additional PRBCs given if needed to get hemoglobin above 7.  Monitor for overt GI bleeding.  Will plan for EGD and colonoscopy tomorrow with Dr. Shaaron.  Clear liquid diet today.  NPO at midnight.  Agree with IV PPI BID for now.    LOS: 0 days    11/27/2023, 11:42 AM   Josette Centers, PA-C [facility]

## 2023-11-27 NOTE — ED Triage Notes (Signed)
 Pt arrives with wife who reports pt has been having dizzy spells and turning white for a few months. Pt had labs drawn yesterday and hgb 6. Denies blood in stool.

## 2023-11-27 NOTE — TOC CM/SW Note (Signed)
 Transition of Care [facility]) - Inpatient Brief Assessment   Patient Details  Name: Aaron Matthews MRN: [id number] Date of Birth: 1960/11/03  Transition of Care [facility]) CM/SW Contact:    Noreen KATHEE Pinal, LCSWA Phone Number: 11/27/2023, 11:56 AM   Clinical Narrative:   Transition of Care Department [facility]) has reviewed patient and no TOC needs have been identified at this time. We will continue to monitor patient advancement through interdisciplinary progression rounds. If new patient transition needs arise, please place a TOC consult.  Transition of Care Asessment: Insurance and Status: Insurance coverage has been reviewed Patient has primary care physician: Yes Home environment has been reviewed: Single Family Home Prior level of function:: Indepedent Prior/Current Home Services: No current home services Social Drivers of Health Review: SDOH reviewed no interventions necessary Readmission risk has been reviewed: Yes Transition of care needs: no transition of care needs at this time

## 2023-11-27 NOTE — H&P (Addendum)
 History and Physical    Aaron Matthews FMW:[id number] DOB: 1960/06/03 DOA: 11/27/2023  PCP: Bertell Satterfield, MD   Patient coming from: Home  Chief Complaint: Lightheadedness, dizziness and hemoglobin of 6  HPI: Aaron Matthews is a 63 y.o. male with medical history significant for hypertension, type 2 diabetes, morbid obesity, HFpEF, GERD, dyslipidemia, and ear infection with hearing loss being followed by ENT who presented to the ED after he was noted to have hemoglobin of 6 that resulted from lab work at PCP office yesterday.  He states that he has been having some ongoing dizzy spells as well as some lightheadedness for several months, but felt that much of this was attributed to his ear issues for which she was following up with ENT in [place], [place].  He denies any dark stools or bloody stools and states that he only takes a baby aspirin  daily and no other blood thinners.  He is otherwise compliant with his home medications and denies any alcohol, NSAID, or tobacco use at this time.   ED Course: Vital signs with softer blood pressure readings and 2 unit PRBCs ordered.  Laboratory data with hemoglobin 5.7 and sodium 134.  Review of Systems: Reviewed as noted above, otherwise negative.  Past Medical History:  Diagnosis Date   Bursitis    right shoulder   Diabetes mellitus    Diastolic dysfunction 01/18/2013   Grade 1   GERD (gastroesophageal reflux disease)    Heart murmur    as achild   Hypertension     Past Surgical History:  Procedure Laterality Date   CHOLECYSTECTOMY     dental extraction     complete   TONSILLECTOMY     TOTAL HIP ARTHROPLASTY  01/30/2012   Procedure: TOTAL HIP ARTHROPLASTY ANTERIOR APPROACH;  Surgeon: Donnice JONETTA Car, MD;  Location: [facility];  Service: Orthopedics;  Laterality: Left;     reports that he quit smoking about 30 years ago. His smoking use included cigarettes. He started smoking about 40 years ago. He has a 10 pack-year smoking history. He has never  used smokeless tobacco. He reports that he does not drink alcohol and does not use drugs.  Allergies  Allergen Reactions   Insulin  Glargine Rash   Penicillins Swelling and Rash    History reviewed. No pertinent family history.  Prior to Admission medications   Medication Sig Start Date End Date Taking? Authorizing Provider  aspirin  EC 81 MG tablet Take 81 mg by mouth daily. Swallow whole.   Yes [provider]  atorvastatin (LIPITOR) 20 MG tablet Take 1 tablet by mouth daily. 07/12/20  Yes [provider]  enalapril  (VASOTEC ) 10 MG tablet Take 1 tablet daily in the morning and take the second tablet in the afternoon/evening if your systolic blood pressure (top number) is 120 or above. 01/18/13  Yes Gasper Aquas, MD  fexofenadine (ALLEGRA) 180 MG tablet Take 180 mg by mouth daily.   Yes [provider]  gabapentin  (NEURONTIN ) 300 MG capsule Take 300 mg by mouth 4 (four) times daily.   Yes [provider]  gemfibrozil (LOPID) 600 MG tablet Take 600 mg by mouth 2 (two) times daily. 08/25/20  Yes [provider]  HUMALOG KWIKPEN 100 UNIT/ML KiwkPen Inject 33 Units into the muscle 3 (three) times daily. 04/07/16  Yes [provider]  insulin  degludec (TRESIBA) 100 UNIT/ML FlexTouch Pen Inject 50 Units into the skin 2 (two) times daily.   Yes [provider]  JARDIANCE 25 MG TABS  tablet Take 25 mg by mouth daily. 09/07/20  Yes [provider]  meclizine (ANTIVERT) 25 MG tablet Take 25 mg by mouth daily as needed for dizziness. 09/24/23  Yes [provider]  metFORMIN  (GLUCOPHAGE -XR) 750 MG 24 hr tablet Take 750 mg by mouth daily with breakfast.   Yes [provider]  MOUNJARO 2.5 MG/0.5ML Pen Inject 2.5 mg into the skin once a week.   Yes [provider]  omeprazole (PRILOSEC) 20 MG capsule Take 20 mg by mouth daily.   Yes [provider]  pantoprazole  (PROTONIX ) 40 MG tablet Take 40 mg by mouth  daily.   Yes [provider]  sildenafil (VIAGRA) 50 MG tablet    Yes [provider]  Continuous Blood Gluc Sensor (FREESTYLE LIBRE 2 SENSOR) MISC Apply topically. 09/09/20   [provider]  diazepam (VALIUM) 2 MG tablet TAKE 1 TABLET BY MOUTH THREE TIMES DAILY AS NEEDED FOR 15 DAYS 11/21/23   [provider]    Physical Exam: Vitals:   11/27/23 1204 11/27/23 1228 11/27/23 1231 11/27/23 1232  BP: 119/67  (!) 113/56   Pulse: 98 (!) 102 100 98  Resp: 15 11 19 15   Temp: 98.4 F (36.9 C) 98.5 F (36.9 C)    TempSrc: Oral Oral    SpO2: 95% 98% 99% 97%  Weight:  (!) 142.8 kg    Height:  6' (1.829 m)      Constitutional: NAD, calm, comfortable, obese, appears pale Vitals:   11/27/23 1204 11/27/23 1228 11/27/23 1231 11/27/23 1232  BP: 119/67  (!) 113/56   Pulse: 98 (!) 102 100 98  Resp: 15 11 19 15   Temp: 98.4 F (36.9 C) 98.5 F (36.9 C)    TempSrc: Oral Oral    SpO2: 95% 98% 99% 97%  Weight:  (!) 142.8 kg    Height:  6' (1.829 m)     Eyes: lids and conjunctivae normal Neck: normal, supple Respiratory: clear to auscultation bilaterally. Normal respiratory effort. No accessory muscle use.  Cardiovascular: Regular rate and rhythm, no murmurs. Abdomen: no tenderness, no distention. Bowel sounds positive.  Musculoskeletal:  No edema. Skin: no rashes, lesions, ulcers.  Psychiatric: Flat affect  Labs on Admission: I have personally reviewed following labs and imaging studies  CBC: Recent Labs  Lab 11/27/23 1016  WBC 6.2  NEUTROABS 4.3  HGB 5.7*  HCT 23.6*  MCV 59.7*  PLT 290   Basic Metabolic Panel: Recent Labs  Lab 11/27/23 1016  NA 134*  K 4.2  CL 100  CO2 21*  GLUCOSE 282*  BUN 20  CREATININE 0.81  CALCIUM 9.2   GFR: Estimated Creatinine Clearance: 138.7 mL/min (by C-G formula based on SCr of 0.81 mg/dL). Liver Function Tests: Recent Labs  Lab 11/27/23 1016  AST 19  ALT 15  ALKPHOS 58  BILITOT 0.8  PROT 7.3   ALBUMIN 4.1   No results for input(s): LIPASE, AMYLASE in the last 168 hours. No results for input(s): AMMONIA in the last 168 hours. Coagulation Profile: Recent Labs  Lab 11/27/23 1016  INR 0.9   Cardiac Enzymes: No results for input(s): CKTOTAL, CKMB, CKMBINDEX, TROPONINI in the last 168 hours. BNP (last 3 results) No results for input(s): PROBNP in the last 8760 hours. HbA1C: No results for input(s): HGBA1C in the last 72 hours. CBG: No results for input(s): GLUCAP in the last 168 hours. Lipid Profile: No results for input(s): CHOL, HDL, LDLCALC, TRIG, CHOLHDL, LDLDIRECT in the last  72 hours. Thyroid Function Tests: No results for input(s): TSH, T4TOTAL, FREET4, T3FREE, THYROIDAB in the last 72 hours. Anemia Panel: Recent Labs    11/27/23 1016  RETICCTPCT 3.6*   Urine analysis:    Component Value Date/Time   COLORURINE YELLOW 01/17/2013 1411   APPEARANCEUR CLEAR 01/17/2013 1411   LABSPEC >1.030 (H) 01/17/2013 1411   PHURINE 6.0 01/17/2013 1411   GLUCOSEU 500 (A) 01/17/2013 1411   HGBUR NEGATIVE 01/17/2013 1411   BILIRUBINUR NEGATIVE 01/17/2013 1411   KETONESUR TRACE (A) 01/17/2013 1411   PROTEINUR NEGATIVE 01/17/2013 1411   UROBILINOGEN 0.2 01/17/2013 1411   NITRITE NEGATIVE 01/17/2013 1411   LEUKOCYTESUR NEGATIVE 01/17/2013 1411    Radiological Exams on Admission: No results found.  EKG: Independently reviewed.  ST 113 bpm.  Assessment/Plan Principal Problem:   Acute blood loss anemia Active Problems:   Morbid obesity (HCC)   Hyperglycemia   Hypertension   GERD (gastroesophageal reflux disease)   Diastolic dysfunction    Symptomatic acute blood loss anemia -No overt GI bleeding noted or history thereof - 2 unit PRBCs ordered - Continue to follow hemoglobin and hematocrit and transfuse further as needed - PPI twice daily - N.p.o. for now - Appreciate GI evaluation  Mild hyponatremia - Continue to  monitor closely  History of HFpEF - Prior 2D echocardiogram in 2014 with grade 1 diastolic dysfunction and preserved LVEF - Appears euvolemic, continue to monitor with PRBC administration and IV fluids  Hypertension - Hold antihypertensives given soft BP readings  Type 2 diabetes - SSI every 4 hours - Hold home oral agents  Dyslipidemia - Hold statin for now  Diabetic neuropathy - Continue gabapentin   GERD - IV PPI as above  Class III obesity - BMI 40.14   DVT prophylaxis: SCDs Code Status: Full Family Communication: Spouse at bedside 8/5 Disposition Plan: Admit for PRBC transfusion and GI evaluation Consults called: GI Admission status: Observation, stepdown  Severity of Illness: The appropriate patient status for this patient is OBSERVATION. Observation status is judged to be reasonable and necessary in order to provide the required intensity of service to ensure the patient's safety. The patient's presenting symptoms, physical exam findings, and initial radiographic and laboratory data in the context of their medical condition is felt to place them at decreased risk for further clinical deterioration. Furthermore, it is anticipated that the patient will be medically stable for discharge from the hospital within 2 midnights of admission.    Mackson Botz D Maree DO [facility]  If 7PM-7AM, please contact night-coverage [email or web address]  11/27/2023, 12:38 PM

## 2023-11-27 NOTE — ED Provider Notes (Signed)
 [facility] Provider Note   CSN: [id number] Arrival date & time: 11/27/23  9074     Patient presents with: Abnormal Lab   Aaron Matthews is a 63 y.o. male with a history of hypertension and diabetes presents to the emerged from today for evaluation of low hemoglobin.  Patient reports that he has been having lightheadedness with fatigue for the past few months.  They thought it may have been his inner ear issues however was seen in his PCP office yesterday and had labs performed that showed a hemoglobin of 6.0 and was told to present to the Emergency Department because of this.  He denies any syncope or focal weakness.  Reports he is has been feeling more tired and fatigued.  Does have more lightheadedness upon change of position such as from sitting to standing denies any room spinning sensation.  He has some shortness of breath but again attributes more to being fatigued.  Denies any chest pain.  Denies any lower leg edema.  He denies any previous history of anemia or need for blood transfusion previously.  Denies any black or bloody stools.   Abnormal Lab      Prior to Admission medications   Medication Sig Start Date End Date Taking? Authorizing Provider  aspirin  EC 81 MG tablet Take 81 mg by mouth daily. Swallow whole.   Yes [provider]  atorvastatin (LIPITOR) 20 MG tablet Take 1 tablet by mouth daily. 07/12/20  Yes [provider]  enalapril  (VASOTEC ) 10 MG tablet Take 1 tablet daily in the morning and take the second tablet in the afternoon/evening if your systolic blood pressure (top number) is 120 or above. 01/18/13  Yes Gasper Aquas, MD  fexofenadine (ALLEGRA) 180 MG tablet Take 180 mg by mouth daily.   Yes [provider]  gabapentin  (NEURONTIN ) 300 MG capsule Take 300 mg by mouth 4 (four) times daily.   Yes [provider]  gemfibrozil (LOPID) 600 MG tablet Take 600 mg by mouth 2 (two) times daily. 08/25/20  Yes [provider]  HUMALOG KWIKPEN 100 UNIT/ML KiwkPen Inject 33 Units into the muscle 3 (three) times daily. 04/07/16  Yes [provider]  insulin  degludec (TRESIBA) 100 UNIT/ML FlexTouch Pen Inject 50 Units into the skin 2 (two) times daily.   Yes [provider]  JARDIANCE 25 MG TABS tablet Take 25 mg by mouth daily. 09/07/20  Yes [provider]  meclizine (ANTIVERT) 25 MG tablet Take 25 mg by mouth daily as needed for dizziness. 09/24/23  Yes [provider]  metFORMIN  (GLUCOPHAGE -XR) 750 MG 24 hr tablet Take 750 mg by mouth daily with breakfast.   Yes [provider]  MOUNJARO 2.5 MG/0.5ML Pen Inject 2.5 mg into the skin once a week.   Yes [provider]  omeprazole (PRILOSEC) 20 MG capsule Take 20 mg by mouth daily.   Yes [provider]  pantoprazole  (PROTONIX ) 40 MG tablet Take 40 mg by mouth daily.   Yes [provider]  sildenafil (VIAGRA) 50 MG tablet    Yes [provider]  Continuous Blood Gluc Sensor (FREESTYLE LIBRE 2 SENSOR) MISC Apply topically. 09/09/20   [provider]  diazepam (VALIUM) 2 MG tablet TAKE 1 TABLET BY MOUTH THREE TIMES DAILY AS NEEDED FOR 15 DAYS 11/21/23   [provider]    Allergies: Insulin  glargine and Penicillins    Review of Systems  Constitutional:  Positive for fatigue. Negative for chills  and fever.  HENT:  Negative for congestion, rhinorrhea and sore throat.   Respiratory:  Positive for shortness of breath.   Cardiovascular:  Negative for chest pain.  Gastrointestinal:  Negative for abdominal pain, blood in stool, constipation, diarrhea, nausea and vomiting.  Genitourinary:  Negative for dysuria and hematuria.  Musculoskeletal:  Negative for gait problem.  Neurological:  Positive for light-headedness. Negative for dizziness, syncope and speech difficulty.    Updated Vital Signs BP 106/61   Pulse 92   Temp 98.7 F (37.1 C) (Oral)   Resp 17   Ht 6'  (1.829 m)   Wt (!) 142.8 kg   SpO2 95%   BMI 42.70 kg/m   Physical Exam Vitals and nursing note reviewed. Exam conducted with a chaperone present Soil scientist).  Constitutional:      Appearance: He is not toxic-appearing.  HENT:     Ears:     Comments: Some scarring seen to the left TM. Right TM unremarkable. Eyes:     General: No scleral icterus.    Comments: Pale conjunctiva  Cardiovascular:     Rate and Rhythm: Tachycardia present.  Pulmonary:     Effort: Pulmonary effort is normal. No respiratory distress.  Abdominal:     Palpations: Abdomen is soft.     Tenderness: There is no abdominal tenderness. There is no guarding or rebound.  Genitourinary:    Comments: Brown-colored stool on DRE.  No bleeding noted. Musculoskeletal:     Right lower leg: No edema.     Left lower leg: No edema.  Skin:    General: Skin is warm and dry.     Coloration: Skin is pale.  Neurological:     General: No focal deficit present.     Mental Status: He is alert.     Cranial Nerves: No cranial nerve deficit.     Motor: No weakness.     (all labs ordered are listed, but only abnormal results are displayed) Labs Reviewed  CBC WITH DIFFERENTIAL/PLATELET - Abnormal; Notable for the following components:      Result Value   RBC 3.95 (*)    Hemoglobin 5.7 (*)    HCT 23.6 (*)    MCV 59.7 (*)    MCH 14.4 (*)    MCHC 24.2 (*)    RDW 21.3 (*)    nRBC 0.3 (*)    All other components within normal limits  COMPREHENSIVE METABOLIC PANEL WITH GFR - Abnormal; Notable for the following components:   Sodium 134 (*)    CO2 21 (*)    Glucose, Bld 282 (*)    All other components within normal limits  VITAMIN B12 - Abnormal; Notable for the following components:   Vitamin B-12 170 (*)    All other components within normal limits  IRON AND TIBC - Abnormal; Notable for the following components:   Iron 11 (*)    TIBC 610 (*)    Saturation Ratios 2 (*)    All other components within normal limits  FERRITIN  - Abnormal; Notable for the following components:   Ferritin <3 (*)    All other components within normal limits  RETICULOCYTES - Abnormal; Notable for the following components:   Retic Ct Pct 3.6 (*)    RBC. 3.94 (*)    Immature Retic Fract 23.7 (*)    All other components within normal limits  GLUCOSE, CAPILLARY - Abnormal; Notable for the following components:   Glucose-Capillary 182 (*)    All other  components within normal limits  HEMOGLOBIN AND HEMATOCRIT, BLOOD - Abnormal; Notable for the following components:   Hemoglobin 7.2 (*)    HCT 26.6 (*)    All other components within normal limits  GLUCOSE, CAPILLARY - Abnormal; Notable for the following components:   Glucose-Capillary 147 (*)    All other components within normal limits  GLUCOSE, CAPILLARY - Abnormal; Notable for the following components:   Glucose-Capillary 171 (*)    All other components within normal limits  POC OCCULT BLOOD, ED - Abnormal  MRSA NEXT GEN BY PCR, NASAL  MRSA NEXT GEN BY PCR, NASAL  PROTIME-INR  APTT  FOLATE  HEMOGLOBIN A1C  HIV ANTIBODY (ROUTINE TESTING W REFLEX)  MAGNESIUM  BASIC METABOLIC PANEL WITH GFR  CBC  TYPE AND SCREEN  PREPARE RBC (CROSSMATCH)    EKG: None  Radiology: No results found.  .Critical Care  Performed by: Bernis Ernst, PA-C Authorized by: Bernis Ernst, PA-C   Critical care provider statement:    Critical care time (minutes):  45   Critical care was necessary to treat or prevent imminent or life-threatening deterioration of the following conditions: Anemia requiring blood transfusion.   Critical care was time spent personally by me on the following activities:  Development of treatment plan with patient or surrogate, discussions with consultants, evaluation of patient's response to treatment, examination of patient, ordering and review of laboratory studies, ordering and review of radiographic studies, ordering and performing treatments and interventions, pulse  oximetry, re-evaluation of patient's condition and review of old charts   Care discussed with: admitting provider      Medications Ordered in the ED  pantoprazole  (PROTONIX ) injection 40 mg (40 mg Intravenous Given 11/27/23 1234)  Chlorhexidine  Gluconate Cloth 2 % PADS 6 each (6 each Topical Given 11/27/23 1231)  gabapentin  (NEURONTIN ) capsule 300 mg (300 mg Oral Given 11/27/23 1821)  loratadine  (CLARITIN ) tablet 10 mg (has no administration in time range)  acetaminophen  (TYLENOL ) tablet 650 mg (has no administration in time range)    Or  acetaminophen  (TYLENOL ) suppository 650 mg (has no administration in time range)  ondansetron  (ZOFRAN ) tablet 4 mg (has no administration in time range)    Or  ondansetron  (ZOFRAN ) injection 4 mg (has no administration in time range)  insulin  aspart (novoLOG ) injection 0-15 Units (3 Units Subcutaneous Given 11/27/23 2035)  0.9 %  sodium chloride  infusion (Manually program via Guardrails IV Fluids) ( Intravenous New Bag/Given 11/27/23 1147)  polyethylene glycol-electrolytes (NuLYTELY ) solution 4,000 mL (4,000 mLs Oral Given 11/27/23 1822)    Medical Decision Making Amount and/or Complexity of Data Reviewed Labs: ordered.  Risk Prescription drug management. Decision regarding hospitalization.   63 y.o. male presents to the ER for evaluation of low hemoglobin. Differential diagnosis includes but is not limited to iron deficiency anemia, B12 deficiency, acute GI loss. Vital signs mildly tachycardic otherwise unremarkable. Physical exam as noted above.   Patient brought in record from outside hospital showing hemoglobin of 6.0.  Will obtain labs and fecal occult.  He reports feeling lightheaded and fatigued for a few months and was seeing his primary care doctor because of this.  They thought it was mainly an inner ear issue and has been referred to an ENT specialist at [facility].  I independently reviewed and interpreted the patient's labs.  CMP shows a sodium of 134,  bicarb of 21 with a glucose of 282.  No other electrolyte or LFT abnormality.  CBC does show hemoglobin of 5.7 with hematocrit of  23.6.  No leukocytosis.  Normal platelets.  PT/INR and APTT within normal limits.  POC occult is negative and was erroneously charted as positive.  Given the patient's decrease in hemoglobin, I have ordered 2 units of PRBC.  Patient and family members are aware the need to be admitted to the hospital for blood transfusion and workup for acute anemia.  We discussed risk and benefits of blood transfusion with patient and partner at bedside.  Patient verbalizes understanding of the risk and would like to proceed with blood transfusion.  Patient's symptoms likely due to his low blood count.  Could also be multimodal given patient's ear and vertigo issues.  He has no focal weakness we do not think any acute stroke.  Hospitalist to admit.  Portions of this report may have been transcribed using voice recognition software. Every effort was made to ensure accuracy; however, inadvertent computerized transcription errors may be present.    Final diagnoses:  Symptomatic anemia    ED Discharge Orders     None          Bernis Ernst, DEVONNA 11/27/23 2105    Suzette Pac, MD 11/28/23 1344

## 2023-11-27 NOTE — H&P (View-Only) (Signed)
 @LOGO @   Referring Provider: [facility]  Primary Care Physician:  Bertell Satterfield, MD Primary Gastroenterologist:  Dr. Eartha, previously unassigned.   Date of Admission: 11/27/23 Date of Consultation: 11/27/23  Reason for Consultation:  Symptomatic Anemia  HPI:  Aaron Matthews is a 63 y.o. year old male with history of diabetes, GERD, diastolic dysfunction, HTN, HLD, who presented to the ER with complaint of dizziness, reported outpatient labs with hemoglobin of 6.  ED course: Initial vitals with heart rate 116, BP 142/66.  Hemoglobin 5.7 with microcytic indices Cr, BUN wnl.   2 units PRBCs ordered.    Started on IV pantoprazole  40 mg twice daily.   Consult:  Patient reports for the last 2 to 3 months, he has had intermittent dizziness/presyncope with associated paleness.  Unable to do much due to significant symptoms.  He has been taken out of work.  States he did have some toilet tissue hematochezia for about 3 days in June after starting gabapentin  and developing constipation.  Reports history of hemorrhoids.  Denies any other obvious blood loss such as hematuria, epistaxis.  Otherwise, has not had any significant GI symptoms.  No melena, change in bowel habits, unintentional weight loss, abdominal pain.  Chronic GERD is well-controlled on daily pantoprazole .  No dysphagia.  He has had some mild nausea in the last week when he would get dizzy, but no vomiting.  No family history of colon cancer. Reports father with history of stomach cancer.  NSAIDs: 81 mg aspirin .  ETOH: None.  Illicit drug use: None.   Last EGD: Never Last Colonoscopy: Never Cologuard 2 years ago was negative per patient's report.   Past Medical History:  Diagnosis Date   Bursitis    right shoulder   Diabetes mellitus    Diastolic dysfunction 01/18/2013   Grade 1   GERD (gastroesophageal reflux disease)    Heart murmur    as achild   Hypertension     Past Surgical History:   Procedure Laterality Date   CHOLECYSTECTOMY     dental extraction     complete   TONSILLECTOMY     TOTAL HIP ARTHROPLASTY  01/30/2012   Procedure: TOTAL HIP ARTHROPLASTY ANTERIOR APPROACH;  Surgeon: Donnice JONETTA Car, MD;  Location: [facility];  Service: Orthopedics;  Laterality: Left;    Prior to Admission medications   Medication Sig Start Date End Date Taking? Authorizing Provider  aspirin  EC 81 MG tablet Take 81 mg by mouth daily. Swallow whole.   Yes [provider]  atorvastatin (LIPITOR) 20 MG tablet Take 1 tablet by mouth daily. 07/12/20  Yes [provider]  enalapril  (VASOTEC ) 10 MG tablet Take 1 tablet daily in the morning and take the second tablet in the afternoon/evening if your systolic blood pressure (top number) is 120 or above. 01/18/13  Yes Gasper Aquas, MD  fexofenadine (ALLEGRA) 180 MG tablet Take 180 mg by mouth daily.   Yes [provider]  gabapentin  (NEURONTIN ) 300 MG capsule Take 300 mg by mouth 4 (four) times daily.   Yes [provider]  gemfibrozil (LOPID) 600 MG tablet Take 600 mg by mouth 2 (two) times daily. 08/25/20  Yes [provider]  HUMALOG KWIKPEN 100 UNIT/ML KiwkPen Inject 33 Units into the muscle 3 (three) times daily. 04/07/16  Yes [provider]  insulin  degludec (TRESIBA) 100 UNIT/ML FlexTouch Pen Inject 50 Units into the skin 2 (two) times daily.   Yes [provider]  JARDIANCE 25 MG TABS tablet  Take 25 mg by mouth daily. 09/07/20  Yes [provider]  meclizine (ANTIVERT) 25 MG tablet Take 25 mg by mouth daily as needed for dizziness. 09/24/23  Yes [provider]  metFORMIN  (GLUCOPHAGE -XR) 750 MG 24 hr tablet Take 750 mg by mouth daily with breakfast.   Yes [provider]  MOUNJARO 2.5 MG/0.5ML Pen Inject 2.5 mg into the skin once a week.   Yes [provider]  omeprazole (PRILOSEC) 20 MG capsule Take 20 mg by mouth daily.   Yes [provider]   pantoprazole  (PROTONIX ) 40 MG tablet Take 40 mg by mouth daily.   Yes [provider]  sildenafil (VIAGRA) 50 MG tablet    Yes [provider]  Continuous Blood Gluc Sensor (FREESTYLE LIBRE 2 SENSOR) MISC Apply topically. 09/09/20   [provider]  diazepam (VALIUM) 2 MG tablet TAKE 1 TABLET BY MOUTH THREE TIMES DAILY AS NEEDED FOR 15 DAYS 11/21/23   [provider]    Current Facility-Administered Medications  Medication Dose Route Frequency Provider Last Rate Last Admin   0.9 %  sodium chloride  infusion (Manually program via Guardrails IV Fluids)   Intravenous Once Bernis Ernst, PA-C       pantoprazole  (PROTONIX ) injection 40 mg  40 mg Intravenous Q12H Shah, Pratik D, DO       Current Outpatient Medications  Medication Sig Dispense Refill   aspirin  EC 81 MG tablet Take 81 mg by mouth daily. Swallow whole.     atorvastatin (LIPITOR) 20 MG tablet Take 1 tablet by mouth daily.     enalapril  (VASOTEC ) 10 MG tablet Take 1 tablet daily in the morning and take the second tablet in the afternoon/evening if your systolic blood pressure (top number) is 120 or above.     fexofenadine (ALLEGRA) 180 MG tablet Take 180 mg by mouth daily.     gabapentin  (NEURONTIN ) 300 MG capsule Take 300 mg by mouth 4 (four) times daily.     gemfibrozil (LOPID) 600 MG tablet Take 600 mg by mouth 2 (two) times daily.     HUMALOG KWIKPEN 100 UNIT/ML KiwkPen Inject 33 Units into the muscle 3 (three) times daily.     insulin  degludec (TRESIBA) 100 UNIT/ML FlexTouch Pen Inject 50 Units into the skin 2 (two) times daily.     JARDIANCE 25 MG TABS tablet Take 25 mg by mouth daily.     meclizine (ANTIVERT) 25 MG tablet Take 25 mg by mouth daily as needed for dizziness.     metFORMIN  (GLUCOPHAGE -XR) 750 MG 24 hr tablet Take 750 mg by mouth daily with breakfast.     MOUNJARO 2.5 MG/0.5ML Pen Inject 2.5 mg into the skin once a week.     omeprazole (PRILOSEC) 20 MG capsule Take 20 mg by mouth  daily.     pantoprazole  (PROTONIX ) 40 MG tablet Take 40 mg by mouth daily.     sildenafil (VIAGRA) 50 MG tablet      Continuous Blood Gluc Sensor (FREESTYLE LIBRE 2 SENSOR) MISC Apply topically.     diazepam (VALIUM) 2 MG tablet TAKE 1 TABLET BY MOUTH THREE TIMES DAILY AS NEEDED FOR 15 DAYS      Allergies as of 11/27/2023 - Review Complete 11/27/2023  Allergen Reaction Noted   Insulin  glargine Rash 11/27/2023   Penicillins Swelling and Rash 01/19/2012    History reviewed. No pertinent family history.  Social History   Socioeconomic History   Marital status: Married    Spouse name:  Not on file   Number of children: Not on file   Years of education: Not on file   Highest education level: Not on file  Occupational History   Not on file  Tobacco Use   Smoking status: Former    Current packs/day: 0.00    Average packs/day: 1 pack/day for 10.0 years (10.0 ttl pk-yrs)    Types: Cigarettes    Start date: 01/25/1983    Quit date: 01/24/1993    Years since quitting: 30.8   Smokeless tobacco: Never  Vaping Use   Vaping status: Never Used  Substance and Sexual Activity   Alcohol use: No   Drug use: No   Sexual activity: Not on file  Other Topics Concern   Not on file  Social History Narrative   Not on file   Social Drivers of Health   Financial Resource Strain: Not on file  Food Insecurity: Not on file  Transportation Needs: Not on file  Physical Activity: Not on file  Stress: Not on file  Social Connections: Not on file  Intimate Partner Violence: Not on file    Review of Systems: Gen: Denies fever, chills, cold or flulike symptoms. CV: Denies chest pain, heart palpitations. Resp: Denies shortness of breath, cough. GI: See HPI GU : Denies urinary burning, urinary frequency, urinary incontinence.  MS: Denies joint pain. Derm: Denies rash. Psych: Denies depression, anxiety. Heme: See HPI  Physical Exam: Vital signs in last 24 hours: Temp:  [98.1 F (36.7 C)]  98.1 F (36.7 C) (08/05 0936) Pulse Rate:  [102-116] 102 (08/05 1045) Resp:  [11-14] 14 (08/05 1045) BP: (117-142)/(58-66) 117/58 (08/05 0945) SpO2:  [95 %-97 %] 95 % (08/05 1045) Weight:  [861 kg] 138 kg (08/05 0935)   General:   Alert, pale, well-developed, well-nourished, pleasant and cooperative in NAD. Head:  Normocephalic and atraumatic. Eyes:  Sclera clear, no icterus.   Conjunctiva pink. Ears:  Normal auditory acuity. Lungs:  Clear throughout to auscultation anteriorly.   No wheezes, crackles, or rhonchi. No acute distress. Heart:  Regular rate and rhythm; no murmurs, clicks, rubs,  or gallops. Abdomen:  Soft, nontender and nondistended. No masses, or hernias noted. Normal bowel sounds, without guarding, and without rebound.   Rectal:  Deferred until time of colonoscopy.   Msk:  Symmetrical without gross deformities. Normal posture. Extremities:  With 1+ bilateral LE edema. Neurologic:  Alert and  oriented x4;  grossly normal neurologically. Skin:  Intact without significant lesions or rashes. Psych:  Normal mood and affect.  Intake/Output from previous day: No intake/output data recorded. Intake/Output this shift: No intake/output data recorded.  Lab Results: Recent Labs    11/27/23 1016  WBC 6.2  HGB 5.7*  HCT 23.6*  PLT 290   BMET Recent Labs    11/27/23 1016  NA 134*  K 4.2  CL 100  CO2 21*  GLUCOSE 282*  BUN 20  CREATININE 0.81  CALCIUM 9.2   LFT Recent Labs    11/27/23 1016  PROT 7.3  ALBUMIN 4.1  AST 19  ALT 15  ALKPHOS 58  BILITOT 0.8   PT/INR Recent Labs    11/27/23 1016  LABPROT 13.0  INR 0.9     Impression: 63 year old male with history of diabetes, diastolic dysfunction, HTN, who presented to the ER today with complaint of dizziness and reported outpatient labs with hemoglobin of 6.  In the ER, he was found to have hemoglobin of 5.7 with microcytic indices.  Fecal occult  blood was negative.  Creatinine and BUN within normal  limits.  GI consulted for further evaluation.  Symptomatic microcytic anemia: New onset microcytic anemia.  Suspect iron deficiency, but the studies are still pending.  Unknown baseline hemoglobin as we do not have any recent labs, but patient denies history of anemia and reports blood work yearly with PCP.  He had 3 episodes of toilet tissue hematochezia in June in the setting of constipation, but otherwise has had no overt GI bleeding or any other significant GI symptoms.  Takes 81 mg aspirin  daily, but otherwise no NSAIDs.  Chronic GERD controlled on pantoprazole  daily.  He has never had a colonoscopy.  Reports Cologuard 2 years ago that was negative.  No prior EGD.  No family history of colon cancer, but father with history of stomach cancer.  Patient needs his upper and lower GI tract evaluated with EGD and colonoscopy. If these are unrevealing, would need to proceed with capsule endoscopy.   Plan: Agree with transfusing 2 units PRBCs.  Will need H/H checked after transfusion and additional PRBCs given if needed to get hemoglobin above 7.  Monitor for overt GI bleeding.  Will plan for EGD and colonoscopy tomorrow with Dr. Shaaron.  Clear liquid diet today.  NPO at midnight.  Agree with IV PPI BID for now.    LOS: 0 days    11/27/2023, 11:42 AM   Josette Centers, PA-C [facility]

## 2023-11-27 NOTE — Plan of Care (Signed)

## 2023-11-27 NOTE — ED Notes (Signed)
 Pts occult blood result is negative and chart wrong as positive. PA and GI notified

## 2023-11-28 ENCOUNTER — Observation Stay (HOSPITAL_COMMUNITY): Admitting: Anesthesiology

## 2023-11-28 ENCOUNTER — Encounter (HOSPITAL_COMMUNITY): Payer: Self-pay | Admitting: Internal Medicine

## 2023-11-28 ENCOUNTER — Encounter (HOSPITAL_COMMUNITY)
Admission: EM | Disposition: A | Discharge status: Home / Self Care | Payer: Self-pay | Source: Ambulatory Visit | Attending: Emergency Medicine

## 2023-11-28 DIAGNOSIS — K2970 Gastritis, unspecified, without bleeding: Secondary | ICD-10-CM

## 2023-11-28 DIAGNOSIS — C180 Malignant neoplasm of cecum: Secondary | ICD-10-CM | POA: Diagnosis not present

## 2023-11-28 DIAGNOSIS — D123 Benign neoplasm of transverse colon: Secondary | ICD-10-CM

## 2023-11-28 DIAGNOSIS — K2980 Duodenitis without bleeding: Secondary | ICD-10-CM

## 2023-11-28 DIAGNOSIS — K2990 Gastroduodenitis, unspecified, without bleeding: Secondary | ICD-10-CM | POA: Diagnosis not present

## 2023-11-28 DIAGNOSIS — K269 Duodenal ulcer, unspecified as acute or chronic, without hemorrhage or perforation: Secondary | ICD-10-CM | POA: Diagnosis not present

## 2023-11-28 DIAGNOSIS — K259 Gastric ulcer, unspecified as acute or chronic, without hemorrhage or perforation: Secondary | ICD-10-CM

## 2023-11-28 DIAGNOSIS — K635 Polyp of colon: Secondary | ICD-10-CM | POA: Diagnosis not present

## 2023-11-28 DIAGNOSIS — K922 Gastrointestinal hemorrhage, unspecified: Secondary | ICD-10-CM | POA: Diagnosis present

## 2023-11-28 DIAGNOSIS — D62 Acute posthemorrhagic anemia: Secondary | ICD-10-CM | POA: Diagnosis not present

## 2023-11-28 DIAGNOSIS — E119 Type 2 diabetes mellitus without complications: Secondary | ICD-10-CM | POA: Diagnosis not present

## 2023-11-28 DIAGNOSIS — D490 Neoplasm of unspecified behavior of digestive system: Secondary | ICD-10-CM | POA: Diagnosis not present

## 2023-11-28 DIAGNOSIS — K317 Polyp of stomach and duodenum: Secondary | ICD-10-CM

## 2023-11-28 HISTORY — PX: COLONOSCOPY: SHX5424

## 2023-11-28 HISTORY — PX: ESOPHAGOGASTRODUODENOSCOPY: SHX5428

## 2023-11-28 LAB — GLUCOSE, CAPILLARY
Glucose-Capillary: 138 mg/dL — ABNORMAL HIGH (ref 70–99)
Glucose-Capillary: 150 mg/dL — ABNORMAL HIGH (ref 70–99)
Glucose-Capillary: 152 mg/dL — ABNORMAL HIGH (ref 70–99)
Glucose-Capillary: 157 mg/dL — ABNORMAL HIGH (ref 70–99)
Glucose-Capillary: 187 mg/dL — ABNORMAL HIGH (ref 70–99)
Glucose-Capillary: 192 mg/dL — ABNORMAL HIGH (ref 70–99)
Glucose-Capillary: 196 mg/dL — ABNORMAL HIGH (ref 70–99)

## 2023-11-28 LAB — HEMOGLOBIN AND HEMATOCRIT, BLOOD
HCT: 29.4 % — ABNORMAL LOW (ref 39.0–52.0)
Hemoglobin: 7.9 g/dL — ABNORMAL LOW (ref 13.0–17.0)

## 2023-11-28 LAB — BASIC METABOLIC PANEL WITH GFR
Anion gap: 12 (ref 5–15)
BUN: 15 mg/dL (ref 8–23)
CO2: 24 mmol/L (ref 22–32)
Calcium: 9.1 mg/dL (ref 8.9–10.3)
Chloride: 101 mmol/L (ref 98–111)
Creatinine, Ser: 0.74 mg/dL (ref 0.61–1.24)
GFR, Estimated: >60 mL/min (ref >60)
Glucose, Bld: 140 mg/dL — ABNORMAL HIGH (ref 70–99)
Potassium: 3.9 mmol/L (ref 3.5–5.1)
Sodium: 137 mmol/L (ref 135–145)

## 2023-11-28 LAB — CBC
HCT: 26.3 % — ABNORMAL LOW (ref 39.0–52.0)
Hemoglobin: 6.9 g/dL — CL (ref 13.0–17.0)
MCH: 16.6 pg — ABNORMAL LOW (ref 26.0–34.0)
MCHC: 26.2 g/dL — ABNORMAL LOW (ref 30.0–36.0)
MCV: 63.2 fL — ABNORMAL LOW (ref 80.0–100.0)
Platelets: 262 K/uL (ref 150–400)
RBC: 4.16 MIL/uL — ABNORMAL LOW (ref 4.22–5.81)
RDW: 24.7 % — ABNORMAL HIGH (ref 11.5–15.5)
WBC: 6.9 K/uL (ref 4.0–10.5)
nRBC: 0 % (ref 0.0–0.2)

## 2023-11-28 LAB — HIV ANTIBODY (ROUTINE TESTING W REFLEX): HIV Screen 4th Generation wRfx: NONREACTIVE

## 2023-11-28 LAB — MAGNESIUM: Magnesium: 2.2 mg/dL (ref 1.7–2.4)

## 2023-11-28 LAB — PREPARE RBC (CROSSMATCH)

## 2023-11-28 LAB — OCCULT BLOOD, POC DEVICE: Fecal Occult Bld: NEGATIVE

## 2023-11-28 LAB — BPAM RBC
Blood Product Expiration Date: 202509062359
Unit Type and Rh: 6200

## 2023-11-28 SURGERY — EGD (ESOPHAGOGASTRODUODENOSCOPY)
Anesthesia: General

## 2023-11-28 MED ORDER — STERILE WATER FOR IRRIGATION IR SOLN
Status: DC | PRN
  Administered 2023-11-28: 60 mL

## 2023-11-28 MED ORDER — LACTATED RINGERS IV SOLN: INTRAVENOUS | Status: DC

## 2023-11-28 MED ORDER — PHENYLEPHRINE 80 MCG/ML (10ML) SYRINGE FOR IV PUSH (FOR BLOOD PRESSURE SUPPORT)
PREFILLED_SYRINGE | INTRAVENOUS | Status: DC | PRN
  Administered 2023-11-28: 80 ug via INTRAVENOUS

## 2023-11-28 MED ORDER — PROPOFOL 500 MG/50ML IV EMUL
INTRAVENOUS | Status: DC | PRN
Start: 2023-11-28 — End: 2023-11-28
  Administered 2023-11-28: 20 mg via INTRAVENOUS
  Administered 2023-11-28: 50 mg via INTRAVENOUS
  Administered 2023-11-28 (×2): 100 ug/kg/min via INTRAVENOUS
  Administered 2023-11-28: 30 mg via INTRAVENOUS
  Administered 2023-11-28: 100 ug/kg/min via INTRAVENOUS
  Administered 2023-11-28: 130 mg via INTRAVENOUS
  Administered 2023-11-28: 100 ug/kg/min via INTRAVENOUS
  Administered 2023-11-28 (×3): 40 mg via INTRAVENOUS
  Administered 2023-11-28: 20 mg via INTRAVENOUS
  Administered 2023-11-28: 50 mg via INTRAVENOUS

## 2023-11-28 MED ORDER — LABETALOL HCL 5 MG/ML IV SOLN: 10.0000 mg | INTRAVENOUS | Status: DC | PRN

## 2023-11-28 MED ORDER — SODIUM CHLORIDE 0.9% IV SOLUTION: Freq: Once | INTRAVENOUS | Status: AC

## 2023-11-28 MED ORDER — LACTATED RINGERS IV SOLN: INTRAVENOUS | Status: DC | PRN

## 2023-11-28 NOTE — Progress Notes (Addendum)
 PROGRESS NOTE  Aaron Matthews, is a 63 y.o. male, DOB - 11-May-1960, FMW:[id number]  Admit date - 11/27/2023   Admitting Physician Aaron Paglia Pearlean, MD  Outpatient Primary MD for the patient is Aaron Satterfield, MD  LOS - 0  Chief Complaint  Patient presents with   Abnormal Lab     Brief Narrative:  63 y.o. male with medical history significant for hypertension, type 2 diabetes, morbid obesity, HFpEF, GERD, dyslipidemia, and ear infection with hearing loss being followed by ENT admitted on 11/27/2023 with symptomatic acute anemia due to GI bleed and found to have ulcerated tumor in the cecum    -Assessment and Plan: 1)Presumed colon (cecal)  malignancy--- colonoscopic findings on 11/28/2023 noted with Markedly redundant and elongated colon, Ulcerated tumor in the cecum status post biopsy,  -Hepatic flexure polyp - cold biopsy removed - Pathology pending - Patient most likely need general surgery and oncology consult - GI input appreciated - 2) acute symptomatic anemia---most likely due to #1 above --Upper endoscopy on 11/28/2023 with duodenal and gastric polyps without evidence of ongoing bleeding -Hgb was down to 5.7 - Patient received 2 units of PRBC - Continue to trend Hgb  3)DM2--- Use Novolog /Humalog Sliding scale insulin  with Accu-Cheks/Fingersticks as ordered  =-Hold PTA  jardiance, metformin  and Mounjaro   -- Okay to take [place] at reduced rate of 30 units twice daily patient was taking 50 units twice daily prior to admission  4)HTN--- IV labetalol  as needed elevated BP  5)GERD--- continue Protonix   6)Morbid Obesity- -Low calorie diet, portion control and increase physical activity discussed with patient -Body mass index is 42.7 kg/m.  7) diabetic neuropathy--continue gabapentin   8)HLD--hold statins for now  9)History of HFpEF/chronic diastolic dysfunction CHF - Prior 2D echocardiogram in 2014 with grade 1 diastolic dysfunction and preserved LVEF -No acute exacerbation,  monitor closely with transfusions   Status is: Inpatient   Disposition: The patient is from: Home              Anticipated d/c is to: Home              Anticipated d/c date is: > 3 days              Patient currently is not medically stable to d/c. Barriers: Not Clinically Stable-  Code Status :  -  Code Status: Full Code   Family Communication:   Discussed with spouse at bedside  DVT Prophylaxis  :   - SCDs   SCDs Start: 11/27/23 1303  Lab Results  Component Value Date   PLT 262 11/28/2023    Inpatient Medications  Scheduled Meds:  Chlorhexidine  Gluconate Cloth  6 each Topical Q0600   gabapentin   300 mg Oral QID   insulin  aspart  0-15 Units Subcutaneous Q4H   loratadine   10 mg Oral Daily   pantoprazole  (PROTONIX ) IV  40 mg Intravenous Q12H   Continuous Infusions: PRN Meds:.acetaminophen  **OR** acetaminophen , ondansetron  **OR** ondansetron  (ZOFRAN ) IV   Anti-infectives (From admission, onward)    None       Subjective: Aaron Matthews today has no fevers, no emesis,  No chest pain,   - Family at bedside, questions answered - Recovery after EGD and colonoscopy   Objective: Vitals:   11/28/23 1330 11/28/23 1345 11/28/23 1400 11/28/23 1517  BP: 125/69 (!) 155/67 (!) 143/72 (!) 140/67  Pulse: 98 96 97 96  Resp: 16 15 17 20   Temp:   97.6 F (36.4 C) 97.8 F (36.6 C)  TempSrc:  Oral  SpO2: 97% 95% 97% 94%  Weight:      Height:        Intake/Output Summary (Last 24 hours) at 11/28/2023 1731 Last data filed at 11/28/2023 1235 Gross per 24 hour  Intake 4660 ml  Output 750 ml  Net 3910 ml   Filed Weights   11/27/23 0935 11/27/23 1228 11/28/23 1033  Weight: (!) 138 kg (!) 142.8 kg (!) 142.8 kg    Physical Exam  Gen:- Awake Alert,  in no apparent distress  HEENT:- Aaron Matthews.AT, No sclera icterus Neck-Supple Neck,No JVD,.  Lungs-  CTAB , fair symmetrical air movement CV- S1, S2 normal, regular  Abd-  +ve B.Sounds, Abd Soft, No tenderness,    Extremity/Skin:-  No  edema, pedal pulses present  Psych-affect is appropriate, oriented x3 Neuro-no new focal deficits, no tremors  Data Reviewed: I have personally reviewed following labs and imaging studies  CBC: Recent Labs  Lab 11/27/23 1016 11/27/23 1943 11/28/23 0438 11/28/23 1415  WBC 6.2  --  6.9  --   NEUTROABS 4.3  --   --   --   HGB 5.7* 7.2* 6.9* 7.9*  HCT 23.6* 26.6* 26.3* 29.4*  MCV 59.7*  --  63.2*  --   PLT 290  --  262  --    Basic Metabolic Panel: Recent Labs  Lab 11/27/23 1016 11/28/23 0438  NA 134* 137  K 4.2 3.9  CL 100 101  CO2 21* 24  GLUCOSE 282* 140*  BUN 20 15  CREATININE 0.81 0.74  CALCIUM 9.2 9.1  MG  --  2.2   GFR: Estimated Creatinine Clearance: 140.4 mL/min (by C-G formula based on SCr of 0.74 mg/dL). Liver Function Tests: Recent Labs  Lab 11/27/23 1016  AST 19  ALT 15  ALKPHOS 58  BILITOT 0.8  PROT 7.3  ALBUMIN 4.1   Recent Results (from the past 240 hours)  MRSA Next Gen by PCR, Nasal     Status: None   Collection Time: 11/27/23 12:27 PM   Specimen: Nasal Mucosa; Nasal Swab  Result Value Ref Range Status   MRSA by PCR Next Gen NOT DETECTED NOT DETECTED Final    Comment: (NOTE) The GeneXpert MRSA Assay (FDA approved for NASAL specimens only), is one component of a comprehensive MRSA colonization surveillance program. It is not intended to diagnose MRSA infection nor to guide or monitor treatment for MRSA infections. Test performance is not FDA approved in patients less than 91 years old. Performed at [facility], [address] [place], [place] [address]     Scheduled Meds:  Chlorhexidine  Gluconate Cloth  6 each Topical Q0600   gabapentin   300 mg Oral QID   insulin  aspart  0-15 Units Subcutaneous Q4H   loratadine   10 mg Oral Daily   pantoprazole  (PROTONIX ) IV  40 mg Intravenous Q12H   Continuous Infusions:   LOS: 0 days   Rendall Carwin M.D on 11/28/2023 at 5:31 PM  Go to [email or web address] - for contact info  [facility]  - Office  [phone]  If 7PM-7AM, please contact night-coverage [email or web address] 11/28/2023, 5:31 PM

## 2023-11-28 NOTE — Transfer of Care (Signed)
 Immediate Anesthesia Transfer of Care Note  Patient: Aaron Matthews  Procedure(s) Performed: EGD (ESOPHAGOGASTRODUODENOSCOPY) COLONOSCOPY  Patient Location: PACU  Anesthesia Type:General  Level of Consciousness: drowsy and patient cooperative  Airway & Oxygen  Therapy: Patient Spontanous Breathing and Patient connected to nasal cannula oxygen   Post-op Assessment: Report given to RN and Post -op Vital signs reviewed and stable  Post vital signs: Reviewed and stable  Last Vitals:  Vitals Value Taken Time  BP 153/69 11/28/23 12:51  Temp 36.8 C 11/28/23 12:51  Pulse 105 11/28/23 12:55  Resp 19 11/28/23 12:55  SpO2 97 % 11/28/23 12:55  Vitals shown include unfiled device data.  Last Pain:  Vitals:   11/28/23 1044  TempSrc:   PainSc: 0-No pain         Complications: No notable events documented.

## 2023-11-28 NOTE — Anesthesia Procedure Notes (Signed)
 Date/Time: 11/28/2023 10:42 AM  Performed by: Barbarann Verneita RAMAN, CRNAPre-anesthesia Checklist: Patient identified, Emergency Drugs available, Suction available, Timeout performed and Patient being monitored Patient Re-evaluated:Patient Re-evaluated prior to induction Oxygen  Delivery Method: Nasal cannula Comments: Optiflow

## 2023-11-28 NOTE — Op Note (Signed)
 [facility] Patient Name: Aaron Matthews Procedure Date: 11/28/2023 10:25 AM MRN: [id number] Date of Birth: [birth year] Attending MD: Lamar Ozell Hollingshead , MD, [id number] CSN: [id number] Age: 63 Admit Type: Outpatient Procedure:                Upper GI endoscopy Indications:              Suspected gastrointestinal bleeding in patient with                            unexplained iron deficiency anemia Providers:                Lamar Ozell Hollingshead, MD, Leandrew Edelman RN, RN, [place]                            Wilson, Technician Referring MD:              Medicines:                Propofol  per Anesthesia Complications:            No immediate complications. Estimated Blood Loss:     Estimated blood loss was minimal. Procedure:                Pre-Anesthesia Assessment:                           - Prior to the procedure, a History and Physical                            was performed, and patient medications and                            allergies were reviewed. The patient's tolerance of                            previous anesthesia was also reviewed. The risks                            and benefits of the procedure and the sedation                            options and risks were discussed with the patient.                            All questions were answered, and informed consent                            was obtained. Prior Anticoagulants: The patient has                            taken no anticoagulant or antiplatelet agents. ASA                            Grade Assessment: III - A patient with severe  systemic disease. After reviewing the risks and                            benefits, the patient was deemed in satisfactory                            condition to undergo the procedure.                           After obtaining informed consent, the endoscope was                            passed under direct vision. Throughout the                             procedure, the patient's blood pressure, pulse, and                            oxygen  saturations were monitored continuously. The                            [id number]-[id number] ([id number]) scope was introduced through the                            mouth, and advanced to the second part of duodenum.                            The upper GI endoscopy was accomplished without                            difficulty. The patient tolerated the procedure                            well. Scope In: 10:55:14 AM Scope Out: 11:26:16 AM Total Procedure Duration: 0 hours 31 minutes 2 seconds  Findings:      The examined esophagus was normal. Stomach empty. Patient had a 3.5       centimeter polyp along the greater curvature pedunculated. Adenomatous       in appearance. 1 adjacent 6 mm sessile polyp. Appeared adenomatous as       well. Patient had also scattered fundic gland appearing polyps studding       gastric mucosa. (1) 1 cm adenomatous appearing duodenal bulbar polyp. It       was also pedunculated. Otherwise, D1 and D2 appeared normal.      Stalk of the largest polyp in the stomach was prophylactically clipped       with (1) 360 clip. Subsequently, utilizing a 30 mm snare this polyp was       removed totally recovered with the rescue net. A second clip was placed       on the stalk to ensure hemostasis. The smaller adenomatous appearing       polyp was recovered after hot snare removal.      One of the fundal gland appearing polyps was biopsied. The duodenal       polyp was removed with hot snare 1 pass and recovered.  The stalk was       clipped x 1 with 360 clip. Impression:               - Normal esophagus. Multiple gastric polyps. 1                            large removed and clipped. Second smaller polyp                            snared. Fundic gland appearing polyps biopsied                           -Pedunculated duodenal bulbar polyp removed and                            clipped Moderate  Sedation:      Moderate (conscious) sedation was personally administered by an       anesthesia professional. The following parameters were monitored: oxygen        saturation, heart rate, blood pressure, respiratory rate, EKG, adequacy       of pulmonary ventilation, and response to care. Recommendation:           - Patient has a contact number available for                            emergencies. The signs and symptoms of potential                            delayed complications were discussed with the                            patient. Return to normal activities tomorrow.                            Written discharge instructions were provided to the                            patient.                           - Return patient to hospital ward for ongoing care.                           - Clear liquid diet. Follow-up pathology. See                            colonoscopy report. Procedure Code(s):        --- Professional ---                           [phone], Esophagogastroduodenoscopy, flexible,                            transoral; diagnostic, including collection of  specimen(s) by brushing or washing, when performed                            (separate procedure) Diagnosis Code(s):        --- Professional ---                           D50.9, Iron deficiency anemia, unspecified CPT copyright 2022 American Medical Association. All rights reserved. The codes documented in this report are preliminary and upon coder review may  be revised to meet current compliance requirements. Lamar HERO. [name], MD Lamar Ozell Hollingshead, MD 11/28/2023 12:46:22 PM This report has been signed electronically. Number of Addenda: 0

## 2023-11-28 NOTE — Interval H&P Note (Signed)
 History and Physical Interval Note:  11/28/2023 10:30 AM  Aaron Matthews  has presented today for surgery, with the diagnosis of symptomatic microcytic anemia.  The various methods of treatment have been discussed with the patient and family. After consideration of risks, benefits and other options for treatment, the patient has consented to  Procedure(s): EGD (ESOPHAGOGASTRODUODENOSCOPY) (N/A) COLONOSCOPY (N/A) as a surgical intervention.  The patient's history has been reviewed, patient examined, no change in status, stable for surgery.  I have reviewed the patient's chart and labs.  Questions were answered to the patient's satisfaction.     Estus Krakowski    Hemoglobin 6.9 this morning   After 2 units.  Third unit has been infused.     Paper hematochezia.  Otherwise no GI symptoms.  Denies dysphagia.  Agree with need for EGD and colonoscopy.  The risks, benefits, limitations, imponderables and alternatives regarding both EGD and colonoscopy have been reviewed with the patient. Questions have been answered. All parties agreeable.

## 2023-11-28 NOTE — Progress Notes (Signed)
 Tap water  enema completed. Pt tolerated well. Education provided to patient and spouse. All questions answered. Report given to dayshift to complete second tap water  edema at 0800 per order.

## 2023-11-28 NOTE — Plan of Care (Signed)

## 2023-11-28 NOTE — Progress Notes (Signed)
 Reviewed EGD and colonoscopy findings with patient and wife in his room.  Ulcerated cecal mass likely representing colorectal cancer. Will follow-up on biopsies.  Recommend surgical consultation.

## 2023-11-28 NOTE — Op Note (Signed)
 [facility] Patient Name: Aaron Matthews Procedure Date: 11/28/2023 10:21 AM MRN: [id number] Date of Birth: [birth year] Attending MD: Aaron Ozell Hollingshead , MD, [id number] CSN: [id number] Age: 63 Admit Type: Outpatient Procedure:                Colonoscopy Indications:              Heme positive stool, Iron deficiency anemia Providers:                Aaron Ozell Hollingshead, MD, Leandrew Edelman RN, RN, [place]                            Wilson, Technician Referring MD:              Medicines:                Propofol  per Anesthesia Complications:            No immediate complications. Estimated Blood Loss:     Estimated blood loss was minimal. Procedure:                Pre-Anesthesia Assessment:                           - Prior to the procedure, a History and Physical                            was performed, and patient medications and                            allergies were reviewed. The patient's tolerance of                            previous anesthesia was also reviewed. The risks                            and benefits of the procedure and the sedation                            options and risks were discussed with the patient.                            All questions were answered, and informed consent                            was obtained. Prior Anticoagulants: The patient has                            taken no anticoagulant or antiplatelet agents. ASA                            Grade Assessment: III - A patient with severe                            systemic disease. After reviewing the risks and  benefits, the patient was deemed in satisfactory                            condition to undergo the procedure.                           After obtaining informed consent, the colonoscope                            was passed under direct vision. Throughout the                            procedure, the patient's blood pressure, pulse, and                             oxygen  saturations were monitored continuously. The                            [id number] scope was introduced through the                            anus and advanced to the the cecum, identified by                            appendiceal orifice and ileocecal valve. Scope In: 11:35:16 AM Scope Out: 12:36:48 PM Scope Withdrawal Time: 0 hours 16 minutes 16 seconds  Total Procedure Duration: 1 hour 1 minute 32 seconds  Findings:      The perianal and digital rectal examinations were normal. Markedly       elongated and redundant colon requiring external abdominal pressure       strategically placed along with changing of the patient's position to       reach the cecum.      In the cecum, between the valve and the appendiceal orifice, was a       semilunar ulcerated neoplastic process. Please see photos. Biopsies       taken.      There was a 5 mm polyp at hepatic flexure which was cold snare removed       and recovered.      The exam was otherwise without abnormality on direct and retroflexion       views. Impression:               - Markedly redundant and elongated colon.                           - Ulcerated tumor in the cecum status post biopsy                           -Hepatic flexure polyp - cold biopsy removed Moderate Sedation:      Moderate (conscious) sedation was personally administered by an       anesthesia professional. The following parameters were monitored: oxygen        saturation, heart rate, blood pressure, respiratory rate, EKG, adequacy       of pulmonary ventilation, and response to care. Recommendation:           -  Clear liquid diet. Follow-up on pathology.                            Surgical consultation. See EGD findings. At patient                            request, I just spoke to [facility], spouse,                            [phone]. Reviewed findings from EGD and                            colonoscopy in detail. Anticipate need for  surgical                            and oncology consultation in the near future.                           - Continue present medications.                           - Repeat colonoscopy date to be determined after                            pending pathology results are reviewed for                            surveillance.                           - Return patient to hospital ward for ongoing care. Procedure Code(s):        --- Professional ---                           [phone], Colonoscopy, flexible; diagnostic, including                            collection of specimen(s) by brushing or washing,                            when performed (separate procedure) Diagnosis Code(s):        --- Professional ---                           R19.5, Other fecal abnormalities                           D50.9, Iron deficiency anemia, unspecified CPT copyright 2022 American Medical Association. All rights reserved. The codes documented in this report are preliminary and upon coder review may  be revised to meet current compliance requirements. Aaron HERO. Aaron Vallo, MD Aaron Ozell Hollingshead, MD 11/28/2023 1:04:20 PM This report has been signed electronically. Number of Addenda: 0

## 2023-11-28 NOTE — Progress Notes (Signed)
 Hgb is 6.9 this morning. Plan to transfuse 1 unit RBCs.

## 2023-11-28 NOTE — Plan of Care (Signed)

## 2023-11-28 NOTE — Progress Notes (Addendum)
 Pt. Drank all of bowel prep liquid per MAR and order. Pt. tolerated well.

## 2023-11-28 NOTE — Anesthesia Preprocedure Evaluation (Addendum)
 Anesthesia Evaluation  Patient identified by MRN, date of birth, ID band Patient confused  General Assessment Comment:Patient weak due to medical condition  Reviewed: Allergy & Precautions, H&P , NPO status , Patient's Chart, lab work & pertinent test results  Airway Mallampati: III  TM Distance: >3 FB Neck ROM: Full    Dental  (+) Edentulous Lower, Edentulous Upper   Pulmonary shortness of breath, at rest and lying, former smoker   Pulmonary exam normal breath sounds clear to auscultation       Cardiovascular hypertension, Normal cardiovascular exam+ Valvular Problems/Murmurs  Rhythm:Regular Rate:Tachycardia     Neuro/Psych negative neurological ROS  negative psych ROS   GI/Hepatic Neg liver ROS,GERD  ,,  Endo/Other  diabetes  Class 3 obesity  Renal/GU negative Renal ROS  negative genitourinary   Musculoskeletal negative musculoskeletal ROS (+)    Abdominal Normal abdominal exam  (+)   Peds negative pediatric ROS (+)  Hematology  (+) Blood dyscrasia, anemia Severe microcytic anemia Has received 3 Units PRBC's since admission yesterday   Anesthesia Other Findings   Reproductive/Obstetrics negative OB ROS                              Anesthesia Physical Anesthesia Plan  ASA: 4 and emergent  Anesthesia Plan: General   Post-op Pain Management:    Induction: Intravenous  PONV Risk Score and Plan:   Airway Management Planned: Nasal Cannula  Additional Equipment:   Intra-op Plan:   Post-operative Plan: Extubation in OR  Informed Consent: I have reviewed the patients History and Physical, chart, labs and discussed the procedure including the risks, benefits and alternatives for the proposed anesthesia with the patient or authorized representative who has indicated his/her understanding and acceptance.     Dental advisory given  Plan Discussed with: CRNA  Anesthesia Plan  Comments:          Anesthesia Quick Evaluation

## 2023-11-29 ENCOUNTER — Telehealth: Payer: Self-pay | Admitting: Gastroenterology

## 2023-11-29 ENCOUNTER — Inpatient Hospital Stay (HOSPITAL_COMMUNITY)

## 2023-11-29 ENCOUNTER — Encounter (HOSPITAL_COMMUNITY): Payer: Self-pay | Admitting: Internal Medicine

## 2023-11-29 DIAGNOSIS — K219 Gastro-esophageal reflux disease without esophagitis: Secondary | ICD-10-CM

## 2023-11-29 DIAGNOSIS — C180 Malignant neoplasm of cecum: Secondary | ICD-10-CM

## 2023-11-29 DIAGNOSIS — I700 Atherosclerosis of aorta: Secondary | ICD-10-CM | POA: Diagnosis not present

## 2023-11-29 DIAGNOSIS — D62 Acute posthemorrhagic anemia: Secondary | ICD-10-CM | POA: Diagnosis not present

## 2023-11-29 DIAGNOSIS — C189 Malignant neoplasm of colon, unspecified: Secondary | ICD-10-CM

## 2023-11-29 DIAGNOSIS — C787 Secondary malignant neoplasm of liver and intrahepatic bile duct: Secondary | ICD-10-CM | POA: Diagnosis not present

## 2023-11-29 DIAGNOSIS — D509 Iron deficiency anemia, unspecified: Secondary | ICD-10-CM | POA: Diagnosis not present

## 2023-11-29 DIAGNOSIS — K769 Liver disease, unspecified: Secondary | ICD-10-CM | POA: Diagnosis not present

## 2023-11-29 LAB — HEMOGLOBIN A1C
Hgb A1c MFr Bld: 8 % — ABNORMAL HIGH (ref 4.8–5.6)
Mean Plasma Glucose: 183 mg/dL

## 2023-11-29 LAB — GLUCOSE, CAPILLARY
Glucose-Capillary: 140 mg/dL — ABNORMAL HIGH (ref 70–99)
Glucose-Capillary: 157 mg/dL — ABNORMAL HIGH (ref 70–99)
Glucose-Capillary: 159 mg/dL — ABNORMAL HIGH (ref 70–99)
Glucose-Capillary: 166 mg/dL — ABNORMAL HIGH (ref 70–99)
Glucose-Capillary: 167 mg/dL — ABNORMAL HIGH (ref 70–99)
Glucose-Capillary: 168 mg/dL — ABNORMAL HIGH (ref 70–99)
Glucose-Capillary: 185 mg/dL — ABNORMAL HIGH (ref 70–99)
Glucose-Capillary: 203 mg/dL — ABNORMAL HIGH (ref 70–99)

## 2023-11-29 LAB — BPAM RBC
Blood Product Expiration Date: 202508312359
ISSUE DATE / TIME: 202508312359
ISSUE DATE / TIME: 202508051148
ISSUE DATE / TIME: 202508051508
ISSUING PHYSICIAN: 202508051148
PRODUCT CODE: 202508060728
PRODUCT CODE: 202509062359
Unit Type and Rh: 6200
Unit Type and Rh: 202508312359
Unit Type and Rh: 6200
Unit Type and Rh: 6200

## 2023-11-29 LAB — TYPE AND SCREEN
ABO/RH(D): A POS
Antibody Screen: NEGATIVE
Unit division: 0
Unit division: 0
Unit division: 0

## 2023-11-29 LAB — HEMOGLOBIN AND HEMATOCRIT, BLOOD
HCT: 27.9 % — ABNORMAL LOW (ref 39.0–52.0)
Hemoglobin: 7.3 g/dL — ABNORMAL LOW (ref 13.0–17.0)

## 2023-11-29 LAB — SURGICAL PATHOLOGY

## 2023-11-29 MED ORDER — METRONIDAZOLE 500 MG PO TABS
1000.0000 mg | ORAL_TABLET | Freq: Once | ORAL | Status: AC
  Administered 2023-11-29: 1000 mg via ORAL
  Filled 2023-11-29: qty 2

## 2023-11-29 MED ORDER — NEOMYCIN SULFATE 500 MG PO TABS
1000.0000 mg | ORAL_TABLET | Freq: Once | ORAL | Status: AC
  Administered 2023-11-29: 1000 mg via ORAL
  Filled 2023-11-29: qty 2

## 2023-11-29 MED ORDER — IOHEXOL 9 MG/ML PO SOLN
ORAL | Status: AC
  Filled 2023-11-29: qty 1000

## 2023-11-29 MED ORDER — SODIUM CHLORIDE 0.9 % IV SOLN
2.0000 g | INTRAVENOUS | Status: DC
  Filled 2023-11-29: qty 2

## 2023-11-29 MED ORDER — CHLORHEXIDINE GLUCONATE CLOTH 2 % EX PADS: 6.0000 | MEDICATED_PAD | Freq: Once | CUTANEOUS | Status: DC

## 2023-11-29 MED ORDER — NEOMYCIN SULFATE 500 MG PO TABS
1000.0000 mg | ORAL_TABLET | Freq: Once | ORAL | Status: AC
  Administered 2023-11-30: 1000 mg via ORAL
  Filled 2023-11-29: qty 2

## 2023-11-29 MED ORDER — CHLORHEXIDINE GLUCONATE CLOTH 2 % EX PADS
6.0000 | MEDICATED_PAD | Freq: Once | CUTANEOUS | Status: AC
  Administered 2023-11-30: 6 via TOPICAL

## 2023-11-29 MED ORDER — CELECOXIB 100 MG PO CAPS: 200.0000 mg | ORAL_CAPSULE | ORAL | Status: DC

## 2023-11-29 MED ORDER — ACETAMINOPHEN 500 MG PO TABS: 1000.0000 mg | ORAL_TABLET | ORAL | Status: DC

## 2023-11-29 MED ORDER — ALVIMOPAN 12 MG PO CAPS: 12.0000 mg | ORAL_CAPSULE | ORAL | Status: DC

## 2023-11-29 MED ORDER — ENOXAPARIN SODIUM 40 MG/0.4ML IJ SOSY
40.0000 mg | PREFILLED_SYRINGE | Freq: Once | INTRAMUSCULAR | Status: AC
  Administered 2023-11-29: 40 mg via SUBCUTANEOUS
  Filled 2023-11-29: qty 0.4

## 2023-11-29 MED ORDER — IOHEXOL 300 MG/ML  SOLN
100.0000 mL | Freq: Once | INTRAMUSCULAR | Status: AC | PRN
  Administered 2023-11-29: 100 mL via INTRAVENOUS

## 2023-11-29 NOTE — Progress Notes (Signed)
 Dr. Norleen Dover just made contact to inform me cecal biopsies were positive for adenocarcinoma.

## 2023-11-29 NOTE — Progress Notes (Signed)
 PROGRESS NOTE  Aaron Matthews, is a 63 y.o. male, DOB - [birth year], FMW:[id number]  Admit date - 11/27/2023   Admitting Physician Nolin Grell Pearlean, MD  Outpatient Primary MD for the patient is Bertell Satterfield, MD  LOS - 1  Chief Complaint  Patient presents with   Abnormal Lab     Brief Narrative:  63 y.o. 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  MRI abdomen as per liver mass protocol requested -- There are multiple, sub 4 mm, solid, calcified and noncalcified nodules throughout bilateral lungs. These are indeterminate in this patient with known history of malignancy. Attention on follow-up examination is recommended. -General Surgery consult appreciated---tentatively scheduled for partial colectomy on 11/30/2023 pending MRI abdomen finding to rule out metastatic liver lesion -GI input appreciated -Patient will need oncology follow-up - 2) acute symptomatic anemia---most likely due to #1 above --Upper endoscopy on 11/28/2023 with duodenal and gastric polyps without evidence of ongoing bleeding -Colonoscopy on 11/28/2023 with cecal mass consistent with malignancy - Hgb up to 7.9 from 5.7 after transfusion of 2 units of  PRBC  3)DM2--- Use Novolog /Humalog Sliding scale insulin  with Accu-Cheks/Fingersticks as ordered  =-Hold PTA  jardiance, metformin  and Mounjaro   -- -hold [place] as patient will be n.p.o. for possible surgery in a.m. Use Novolog /Humalog Sliding scale insulin  with Accu-Cheks/Fingersticks as ordered   4)HTN--- IV labetalol  as needed elevated BP  5)GERD--- continue Protonix   6)Morbid Obesity- -Low calorie diet, portion control and increase physical activity discussed with patient -Body mass index is 42.7 kg/m.  7) diabetic neuropathy--continue gabapentin   8)HLD--hold statins for now  9)History of HFpEF/chronic diastolic dysfunction CHF - Prior 2D echocardiogram in 2014 with grade 1 diastolic dysfunction and preserved LVEF -No acute exacerbation, monitor closely with transfusions   Status is: Inpatient   Disposition: The patient is from: Home              Anticipated d/c is to: Home              Anticipated d/c date is: > 3 days              Patient currently is not medically stable to d/c. Barriers: Not Clinically Stable-  Code Status :  -  Code Status: Full Code   Family Communication:   Discussed with spouse at bedside  DVT Prophylaxis  :   - SCDs   SCDs Start: 11/27/23 1303  Lab Results  Component Value Date   PLT 262 11/28/2023    Inpatient Medications  Scheduled Meds:  Chlorhexidine  Gluconate Cloth  6 each Topical Q0600   gabapentin   300 mg Oral QID   insulin  aspart  0-15 Units Subcutaneous Q4H   loratadine   10 mg Oral Daily   metroNIDAZOLE   1,000 mg Oral Once   neomycin   1,000 mg Oral Once  pantoprazole  (PROTONIX ) IV  40 mg Intravenous Q12H   Continuous Infusions: PRN Meds:.acetaminophen  **OR** acetaminophen , labetalol , ondansetron  **OR** ondansetron  (ZOFRAN ) IV   Anti-infectives (From admission, onward)    Start     Dose/Rate Route Frequency Ordered Stop   11/29/23 2230  neomycin  (MYCIFRADIN ) tablet 1,000 mg       Placed in Followed by Linked  Group   1,000 mg Oral  Once 11/29/23 1340     11/29/23 2230  metroNIDAZOLE  (FLAGYL ) tablet 1,000 mg       Placed in Followed by Linked Group   1,000 mg Oral Once 11/29/23 1340     11/29/23 1530  neomycin  (MYCIFRADIN ) tablet 1,000 mg       Placed in Followed by Linked Group   1,000 mg Oral  Once 11/29/23 1340 11/29/23 1648   11/29/23 1530  metroNIDAZOLE  (FLAGYL ) tablet 1,000 mg       Placed in Followed by Linked Group   1,000 mg Oral Once 11/29/23 1340 11/29/23 1647   11/29/23 1430  metroNIDAZOLE  (FLAGYL ) tablet 1,000 mg       Placed in Followed by Linked Group   1,000 mg Oral Once 11/29/23 1340 11/29/23 1547   11/29/23 1400  neomycin  (MYCIFRADIN ) tablet 1,000 mg       Placed in Followed by Linked Group   1,000 mg Oral  Once 11/29/23 1340 11/29/23 1548       Subjective: Aaron Matthews today has no fevers, no emesis,  No chest pain,   - Wife at bedside -- Oral intake is fair  Objective: Vitals:   11/28/23 1517 11/28/23 1930 11/29/23 0358 11/29/23 1328  BP: (!) 140/67 (!) 134/59 (!) 135/48 135/65  Pulse: 96 98 97 (!) 103  Resp: 20 18 18    Temp: 97.8 F (36.6 C) 98.3 F (36.8 C) 98.8 F (37.1 C) 98.6 F (37 C)  TempSrc: Oral Oral Axillary Oral  SpO2: 94% 97% 91% 97%  Weight:      Height:        Intake/Output Summary (Last 24 hours) at 11/29/2023 1848 Last data filed at 11/29/2023 1700 Gross per 24 hour  Intake 480 ml  Output --  Net 480 ml   Filed Weights   11/27/23 0935 11/27/23 1228 11/28/23 1033  Weight: (!) 138 kg (!) 142.8 kg (!) 142.8 kg    Physical Exam  Gen:- Awake Alert,  in no apparent distress  HEENT:- [place].AT, No sclera icterus Neck-Supple Neck,No JVD,.  Lungs-  CTAB , fair symmetrical air movement CV- S1, S2 normal, regular  Abd-  +ve B.Sounds, Abd Soft, No tenderness,    Extremity/Skin:- No  edema, pedal pulses present  Psych-affect is appropriate, oriented x3 Neuro-no new focal deficits, no tremors  Data Reviewed: I have personally  reviewed following labs and imaging studies  CBC: Recent Labs  Lab 11/27/23 1016 11/27/23 1943 11/28/23 0438 11/28/23 1415 11/29/23 0442  WBC 6.2  --  6.9  --   --   NEUTROABS 4.3  --   --   --   --   HGB 5.7* 7.2* 6.9* 7.9* 7.3*  HCT 23.6* 26.6* 26.3* 29.4* 27.9*  MCV 59.7*  --  63.2*  --   --   PLT 290  --  262  --   --    Basic Metabolic Panel: Recent Labs  Lab 11/27/23 1016 11/28/23 0438  NA 134* 137  K 4.2 3.9  CL 100 101  CO2 21* 24  GLUCOSE 282* 140*  BUN 20  15  CREATININE 0.81 0.74  CALCIUM 9.2 9.1  MG  --  2.2   GFR: Estimated Creatinine Clearance: 140.4 mL/min (by C-G formula based on SCr of 0.74 mg/dL). Liver Function Tests: Recent Labs  Lab 11/27/23 1016  AST 19  ALT 15  ALKPHOS 58  BILITOT 0.8  PROT 7.3  ALBUMIN 4.1   Recent Results (from the past 240 hours)  MRSA Next Gen by PCR, Nasal     Status: None   Collection Time: 11/27/23 12:27 PM   Specimen: Nasal Mucosa; Nasal Swab  Result Value Ref Range Status   MRSA by PCR Next Gen NOT DETECTED NOT DETECTED Final    Comment: (NOTE) The GeneXpert MRSA Assay (FDA approved for NASAL specimens only), is one component of a comprehensive MRSA colonization surveillance program. It is not intended to diagnose MRSA infection nor to guide or monitor treatment for MRSA infections. Test performance is not FDA approved in patients less than 3 years old. Performed at [facility], [address] [place], [place] [address]     Scheduled Meds:  Chlorhexidine  Gluconate Cloth  6 each Topical Q0600   gabapentin   300 mg Oral QID   insulin  aspart  0-15 Units Subcutaneous Q4H   loratadine   10 mg Oral Daily   metroNIDAZOLE   1,000 mg Oral Once   neomycin   1,000 mg Oral Once   pantoprazole  (PROTONIX ) IV  40 mg Intravenous Q12H   Continuous Infusions:   LOS: 1 day   Rendall Carwin M.D on 11/29/2023 at 6:48 PM  Go to [email or web address] - for contact info  [facility] - Office  [phone]  If 7PM-7AM,  please contact night-coverage [email or web address] 11/29/2023, 6:48 PM

## 2023-11-29 NOTE — Consult Note (Signed)
 [facility] Consult  Reason for Consult:Cecal adenocarcinoma Referring Physician: Dr. Pearlean  Chief Complaint   Abnormal Lab     HPI: Aaron Matthews is a 63 y.o. male who was admitted to the hospital with acute on chronic anemia.  He had some routine blood work performed by his primary care doctor, and was noted to have a hemoglobin of 5.9 outpatient.  He received 2 units PRBCs while in the emergency department, and decision was made to admit the patient for EGD and colonoscopy.  He underwent these procedures yesterday, and EGD demonstrated a normal esophagus with multiple gastric polyps.  Colonoscopy demonstrated an ulcerated tumor in the cecum and a hepatic flexure polyp.  Pathology did come back on the tumor as cecal adenocarcinoma.  Patient denies ever having any bloody bowel movements (excluding 1 time when he had bright red blood after straining with constipation).  He has never had a colonoscopy prior to yesterday's procedure.  He had always performed Cologuard, and those have been negative.  His past medical history significant for diabetes, GERD, and hypertension.  He has not on any blood thinning medications.  He has a history of an open cholecystectomy in the past.  Past Medical History:  Diagnosis Date   Bursitis    right shoulder   Diabetes mellitus    Diastolic dysfunction 01/18/2013   Grade 1   GERD (gastroesophageal reflux disease)    Heart murmur    as achild   Hypertension     Past Surgical History:  Procedure Laterality Date   CHOLECYSTECTOMY     dental extraction     complete   TONSILLECTOMY     TOTAL HIP ARTHROPLASTY  01/30/2012   Procedure: TOTAL HIP ARTHROPLASTY ANTERIOR APPROACH;  Surgeon: Donnice JONETTA Car, MD;  Location: [facility];  Service: Orthopedics;  Laterality: Left;    History reviewed. No pertinent family history.  Social History   Tobacco Use   Smoking status: Former    Current packs/day: 0.00    Average packs/day: 1 pack/day  for 10.0 years (10.0 ttl pk-yrs)    Types: Cigarettes    Start date: 01/25/1983    Quit date: 01/24/1993    Years since quitting: 30.8   Smokeless tobacco: Never  Vaping Use   Vaping status: Never Used  Substance Use Topics   Alcohol use: No   Drug use: No    Medications: I have reviewed the patient's current medications.  Allergies  Allergen Reactions   Insulin  Glargine Rash   Penicillins Swelling and Rash     ROS:  Pertinent items are noted in HPI.  Blood pressure 135/65, pulse (!) 103, temperature 98.6 F (37 C), temperature source Oral, resp. rate 18, height 6' (1.829 m), weight (!) 142.8 kg, SpO2 97%. Physical Exam Vitals reviewed.  Constitutional:      Appearance: Normal appearance. He is obese.  HENT:     Head: Normocephalic and atraumatic.  Eyes:     Extraocular Movements: Extraocular movements intact.     Pupils: Pupils are equal, round, and reactive to light.  Cardiovascular:     Rate and Rhythm: Normal rate.  Pulmonary:     Effort: Pulmonary effort is normal.  Abdominal:     Comments: Abdomen soft, nondistended, no percussion tenderness, nontender to palpation; no rigidity, guarding, rebound tenderness  Musculoskeletal:        General: Normal range of motion.     Cervical back: Normal range of motion.  Skin:    General: Skin  is warm and dry.  Neurological:     General: No focal deficit present.     Mental Status: He is alert and oriented to person, place, and time.  Psychiatric:        Mood and Affect: Mood normal.        Behavior: Behavior normal.     Results: Results for orders placed or performed during the hospital encounter of 11/27/23 (from the past 48 hours)  Glucose, capillary     Status: Abnormal   Collection Time: 11/27/23  4:18 PM  Result Value Ref Range   Glucose-Capillary 147 (H) 70 - 99 mg/dL    Comment: Glucose reference range applies only to samples taken after fasting for at least 8 hours.  Hemoglobin A1c     Status: Abnormal    Collection Time: 11/27/23  7:43 PM  Result Value Ref Range   Hgb A1c MFr Bld 8.0 (H) 4.8 - 5.6 %    Comment: (NOTE)         Prediabetes: 5.7 - 6.4         Diabetes: >6.4         Glycemic control for adults with diabetes: <7.0    Mean Plasma Glucose 183 mg/dL    Comment: (NOTE) Performed At: [facility] [address] [place], [place] [id number] Jennette Shorter MD Ey:[phone]   Hemoglobin and hematocrit, blood     Status: Abnormal   Collection Time: 11/27/23  7:43 PM  Result Value Ref Range   Hemoglobin 7.2 (L) 13.0 - 17.0 g/dL   HCT 73.3 (L) 60.9 - 47.9 %    Comment: Performed at [facility], [address], [place], [place] [address]  Glucose, capillary     Status: Abnormal   Collection Time: 11/27/23  8:03 PM  Result Value Ref Range   Glucose-Capillary 171 (H) 70 - 99 mg/dL    Comment: Glucose reference range applies only to samples taken after fasting for at least 8 hours.  Glucose, capillary     Status: Abnormal   Collection Time: 11/28/23 12:14 AM  Result Value Ref Range   Glucose-Capillary 138 (H) 70 - 99 mg/dL    Comment: Glucose reference range applies only to samples taken after fasting for at least 8 hours.  Glucose, capillary     Status: Abnormal   Collection Time: 11/28/23  4:01 AM  Result Value Ref Range   Glucose-Capillary 152 (H) 70 - 99 mg/dL    Comment: Glucose reference range applies only to samples taken after fasting for at least 8 hours.  HIV Antibody (routine testing w rflx)     Status: None   Collection Time: 11/28/23  4:38 AM  Result Value Ref Range   HIV Screen 4th Generation wRfx Non Reactive Non Reactive    Comment: Performed at [facility], [address], [place], [place] [address]  Magnesium     Status: None   Collection Time: 11/28/23  4:38 AM  Result Value Ref Range   Magnesium 2.2 1.7 - 2.4 mg/dL    Comment: Performed at [facility], [address] [place], [place] [address]  Basic metabolic panel     Status: Abnormal    Collection Time: 11/28/23  4:38 AM  Result Value Ref Range   Sodium 137 135 - 145 mmol/L   Potassium 3.9 3.5 - 5.1 mmol/L   Chloride 101 98 - 111 mmol/L   CO2 24 22 - 32 mmol/L   Glucose, Bld 140 (H) 70 -  99 mg/dL    Comment: Glucose reference range applies only to samples taken after fasting for at least 8 hours.   BUN 15 8 - 23 mg/dL   Creatinine, Ser 9.25 0.61 - 1.24 mg/dL   Calcium 9.1 8.9 - 89.6 mg/dL   GFR, Estimated >39 >39 mL/min    Comment: (NOTE) Calculated using the CKD-EPI Creatinine Equation (2021)    Anion gap 12 5 - 15    Comment: Performed at [facility], [address] [place], [place] [address]  CBC     Status: Abnormal   Collection Time: 11/28/23  4:38 AM  Result Value Ref Range   WBC 6.9 4.0 - 10.5 K/uL   RBC 4.16 (L) 4.22 - 5.81 MIL/uL   Hemoglobin 6.9 (LL) 13.0 - 17.0 g/dL    Comment: REPEATED TO VERIFY Reticulocyte Hemoglobin testing may be clinically indicated, consider ordering this additional test [id number] This result has been called to L. IRVING by [id number] on 11/28/2023 06:27:26, and has been read back.    HCT 26.3 (L) 39.0 - 52.0 %   MCV 63.2 (L) 80.0 - 100.0 fL   MCH 16.6 (L) 26.0 - 34.0 pg   MCHC 26.2 (L) 30.0 - 36.0 g/dL   RDW 75.2 (H) 88.4 - 84.4 %   Platelets 262 150 - 400 K/uL   nRBC 0.0 0.0 - 0.2 %    Comment: Performed at [facility], [address], [place], [place] [address]  Glucose, capillary     Status: Abnormal   Collection Time: 11/28/23  7:43 AM  Result Value Ref Range   Glucose-Capillary 157 (H) 70 - 99 mg/dL    Comment: Glucose reference range applies only to samples taken after fasting for at least 8 hours.  Glucose, capillary     Status: Abnormal   Collection Time: 11/28/23 10:08 AM  Result Value Ref Range   Glucose-Capillary 150 (H) 70 - 99 mg/dL    Comment: Glucose reference range applies only to samples taken after fasting for at least 8 hours.  Surgical pathology     Status: None   Collection Time: 11/28/23 11:03 AM   Result Value Ref Range   SURGICAL PATHOLOGY      SURGICAL PATHOLOGY CASE: [phone] PATIENT: Aaron Matthews Surgical Pathology Report     Clinical History: symptomatic microcytic anemia (tb)     FINAL MICROSCOPIC DIAGNOSIS:  A.   STOMACH, POLYPECTOMY: Hyperplastic gastric polyp with inflammation and focal erosion. Negative for dysplasia and malignancy.  B.   DUODENUM, POLYPECTOMY: Hyperplastic gastric polyp with mild inflammation and focal erosion. Negative for dysplasia and malignancy.  C.   FUNDAL GLAND POLYP: Fundic gland polyp. Negative for dysplasia and malignancy.  D.   COLON, HEPATIC FLEXURE, POLYPECTOMY: Tubular adenoma (s) without high grade dysplasia.  E.   CECAL MASS BIOPSY: Invasive moderately differentiated adenocarcinoma. See comment.  COMMENT: E.  Dr. Delana agrees.  Dr. Shaaron was notified on 11/29/2023.  GROSS DESCRIPTION: Specimen A: Received in formalin are 2 pink-red polypoid tissues, 0.35 cm and 1.7 cm. Block summary: Block 1 = smaller tissue, in toto Block 2 = larger  tissue, sectioned  Specimen B: Received in formalin is a 1.5 cm pink-red polyp which is sectioned and entirely submitted 1 block.  Specimen C: Received in formalin are 4 irregular to polypoid tan soft tissues which range from 0.1 to 0.7 cm, in toto, one block.  Specimen D: Received in formalin are tan, soft tissue fragments that are submitted in toto. Number:  5.  Size: 0.2 to 0.4 cm.  Blocks: 1  Specimen E: Received in formalin are 2 tan-pink polypoid tissues, 0.4 and 0.5 cm.  In toto, one block.  SW 11/28/2023    Final Diagnosis performed by Norleen Dover, MD.   Electronically signed 11/29/2023 Technical component performed at [facility], [address] [place], [place] [address].  Professional component performed at [employer], [address] [place], [place] [address].  Immunohistochemistry Technical component (if  applicable) was performed at [facility]. [address], [place], [place]  [address].   IMMUNOHISTOCHEMISTRY DISCLAIMER (if applicable): Some of these immunohistochemical stains may have been developed and the performance characteristics determine by [facility]. Some may not have been cleared or approved by the U.S. Food and Drug Administration. The FDA has determined that such clearance or approval is not necessary. This test is used for clinical purposes. It should not be regarded as investigational or for research. This laboratory is certified under the Clinical Laboratory Improvement Amendments of 1988 (CLIA-88) as qualified to perform high complexity clinical laboratory testing.  The controls stained appropriately.   IHC stains are performed on formalin fixed, paraffin embedded tissue using a 3,3diaminobenzidine (DAB) chromogen and Leica Bond Autostainer System. The staining intensity of the nucleus is score manually and is reported as the percentage of tumor cell nuclei demonstrating specific nuclear staining. The specimens are  fixed in 10% Neutral Formalin for at least 6 hours and up to 72hrs. These tests are validated on decalcified tissue. Results should be interpreted with caution given the possibility of false negative results on decalcified specimens. Antibody Clones are as follows ER-clone 49F, PR-clone 16, Ki67- clone MM1. Some of these immunohistochemical stains may have been developed and the performance characteristics determined by [facility].   Glucose, capillary     Status: Abnormal   Collection Time: 11/28/23 12:53 PM  Result Value Ref Range   Glucose-Capillary 196 (H) 70 - 99 mg/dL    Comment: Glucose reference range applies only to samples taken after fasting for at least 8 hours.  Hemoglobin and hematocrit, blood     Status: Abnormal   Collection Time: 11/28/23  2:15 PM  Result Value Ref Range   Hemoglobin  7.9 (L) 13.0 - 17.0 g/dL   HCT 70.5 (L) 60.9 - 47.9 %    Comment: Performed at [facility], [address], [place], [place] [address]  Glucose, capillary     Status: Abnormal   Collection Time: 11/28/23  4:16 PM  Result Value Ref Range   Glucose-Capillary 187 (H) 70 - 99 mg/dL    Comment: Glucose reference range applies only to samples taken after fasting for at least 8 hours.  Glucose, capillary     Status: Abnormal   Collection Time: 11/28/23  7:49 PM  Result Value Ref Range   Glucose-Capillary 192 (H) 70 - 99 mg/dL    Comment: Glucose reference range applies only to samples taken after fasting for at least 8 hours.  Glucose, capillary     Status: Abnormal   Collection Time: 11/29/23 12:01 AM  Result Value Ref Range   Glucose-Capillary 140 (H) 70 - 99 mg/dL    Comment: Glucose reference range applies only to samples taken after fasting for at least 8 hours.  Glucose, capillary     Status: Abnormal   Collection Time: 11/29/23  4:01 AM  Result Value Ref Range   Glucose-Capillary 159 (H) 70 - 99  mg/dL    Comment: Glucose reference range applies only to samples taken after fasting for at least 8 hours.  Glucose, capillary     Status: Abnormal   Collection Time: 11/29/23  4:17 AM  Result Value Ref Range   Glucose-Capillary 167 (H) 70 - 99 mg/dL    Comment: Glucose reference range applies only to samples taken after fasting for at least 8 hours.  Hemoglobin and hematocrit, blood     Status: Abnormal   Collection Time: 11/29/23  4:42 AM  Result Value Ref Range   Hemoglobin 7.3 (L) 13.0 - 17.0 g/dL   HCT 72.0 (L) 60.9 - 47.9 %    Comment: Performed at [facility], [address] [place], [place] [address]  Glucose, capillary     Status: Abnormal   Collection Time: 11/29/23  7:22 AM  Result Value Ref Range   Glucose-Capillary 168 (H) 70 - 99 mg/dL    Comment: Glucose reference range applies only to samples taken after fasting for at least 8 hours.   Comment 1 Notify RN    Comment 2  Document in Chart   Glucose, capillary     Status: Abnormal   Collection Time: 11/29/23 11:10 AM  Result Value Ref Range   Glucose-Capillary 203 (H) 70 - 99 mg/dL    Comment: Glucose reference range applies only to samples taken after fasting for at least 8 hours.    No results found.   Assessment & Plan:  Aaron Matthews is a 63 y.o. male who was admitted with acute on chronic anemia.  Patient underwent colonoscopy which demonstrated ulcerative mass of the cecum, and pathology demonstrated adenocarcinoma.  Imaging and blood work evaluated by myself.  -We discussed colon cancer, and need for surgical excision.  Given that he is currently inpatient and underwent a colonoscopy prep 2 days ago, we will plan to proceed with surgery tomorrow -The risk and benefits of right hemicolectomy were discussed including but not limited to bleeding, infection, injury to surrounding structures, need for additional procedures, and anastomotic leak.  After careful consideration, Aaron Matthews has decided to proceed with surgery.  Plan for surgery tomorrow afternoon. -Maintain clear liquids today -Antibiotic prep ordered today -NPO at midnight -CT of the chest, abdomen, and pelvis have been ordered.  This imaging study will be performed this afternoon -He will follow-up with oncology postoperatively, at which time determination will be made whether he will need adjuvant treatment -Further recommendations to follow surgery -Appreciate hospitalist and GI recommendations  All questions were answered to the satisfaction of the patient and family.  Note: Portions of this report may have been transcribed using voice recognition software. Every effort has been made to ensure accuracy; however, inadvertent computerized transcription errors may still be present.   -- Dorothyann Brittle, DO [facility] [address] [place], [place] [address] [phone] (office)

## 2023-11-29 NOTE — Anesthesia Postprocedure Evaluation (Signed)
 Anesthesia Post Note  Patient: Aaron Matthews  Procedure(s) Performed: EGD (ESOPHAGOGASTRODUODENOSCOPY) COLONOSCOPY  Patient location during evaluation: PACU Anesthesia Type: General Level of consciousness: awake and alert Pain management: pain level controlled Vital Signs Assessment: post-procedure vital signs reviewed and stable Respiratory status: spontaneous breathing, nonlabored ventilation, respiratory function stable and patient connected to nasal cannula oxygen  Cardiovascular status: stable and blood pressure returned to baseline Postop Assessment: no apparent nausea or vomiting Anesthetic complications: no   No notable events documented.   Last Vitals:  Vitals:   11/28/23 1930 11/29/23 0358  BP: (!) 134/59 (!) 135/48  Pulse: 98 97  Resp: 18 18  Temp: 36.8 C 37.1 C  SpO2: 97% 91%    Last Pain:  Vitals:   11/29/23 0358  TempSrc: Axillary  PainSc:                  Andrea Limes

## 2023-11-29 NOTE — Progress Notes (Addendum)
 Gastroenterology Progress Note   Referring Provider: No ref. provider found Primary Care Physician:  Bertell Satterfield, MD Primary Gastroenterologist: Lamar HERO.Rourk, MD  Patient ID: Aaron Matthews; [id number]; Mar 27, 1961   Subjective   Passing some gas. Denies abdominal pain. Requesting potential surgery prior to discharge to avoid needing to re prep. Energy somewhat improved. No dizziness.   Objective   Vital signs in last 24 hours Temp:  [97.6 F (36.4 C)-98.8 F (37.1 C)] 98.6 F (37 C) (08/07 1328) Pulse Rate:  [96-103] 103 (08/07 1328) Resp:  [15-20] 18 (08/07 0358) BP: (134-155)/(48-72) 135/65 (08/07 1328) SpO2:  [91 %-97 %] 97 % (08/07 1328) Last BM Date : 11/28/23  Physical Exam General:   Alert and oriented, pleasant Head:  Normocephalic and atraumatic. Eyes:  No icterus, sclera clear. Conjuctiva pink.  Ears: HOH to left ear.  Mouth:  Without lesions, mucosa pink and moist.  Abdomen:  Bowel sounds present, soft, non-tender, non-distended. No HSM or hernias noted. No rebound or guarding. No masses appreciated  Neurologic:  Alert and  oriented x4;  grossly normal neurologically.SABRA Psych:  Alert and cooperative. Normal mood and affect.  Intake/Output from previous day: 08/06 0701 - 08/07 0700 In: 660 [I.V.:660] Out: -  Intake/Output this shift: Total I/O In: 240 [P.O.:240] Out: -   Lab Results  Recent Labs    11/27/23 1016 11/27/23 1943 11/28/23 0438 11/28/23 1415 11/29/23 0442  WBC 6.2  --  6.9  --   --   HGB 5.7*   < > 6.9* 7.9* 7.3*  HCT 23.6*   < > 26.3* 29.4* 27.9*  PLT 290  --  262  --   --    < > = values in this interval not displayed.   BMET Recent Labs    11/27/23 1016 11/28/23 0438  NA 134* 137  K 4.2 3.9  CL 100 101  CO2 21* 24  GLUCOSE 282* 140*  BUN 20 15  CREATININE 0.81 0.74  CALCIUM 9.2 9.1   LFT Recent Labs    11/27/23 1016  PROT 7.3  ALBUMIN 4.1  AST 19  ALT 15  ALKPHOS 58  BILITOT 0.8   PT/INR Recent Labs     11/27/23 1016  LABPROT 13.0  INR 0.9   Hepatitis Panel No results for input(s): HEPBSAG, HCVAB, HEPAIGM, HEPBIGM in the last 72 hours.   Studies/Results No results found.  Assessment  63 y.o. male with a history of diastolic dysfunction, diabetes, HTN, previously ruptured left eardrum who presented to the hospital on 8/5 with reports of dizziness and outpatient labs with a hemoglobin of 6.  Stool was heme-negative however given severe anemia GI was consulted for further evaluation.  Symptomatic microcytic anemia: Hgb 5.7 on arrival. Iron studies with low iron and ferritin. In June had 2 episodes of hematochezia in the setting of constipation but otherwise no GI bleeding. No AC or NSAIDs other than baby aspirin  . Maintained on PPI daily for GERD.   Negative Cologuard 2 years ago.   Underwent colonoscopy and EGD yesterday with Dr. Shaaron.  EGD with normal esophagus and multiple gastric polyps.  Single large polyp removed and clipped with a second smaller polyp which was snared.  Fundic gland appearing polyps were biopsied and pedunculated duodenal bulbar polyp removed and clipped.  Colonoscopy with markedly redundant and elongated colon with an ulcerated tumor in the cecum s/p biopsy as well as removal of the hepatic flexure polyp.  He is placed on clear liquid diet.  Dr. Shaaron updated patient and his wife with recommendation for anticipated surgical and oncology consultation and repeat colonoscopy to be determined pending clinical course.  Pathology confirmed invasive moderately differentiated adenocarcinoma.   Hgb stable this morning at 7.3 after receiving blood products  Discussed with Dr. Evonnie  and planning on potentially adding him on for colectomy tomorrow.Has outpatient follow up with oncology scheduled for 8/13 @ 1pm.   Plan / Recommendations  May consider IV iron load prior to discharge. Recommend urgent outpatient oncology follow up [place] for now given  potential OR tomorrow. Monitor for overt GI bleeding Trend H/H Daily PPI CTA chest, abdomen, pelvis to assess for metastasis CEA Consider outpatient follow up in the next 4-6 weeks.   Given undergoing surgical procedure, GI will sign off. Will arrange outpatient follow up.    LOS: 1 day   11/29/2023, 1:31 PM  Charmaine Melia, MSN, FNP-BC, AGACNP-BC [facility]

## 2023-11-29 NOTE — Telephone Encounter (Signed)
 Please arrange hospital follow-up in 4 to 6 weeks with myself or Dr. Shaaron.

## 2023-11-30 ENCOUNTER — Inpatient Hospital Stay (HOSPITAL_COMMUNITY)

## 2023-11-30 ENCOUNTER — Encounter (HOSPITAL_COMMUNITY): Payer: Self-pay | Admitting: Anesthesiology

## 2023-11-30 ENCOUNTER — Ambulatory Visit: Payer: Self-pay | Admitting: Internal Medicine

## 2023-11-30 ENCOUNTER — Encounter (HOSPITAL_COMMUNITY)
Admission: EM | Disposition: A | Discharge status: Home / Self Care | Payer: Self-pay | Source: Ambulatory Visit | Attending: Emergency Medicine

## 2023-11-30 DIAGNOSIS — N281 Cyst of kidney, acquired: Secondary | ICD-10-CM | POA: Diagnosis not present

## 2023-11-30 DIAGNOSIS — C220 Liver cell carcinoma: Secondary | ICD-10-CM | POA: Diagnosis not present

## 2023-11-30 DIAGNOSIS — K769 Liver disease, unspecified: Secondary | ICD-10-CM | POA: Diagnosis not present

## 2023-11-30 DIAGNOSIS — C787 Secondary malignant neoplasm of liver and intrahepatic bile duct: Secondary | ICD-10-CM | POA: Diagnosis not present

## 2023-11-30 DIAGNOSIS — R162 Hepatomegaly with splenomegaly, not elsewhere classified: Secondary | ICD-10-CM | POA: Diagnosis not present

## 2023-11-30 DIAGNOSIS — C189 Malignant neoplasm of colon, unspecified: Secondary | ICD-10-CM | POA: Diagnosis not present

## 2023-11-30 DIAGNOSIS — D62 Acute posthemorrhagic anemia: Secondary | ICD-10-CM | POA: Diagnosis not present

## 2023-11-30 LAB — GLUCOSE, CAPILLARY
Glucose-Capillary: 160 mg/dL — ABNORMAL HIGH (ref 70–99)
Glucose-Capillary: 206 mg/dL — ABNORMAL HIGH (ref 70–99)
Glucose-Capillary: 189 mg/dL — ABNORMAL HIGH (ref 70–99)

## 2023-11-30 LAB — BASIC METABOLIC PANEL WITH GFR
Anion gap: 10 (ref 5–15)
BUN: 8 mg/dL (ref 8–23)
CO2: 25 mmol/L (ref 22–32)
Calcium: 9.1 mg/dL (ref 8.9–10.3)
Chloride: 102 mmol/L (ref 98–111)
Creatinine, Ser: 0.71 mg/dL (ref 0.61–1.24)
GFR, Estimated: >60 mL/min (ref >60)
Glucose, Bld: 198 mg/dL — ABNORMAL HIGH (ref 70–99)
Potassium: 3.7 mmol/L (ref 3.5–5.1)
Sodium: 137 mmol/L (ref 135–145)

## 2023-11-30 LAB — CBC
HCT: 28.9 % — ABNORMAL LOW (ref 39.0–52.0)
Hemoglobin: 7.8 g/dL — ABNORMAL LOW (ref 13.0–17.0)
MCH: 17.8 pg — ABNORMAL LOW (ref 26.0–34.0)
MCHC: 27 g/dL — ABNORMAL LOW (ref 30.0–36.0)
MCV: 65.8 fL — ABNORMAL LOW (ref 80.0–100.0)
Platelets: 262 K/uL (ref 150–400)
RBC: 4.39 MIL/uL (ref 4.22–5.81)
RDW: 27.1 % — ABNORMAL HIGH (ref 11.5–15.5)
WBC: 6.4 K/uL (ref 4.0–10.5)
nRBC: 0 % (ref 0.0–0.2)

## 2023-11-30 LAB — CEA: CEA: 60.8 ng/mL — ABNORMAL HIGH (ref 0.0–4.7)

## 2023-11-30 SURGERY — COLECTOMY, RIGHT
Anesthesia: General

## 2023-11-30 MED ORDER — GADOBUTROL 1 MMOL/ML IV SOLN
10.0000 mL | Freq: Once | INTRAVENOUS | Status: AC | PRN
  Administered 2023-11-30: 10 mL via INTRAVENOUS

## 2023-11-30 SURGICAL SUPPLY — 46 items
BAG HAMPER (MISCELLANEOUS) ×1 IMPLANT
BARRIER SKIN 2 1/4 (OSTOMY) IMPLANT
BARRIER SKIN OD2.25 2 3/4 FLNG (OSTOMY) IMPLANT
CLAMP POUCH DRAINAGE QUIET (OSTOMY) IMPLANT
COUNTER NEEDLE MAGNETIC 40 RED (SET/KITS/TRAYS/PACK) ×1 IMPLANT
COVER LIGHT HANDLE (MISCELLANEOUS) IMPLANT
COVER LIGHT HANDLE STERIS (MISCELLANEOUS) ×4 IMPLANT
DRSG OPSITE POSTOP 4X10 (GAUZE/BANDAGES/DRESSINGS) IMPLANT
DRSG OPSITE POSTOP 4X8 (GAUZE/BANDAGES/DRESSINGS) IMPLANT
ELECTRODE REM PT RTRN 9FT ADLT (ELECTROSURGICAL) ×1 IMPLANT
GLOVE BIOGEL PI IND STRL 6.5 (GLOVE) ×1 IMPLANT
GLOVE BIOGEL PI IND STRL 7.0 (GLOVE) ×2 IMPLANT
GLOVE SURG SS PI 6.5 STRL IVOR (GLOVE) ×3 IMPLANT
GOWN STRL REUS W/TWL LRG LVL3 (GOWN DISPOSABLE) ×6 IMPLANT
INST SET MAJOR GENERAL (KITS) ×1 IMPLANT
KIT TURNOVER KIT A (KITS) ×1 IMPLANT
LIGASURE IMPACT 36 18CM CVD LR (INSTRUMENTS) ×1 IMPLANT
MANIFOLD NEPTUNE II (INSTRUMENTS) ×1 IMPLANT
NEEDLE HYPO 18GX1.5 BLUNT FILL (NEEDLE) ×1 IMPLANT
NEEDLE HYPO 21X1.5 SAFETY (NEEDLE) ×1 IMPLANT
NS IRRIG 1000ML POUR BTL (IV SOLUTION) ×2 IMPLANT
PACK COLON (CUSTOM PROCEDURE TRAY) ×1 IMPLANT
PAD ARMBOARD POSITIONER FOAM (MISCELLANEOUS) ×1 IMPLANT
PENCIL SMOKE EVACUATOR COATED (MISCELLANEOUS) ×1 IMPLANT
POSITIONER HEAD 8X9X4 ADT (SOFTGOODS) ×1 IMPLANT
POUCH OSTOMY 2 PC DRNBL 2.25 (WOUND CARE) IMPLANT
POUCH OSTOMY 2 PC DRNBL 2.75 (WOUND CARE) IMPLANT
RELOAD STAPLE 55 3.8 BLU REG (ENDOMECHANICALS) IMPLANT
RELOAD STAPLE 75 3.8 BLU REG (ENDOMECHANICALS) IMPLANT
RETRACTOR WND ALEXIS-O 25 LRG (MISCELLANEOUS) IMPLANT
RETRACTOR WOUND ALXS 18CM MED (MISCELLANEOUS) IMPLANT
SHEET LAVH (DRAPES) IMPLANT
SPONGE T-LAP 18X18 ~~LOC~~+RFID (SPONGE) ×3 IMPLANT
STAPLER GUN LINEAR PROX 60 (STAPLE) ×1 IMPLANT
STAPLER PROXIMATE 55 BLUE (STAPLE) IMPLANT
STAPLER PROXIMATE 75MM BLUE (STAPLE) IMPLANT
STAPLER VISISTAT (STAPLE) ×1 IMPLANT
SUT CHROMIC 0 SH (SUTURE) IMPLANT
SUT CHROMIC 2 0 SH (SUTURE) IMPLANT
SUT CHROMIC 3 0 SH 27 (SUTURE) IMPLANT
SUT PDS AB CT VIOLET #0 27IN (SUTURE) ×2 IMPLANT
SUT SILK 3 0 SH CR/8 (SUTURE) ×1 IMPLANT
SUT VIC AB 3-0 SH 27X BRD (SUTURE) ×1 IMPLANT
SYR 30ML LL (SYRINGE) ×1 IMPLANT
TRAY FOLEY MTR SLVR 16FR STAT (SET/KITS/TRAYS/PACK) ×1 IMPLANT
YANKAUER SUCT BULB TIP 10FT TU (MISCELLANEOUS) ×1 IMPLANT

## 2023-11-30 NOTE — Progress Notes (Signed)
 [facility] Progress Note  2 Days Post-Op  Subjective: Patient seen and examined.  He is resting comfortably in bed.  He had 2 liquid bowel movements yesterday.  He denies abdominal pain.  He has been NPO since midnight.  Objective: Vital signs in last 24 hours: Temp:  [98.6 F (37 C)-99 F (37.2 C)] 98.6 F (37 C) (08/08 0356) Pulse Rate:  [88-103] 88 (08/08 0356) Resp:  [18] 18 (08/08 0356) BP: (135-143)/(65-76) 138/76 (08/08 0356) SpO2:  [93 %-97 %] 96 % (08/08 0356) Last BM Date : 11/30/23  Intake/Output from previous day: 08/07 0701 - 08/08 0700 In: 480 [P.O.:480] Out: -  Intake/Output this shift: No intake/output data recorded.  General appearance: alert, cooperative, and no distress GI: Abdomen soft, nondistended, no percussion tenderness, nontender to palpation; no rigidity, guarding, rebound tenderness  Lab Results:  Recent Labs    11/28/23 0438 11/28/23 1415 11/29/23 0442 11/30/23 0505  WBC 6.9  --   --  6.4  HGB 6.9*   < > 7.3* 7.8*  HCT 26.3*   < > 27.9* 28.9*  PLT 262  --   --  262   < > = values in this interval not displayed.   BMET Recent Labs    11/28/23 0438 11/30/23 0505  NA 137 137  K 3.9 3.7  CL 101 102  CO2 24 25  GLUCOSE 140* 198*  BUN 15 8  CREATININE 0.74 0.71  CALCIUM 9.1 9.1   PT/INR No results for input(s): LABPROT, INR in the last 72 hours.  Studies/Results: MR LIVER W WO CONTRAST Result Date: 11/30/2023 CLINICAL DATA:  Hepatocellular carcinoma suspected Liver Cancer. EXAM: MRI ABDOMEN WITHOUT AND WITH CONTRAST TECHNIQUE: Multiplanar multisequence MR imaging of the abdomen was performed both before and after the administration of intravenous contrast. CONTRAST:  10mL GADAVIST  GADOBUTROL  1 MMOL/ML IV SOLN COMPARISON:  CT scan chest, abdomen and pelvis from 11/29/2023. FINDINGS: Lower chest: Unremarkable MR appearance to the lung bases. No pleural effusion. No pericardial effusion. Normal heart size.  Hepatobiliary: The liver is mild-to-moderately enlarged in size. Noncirrhotic configuration. There are at least 5 mildly T2 hyperintense, target like (peripherally irregular rim enhancing) lesions throughout the liver (marked with electronic arrow sign on series 14), compatible with metastases. The dominant lesion is in the right hepatic lobe, 5 measuring 2.9 x 3.2 cm, which corresponds to the observations described on the CT scan from yesterday. The second largest lesion is in the left hepatic lobe, segment 2 near the capsule. These lesions show diffusion restriction on high B value images. The hepatic and portal veins are patent. No intrahepatic or extrahepatic bile duct dilatation. No choledocholithiasis. Status post cholecystectomy. Pancreas: No mass, inflammatory changes or other parenchymal abnormality identified. No main pancreatic duct dilation. Spleen: Mildly enlarged spleen measuring up to 8.9 x 15.7 cm orthogonally on coronal plane. No focal lesion. Adrenals/Urinary Tract: Unremarkable adrenal glands. No hydroureteronephrosis. There is a subcentimeter sized proteinaceous/hemorrhagic cyst in the left kidney interpolar region (series 7 and 14, image 68). There are several additional subcentimeter sized simple cortical cysts/sinus cysts in the left kidney. No suspicious renal lesion. Stomach/Bowel: Visualized portions within the abdomen are unremarkable. No disproportionate dilation of bowel loops. Vascular/Lymphatic: No pathologically enlarged lymph nodes identified. No abdominal aortic aneurysm demonstrated. No ascites. Other: There are 2 areas of susceptibility artifacts in the region of pylorus/proximal duodenum, which corresponds to the metallic clips seen on the prior CT scan. There is another area of susceptibility artifact near the  ileocecal junction region, which also favors a metallic clip. Musculoskeletal: There is a 1.4 x 2.1 cm hemangioma in the L3 vertebral body. Otherwise no suspicious lesion.  Minimal degenerative changes of spine noted. IMPRESSION: 1. There are at least 5 hepatic lesions, compatible with metastases. The dominant lesion is in the right hepatic lobe, segment 5 measuring 2.9 x 3.2 cm. 2. Multiple other nonacute observations, as described above. Electronically Signed   By: Ree Molt M.D.   On: 11/30/2023 09:39   CT CHEST ABDOMEN PELVIS W CONTRAST Result Date: 11/29/2023 CLINICAL DATA:  Colon cancer, staging. * Tracking Code: BO * EXAM: CT CHEST, ABDOMEN, AND PELVIS WITH CONTRAST TECHNIQUE: Multidetector CT imaging of the chest, abdomen and pelvis was performed following the standard protocol during bolus administration of intravenous contrast. RADIATION DOSE REDUCTION: This exam was performed according to the departmental dose-optimization program which includes automated exposure control, adjustment of the mA and/or kV according to patient size and/or use of iterative reconstruction technique. CONTRAST:  OMNIPAQUE  IOHEXOL  300 MG/ML  SOLN COMPARISON:  None Available. FINDINGS: CT CHEST FINDINGS Cardiovascular: Normal cardiac size. No pericardial effusion. No aortic aneurysm. There are coronary artery calcifications, in keeping with coronary artery disease. There are also mild peripheral atherosclerotic vascular calcifications of thoracic aorta and its major branches. Mediastinum/Nodes: Visualized thyroid gland appears grossly unremarkable. No solid / cystic mediastinal masses. The esophagus is nondistended precluding optimal assessment. No axillary, mediastinal or hilar lymphadenopathy by size criteria. Lungs/Pleura: The central tracheo-bronchial tree is patent. There are patchy areas of linear, plate-like atelectasis and/or scarring throughout bilateral lungs. No mass or consolidation. No pleural effusion or pneumothorax. There multiple, sub 4 mm, solid, calcified and noncalcified nodules throughout bilateral lungs (marked with electronic arrow sign on series 101). These are  indeterminate in this patient with known history of malignancy. Attention on follow-up examination is recommended. Musculoskeletal: The visualized soft tissues of the chest wall are grossly unremarkable. No suspicious osseous lesions. There are mild multilevel degenerative changes in the visualized spine. CT ABDOMEN PELVIS FINDINGS Hepatobiliary: The liver is normal in size. Non-cirrhotic configuration. There is a single ill-defined hypoattenuating 2.4 x 2.7 cm lesion in the right hepatic lobe, segment 5, which is incompletely characterized on the current exam. Further evaluation with nonemergent MRI abdomen as per liver mass protocol is recommended. No intrahepatic or extrahepatic bile duct dilation. Gallbladder is surgically absent. Pancreas: Unremarkable. No pancreatic ductal dilatation or surrounding inflammatory changes. Spleen: Within normal limits. No focal lesion. Adrenals/Urinary Tract: Adrenal glands are unremarkable. No suspicious renal mass. No hydronephrosis. No renal or ureteric calculi. Unremarkable urinary bladder. Stomach/Bowel: No disproportionate dilation of the small or large bowel loops. There is irregular wall thickening of the cecum measuring up to 1.2-1.3 cm which is incompletely evaluated on the current exam. Please correlate with prior colonoscopy. Appendix is unremarkable. There are metallic linear structures in the proximal duodenum, likely from prior intervention. Correlate with history. Vascular/Lymphatic: No ascites or pneumoperitoneum. There are 2 predominantly calcified structures in the right upper quadrant between the liver and duodenum, which are of unknown etiology but favored benign. These may be likely dropped gallstones from prior cholecystectomy. No abdominal or pelvic lymphadenopathy, by size criteria. No aneurysmal dilation of the major abdominal arteries. There are mild peripheral atherosclerotic vascular calcifications of the aorta and its major branches. Reproductive:  Normal size prostate. Symmetric seminal vesicles. Other: There are small fat containing umbilical and left inguinal hernias. The soft tissues and abdominal wall are otherwise unremarkable. Musculoskeletal: No  suspicious osseous lesions. There are mild multilevel degenerative changes in the visualized spine. Left hip arthroplasty noted. L3 vertebral body hemangioma noted. IMPRESSION: 1. There is a single ill-defined hypoattenuating 2.4 x 2.7 cm lesion in the right hepatic lobe, segment 5, which is incompletely characterized on the current exam. Further evaluation with nonemergent MRI abdomen as per liver mass protocol is recommended. 2. There are multiple, sub 4 mm, solid, calcified and noncalcified nodules throughout bilateral lungs. These are indeterminate in this patient with known history of malignancy. Attention on follow-up examination is recommended. 3. Otherwise no metastatic disease identified within the chest, abdomen or pelvis. 4. Multiple other nonacute observations, as described above. Aortic Atherosclerosis (ICD10-I70.0). Electronically Signed   By: Ree Molt M.D.   On: 11/29/2023 16:47    Anti-infectives: Anti-infectives (From admission, onward)    Start     Dose/Rate Route Frequency Ordered Stop   11/30/23 0600  cefoTEtan  (CEFOTAN ) 2 g in sodium chloride  0.9 % 100 mL IVPB        2 g 200 mL/hr over 30 Minutes Intravenous On call to O.R. 11/29/23 2131 12/01/23 0559   11/29/23 2230  neomycin  (MYCIFRADIN ) tablet 1,000 mg       Placed in Followed by Linked Group   1,000 mg Oral  Once 11/29/23 1340 11/30/23 0001   11/29/23 2230  metroNIDAZOLE  (FLAGYL ) tablet 1,000 mg       Placed in Followed by Linked Group   1,000 mg Oral Once 11/29/23 1340 11/29/23 2157   11/29/23 1530  neomycin  (MYCIFRADIN ) tablet 1,000 mg       Placed in Followed by Linked Group   1,000 mg Oral  Once 11/29/23 1340 11/29/23 1648   11/29/23 1530  metroNIDAZOLE  (FLAGYL ) tablet 1,000 mg       Placed in  Followed by Linked Group   1,000 mg Oral Once 11/29/23 1340 11/29/23 1647   11/29/23 1430  metroNIDAZOLE  (FLAGYL ) tablet 1,000 mg       Placed in Followed by Linked Group   1,000 mg Oral Once 11/29/23 1340 11/29/23 1547   11/29/23 1400  neomycin  (MYCIFRADIN ) tablet 1,000 mg       Placed in Followed by Linked Group   1,000 mg Oral  Once 11/29/23 1340 11/29/23 1548       Assessment/Plan:  Patient is a 63 year old male who was admitted with acute on chronic anemia.  He underwent colonoscopy which demonstrated an ulcerative mass of the cecum, and pathology demonstrated moderately differentiated adenocarcinoma.  -CT of the chest, abdomen, and pelvis on 8/7 demonstrated a single ill-defined hypoattenuating 2.4 x 2.7 cm lesion in the right hepatic lobe, recommending MRI with liver mass protocol for further evaluation -Liver MRI demonstrates at least 5 hepatic lesions, compatible with metastases with the dominant lesion in the right hepatic lobe, segment 5 measuring 2.9 x 3.2 cm -CEA 60.8 -Discussed the imaging findings with the patient and his family.  Unfortunately, given he has likely metastatic disease, we are not going to proceed with surgery today -He will require our IR guided biopsy to confirm that these lesions are in fact metastases from his colon -He will then need to follow-up with oncology to discuss next steps in treatment.  His case will likely be discussed at a tumor board and he will potentially need evaluation by surgical oncology.  Patient is scheduled to follow-up with oncology on 8/13 -If patient not undergoing IR biopsy today, okay for diet from surgical standpoint -Care per hospitalist and oncology -General  Surgery to sign off.  Please call with any questions or concerns   LOS: 2 days    Shanae Luo A Joniece Smotherman 11/30/2023  Note: Portions of this report may have been transcribed using voice recognition software. Every effort has been made to ensure accuracy; however,  inadvertent computerized transcription errors may still be present.

## 2023-11-30 NOTE — Discharge Instructions (Signed)
 1)Avoid ibuprofen/Advil/Aleve/Motrin/Goody Powders/Naproxen/BC powders/Meloxicam/Diclofenac/Indomethacin and other Nonsteroidal anti-inflammatory medications as these will make you more likely to bleed and can cause stomach ulcers, can also cause Kidney problems.   2)  interventional radiology department from [facility]--- will call you on Monday, 12/03/2023 to confirm time and date for the liver biopsy procedure--- tentatively your Liver biopsy procedure may happen on Wednesday, 12/05/2023 but this may change. - You have to be fasting on the day of your liver biopsy procedure  4)Dr. Mickiel Kandala--oncology office will call you to reschedule your oncology appointment which need to take place after the pathology results from the liver biopsy is available

## 2023-11-30 NOTE — Plan of Care (Signed)
  Problem: Education: Goal: Knowledge of General Education information will improve Description: Including pain rating scale, medication(s)/side effects and non-pharmacologic comfort measures Outcome: Adequate for Discharge   Problem: Health Behavior/Discharge Planning: Goal: Ability to manage health-related needs will improve Outcome: Adequate for Discharge   Problem: Clinical Measurements: Goal: Ability to maintain clinical measurements within normal limits will improve Outcome: Adequate for Discharge Goal: Will remain free from infection Outcome: Adequate for Discharge Goal: Diagnostic test results will improve Outcome: Adequate for Discharge Goal: Respiratory complications will improve Outcome: Adequate for Discharge Goal: Cardiovascular complication will be avoided Outcome: Adequate for Discharge   Problem: Activity: Goal: Risk for activity intolerance will decrease Outcome: Adequate for Discharge   Problem: Nutrition: Goal: Adequate nutrition will be maintained Outcome: Adequate for Discharge   Problem: Coping: Goal: Level of anxiety will decrease Outcome: Adequate for Discharge   Problem: Elimination: Goal: Will not experience complications related to bowel motility Outcome: Adequate for Discharge Goal: Will not experience complications related to urinary retention Outcome: Adequate for Discharge   Problem: Pain Managment: Goal: General experience of comfort will improve and/or be controlled Outcome: Adequate for Discharge   Problem: Safety: Goal: Ability to remain free from injury will improve Outcome: Adequate for Discharge   Problem: Skin Integrity: Goal: Risk for impaired skin integrity will decrease Outcome: Adequate for Discharge   Problem: Education: Goal: Ability to describe self-care measures that may prevent or decrease complications (Diabetes Survival Skills Education) will improve Outcome: Adequate for Discharge Goal: Individualized Educational  Video(s) Outcome: Adequate for Discharge   Problem: Coping: Goal: Ability to adjust to condition or change in health will improve Outcome: Adequate for Discharge   Problem: Fluid Volume: Goal: Ability to maintain a balanced intake and output will improve Outcome: Adequate for Discharge   Problem: Health Behavior/Discharge Planning: Goal: Ability to identify and utilize available resources and services will improve Outcome: Adequate for Discharge Goal: Ability to manage health-related needs will improve Outcome: Adequate for Discharge   Problem: Metabolic: Goal: Ability to maintain appropriate glucose levels will improve Outcome: Adequate for Discharge   Problem: Nutritional: Goal: Maintenance of adequate nutrition will improve Outcome: Adequate for Discharge Goal: Progress toward achieving an optimal weight will improve Outcome: Adequate for Discharge   Problem: Skin Integrity: Goal: Risk for impaired skin integrity will decrease Outcome: Adequate for Discharge   Problem: Tissue Perfusion: Goal: Adequacy of tissue perfusion will improve Outcome: Adequate for Discharge   Problem: Education: Goal: Understanding of discharge needs will improve Outcome: Adequate for Discharge Goal: Verbalization of understanding of the causes of altered bowel function will improve Outcome: Adequate for Discharge   Problem: Activity: Goal: Ability to tolerate increased activity will improve Outcome: Adequate for Discharge   Problem: Bowel/Gastric: Goal: Gastrointestinal status for postoperative course will improve Outcome: Adequate for Discharge   Problem: Health Behavior/Discharge Planning: Goal: Identification of community resources to assist with postoperative recovery needs will improve Outcome: Adequate for Discharge   Problem: Nutritional: Goal: Will attain and maintain optimal nutritional status will improve Outcome: Adequate for Discharge   Problem: Clinical  Measurements: Goal: Postoperative complications will be avoided or minimized Outcome: Adequate for Discharge   Problem: Respiratory: Goal: Respiratory status will improve Outcome: Adequate for Discharge   Problem: Skin Integrity: Goal: Will show signs of wound healing Outcome: Adequate for Discharge

## 2023-11-30 NOTE — Discharge Summary (Signed)
 Aaron Matthews, is a 63 y.o. male  DOB 01/13/1961  MRN [id number].  Admission date:  11/27/2023  Admitting Physician  Rendall Carwin, MD  Discharge Date:  11/30/2023   Primary MD  Bertell Satterfield, MD  Recommendations for primary care physician for things to follow:  1)Avoid ibuprofen/Advil/Aleve/Motrin/Goody Powders/Naproxen/BC powders/Meloxicam/Diclofenac/Indomethacin and other Nonsteroidal anti-inflammatory medications as these will make you more likely to bleed and can cause stomach ulcers, can also cause Kidney problems.   2)[place]  interventional radiology department from [facility]--- will call you on Monday, 12/03/2023 to confirm time and date for the liver biopsy procedure--- tentatively your Liver biopsy procedure may happen on Wednesday, 12/05/2023 but this may change. - You have to be fasting on the day of your liver biopsy procedure  4)Dr. Mickiel Kandala--oncology office will call you to reschedule your oncology appointment which need to take place after the pathology results from the liver biopsy is available  Admission Diagnosis  Acute blood loss anemia [D62] Acute GI bleeding [K92.2]   Discharge Diagnosis  Acute blood loss anemia [D62] Acute GI bleeding [K92.2]    Principal Problem:   Acute blood loss anemia Active Problems:   Morbid obesity (HCC)   Hyperglycemia   Hypertension   GERD (gastroesophageal reflux disease)   Diastolic dysfunction   Acute GI bleeding   Colon adenocarcinoma (HCC)   Metastases to the liver [facility])      Past Medical History:  Diagnosis Date   Bursitis    right shoulder   Diabetes mellitus    Diastolic dysfunction 01/18/2013   Grade 1   GERD (gastroesophageal reflux disease)    Heart murmur    as achild   Hypertension     Past Surgical History:  Procedure Laterality Date   CHOLECYSTECTOMY     COLONOSCOPY N/A 11/28/2023   Procedure: COLONOSCOPY;   Surgeon: Shaaron Lamar HERO, MD;  Location: [facility];  Service: Endoscopy;  Laterality: N/A;   dental extraction     complete   ESOPHAGOGASTRODUODENOSCOPY N/A 11/28/2023   Procedure: EGD (ESOPHAGOGASTRODUODENOSCOPY);  Surgeon: Shaaron Lamar HERO, MD;  Location: [facility];  Service: Endoscopy;  Laterality: N/A;   TONSILLECTOMY     TOTAL HIP ARTHROPLASTY  01/30/2012   Procedure: TOTAL HIP ARTHROPLASTY ANTERIOR APPROACH;  Surgeon: Donnice JONETTA Car, MD;  Location: [facility] ORS;  Service: Orthopedics;  Laterality: Left;     HPI  from the history and physical done on the day of admission:   Chief Complaint: Lightheadedness, dizziness and hemoglobin of 6   HPI: Aaron Matthews is a 63 y.o. male with medical history significant for hypertension, type 2 diabetes, morbid obesity, HFpEF, GERD, dyslipidemia, and ear infection with hearing loss being followed by ENT who presented to the ED after he was noted to have hemoglobin of 6 that resulted from lab work at PCP office yesterday.  He states that he has been having some ongoing dizzy spells as well as some lightheadedness for several months, but felt that much of this was attributed to his ear issues  for which she was following up with ENT in [place], [place].  He denies any dark stools or bloody stools and states that he only takes a baby aspirin  daily and no other blood thinners.  He is otherwise compliant with his home medications and denies any alcohol, NSAID, or tobacco use at this time.   ED Course: Vital signs with softer blood pressure readings and 2 unit PRBCs ordered.  Laboratory data with hemoglobin 5.7 and sodium 134.   Review of Systems: Reviewed as noted above, otherwise negative.   Hospital Course:     Brief Narrative:  63 y.o. male with medical history significant for hypertension, type 2 diabetes, morbid obesity, HFpEF, GERD, dyslipidemia, and ear infection with hearing loss being followed by ENT admitted on 11/27/2023 with symptomatic acute anemia  due to GI bleed and found to have ulcerated tumor in the cecum     -Assessment and Plan: 1)Invasive moderately differentiated adenocarcinoma of the Cecum--- colonoscopic findings on 11/28/2023 noted with Markedly redundant and elongated colon, Ulcerated tumor in the cecum status post biopsy,  -Hepatic flexure polyp - cold biopsy removed - Pathology consistent with Invasive moderately differentiated adenocarcinoma  -CT Chest and [facility] on 11/29/23 shows-- single ill-defined hypoattenuating 2.4 x 2.7 cm lesion in the right hepatic lobe, segment 5, which is incompletely characterized on the current exam.  MRI abdomen as per liver mass protocol requested -- There are multiple, sub 4 mm, solid, calcified and noncalcified nodules throughout bilateral lungs. These are indeterminate in this patient with known history of malignancy. Attention on follow-up examination is recommended. --IR consult for Liver Biopsy tentatively scheduled for 12/05/23 -General Surgery consult appreciated--- -GI input appreciated -Patient will need oncology follow-up after Liver Biopsy results are available - 2) acute symptomatic anemia---most likely due to #1 above --Upper Endoscopy on 11/28/2023 with duodenal and gastric polyps without evidence of ongoing bleeding -Colonoscopy on 11/28/2023 with cecal mass consistent with malignancy - Hgb up to 7.8 from 5.7 after transfusion of 2 units of PRBC   3)DM2--- Resume PTA  jardiance, metformin  and Mounjaro and Tresiba     4)HTN--- c/n Enalapril    5)GERD--- continue Protonix    6)Morbid Obesity- -Low calorie diet, portion control and increase physical activity discussed with patient -Body mass index is 42.7 kg/m.   7)Diabetic Neuropathy--continue Gabapentin    8)HLD--c/n Atorvastatin and Gemfibrozil for now   9)History of HFpEF/chronic diastolic dysfunction CHF - Prior 2D echocardiogram in 2014 with grade 1 diastolic dysfunction and preserved LVEF --c/n Enalapril  -No acute  exacerbation -   Disposition: The patient is from: Home              Anticipated d/c is to: Home  Discharge Condition: stable  Follow UP   Follow-up Information     Pappayliou, Catherine A, DO. Call.   Specialty: General Surgery Why: As needed Contact information: [address] [place] [facility] [address] [phone]                 Consults obtained - Gi/IR/Gen Surgery/Oncology  Diet and Activity recommendation:  As advised  Discharge Instructions    Discharge Instructions     Call MD for:  difficulty breathing, headache or visual disturbances   Complete by: As directed    Call MD for:  persistant dizziness or light-headedness   Complete by: As directed    Call MD for:  persistant nausea and vomiting   Complete by: As directed    Call MD for:  temperature >100.4   Complete by:  As directed    Diet - low sodium heart healthy   Complete by: As directed    Diet Carb Modified   Complete by: As directed    Discharge instructions   Complete by: As directed    1)Avoid ibuprofen/Advil/Aleve/Motrin/Goody Powders/Naproxen/BC powders/Meloxicam/Diclofenac/Indomethacin and other Nonsteroidal anti-inflammatory medications as these will make you more likely to bleed and can cause stomach ulcers, can also cause Kidney problems.   2)[place]  interventional radiology department from [facility]--- will call you on Monday, 12/03/2023 to confirm time and date for the liver biopsy procedure--- tentatively your Liver biopsy procedure may happen on Wednesday, 12/05/2023 but this may change. - You have to be fasting on the day of your liver biopsy procedure  4)Dr. Mickiel Kandala--oncology office will call you to reschedule your oncology appointment which need to take place after the pathology results from the liver biopsy is available   Increase activity slowly   Complete by: As directed        Discharge Medications     Allergies as of 11/30/2023       Reactions   Insulin   Glargine Rash   Penicillins Swelling, Rash        Medication List     STOP taking these medications    aspirin  EC 81 MG tablet   omeprazole 20 MG capsule Commonly known as: PRILOSEC       TAKE these medications    atorvastatin 20 MG tablet Commonly known as: LIPITOR Take 1 tablet by mouth daily.   diazepam 2 MG tablet Commonly known as: VALIUM TAKE 1 TABLET BY MOUTH THREE TIMES DAILY AS NEEDED FOR 15 DAYS   enalapril  10 MG tablet Commonly known as: VASOTEC  Take 1 tablet daily in the morning and take the second tablet in the afternoon/evening if your systolic blood pressure (top number) is 120 or above.   fexofenadine 180 MG tablet Commonly known as: ALLEGRA Take 180 mg by mouth daily.   FreeStyle Libre 2 Sensor Misc Apply topically.   gabapentin  300 MG capsule Commonly known as: NEURONTIN  Take 300 mg by mouth 4 (four) times daily.   gemfibrozil 600 MG tablet Commonly known as: LOPID Take 600 mg by mouth 2 (two) times daily.   HumaLOG KwikPen 100 UNIT/ML KwikPen Generic drug: insulin  lispro Inject 33 Units into the muscle 3 (three) times daily.   insulin  degludec 100 UNIT/ML FlexTouch Pen Commonly known as: TRESIBA Inject 50 Units into the skin 2 (two) times daily.   Jardiance 25 MG Tabs tablet Generic drug: empagliflozin Take 25 mg by mouth daily.   meclizine 25 MG tablet Commonly known as: ANTIVERT Take 25 mg by mouth daily as needed for dizziness.   metFORMIN  750 MG 24 hr tablet Commonly known as: GLUCOPHAGE -XR Take 750 mg by mouth daily with breakfast.   Mounjaro 2.5 MG/0.5ML Pen Generic drug: tirzepatide Inject 2.5 mg into the skin once a week.   pantoprazole  40 MG tablet Commonly known as: PROTONIX  Take 40 mg by mouth daily.   sildenafil 50 MG tablet Commonly known as: VIAGRA       Major procedures and Radiology Reports - PLEASE review detailed and final reports for all details, in brief -   MR LIVER W WO CONTRAST Result Date:  11/30/2023 CLINICAL DATA:  Hepatocellular carcinoma suspected Liver Cancer. EXAM: MRI ABDOMEN WITHOUT AND WITH CONTRAST TECHNIQUE: Multiplanar multisequence MR imaging of the abdomen was performed both before and after the administration of intravenous contrast. CONTRAST:  10mL GADAVIST  GADOBUTROL  1  MMOL/ML IV SOLN COMPARISON:  CT scan chest, abdomen and pelvis from 11/29/2023. FINDINGS: Lower chest: Unremarkable MR appearance to the lung bases. No pleural effusion. No pericardial effusion. Normal heart size. Hepatobiliary: The liver is mild-to-moderately enlarged in size. Noncirrhotic configuration. There are at least 5 mildly T2 hyperintense, target like (peripherally irregular rim enhancing) lesions throughout the liver (marked with electronic arrow sign on series 14), compatible with metastases. The dominant lesion is in the right hepatic lobe, 5 measuring 2.9 x 3.2 cm, which corresponds to the observations described on the CT scan from yesterday. The second largest lesion is in the left hepatic lobe, segment 2 near the capsule. These lesions show diffusion restriction on high B value images. The hepatic and portal veins are patent. No intrahepatic or extrahepatic bile duct dilatation. No choledocholithiasis. Status post cholecystectomy. Pancreas: No mass, inflammatory changes or other parenchymal abnormality identified. No main pancreatic duct dilation. Spleen: Mildly enlarged spleen measuring up to 8.9 x 15.7 cm orthogonally on coronal plane. No focal lesion. Adrenals/Urinary Tract: Unremarkable adrenal glands. No hydroureteronephrosis. There is a subcentimeter sized proteinaceous/hemorrhagic cyst in the left kidney interpolar region (series 7 and 14, image 68). There are several additional subcentimeter sized simple cortical cysts/sinus cysts in the left kidney. No suspicious renal lesion. Stomach/Bowel: Visualized portions within the abdomen are unremarkable. No disproportionate dilation of bowel loops.  Vascular/Lymphatic: No pathologically enlarged lymph nodes identified. No abdominal aortic aneurysm demonstrated. No ascites. Other: There are 2 areas of susceptibility artifacts in the region of pylorus/proximal duodenum, which corresponds to the metallic clips seen on the prior CT scan. There is another area of susceptibility artifact near the ileocecal junction region, which also favors a metallic clip. Musculoskeletal: There is a 1.4 x 2.1 cm hemangioma in the L3 vertebral body. Otherwise no suspicious lesion. Minimal degenerative changes of spine noted. IMPRESSION: 1. There are at least 5 hepatic lesions, compatible with metastases. The dominant lesion is in the right hepatic lobe, segment 5 measuring 2.9 x 3.2 cm. 2. Multiple other nonacute observations, as described above. Electronically Signed   By: Ree Molt M.D.   On: 11/30/2023 09:39   CT CHEST ABDOMEN PELVIS W CONTRAST Result Date: 11/29/2023 CLINICAL DATA:  Colon cancer, staging. * Tracking Code: BO * EXAM: CT CHEST, ABDOMEN, AND PELVIS WITH CONTRAST TECHNIQUE: Multidetector CT imaging of the chest, abdomen and pelvis was performed following the standard protocol during bolus administration of intravenous contrast. RADIATION DOSE REDUCTION: This exam was performed according to the departmental dose-optimization program which includes automated exposure control, adjustment of the mA and/or kV according to patient size and/or use of iterative reconstruction technique. CONTRAST:  100mL OMNIPAQUE  IOHEXOL  300 MG/ML  SOLN COMPARISON:  None Available. FINDINGS: CT CHEST FINDINGS Cardiovascular: Normal cardiac size. No pericardial effusion. No aortic aneurysm. There are coronary artery calcifications, in keeping with coronary artery disease. There are also mild peripheral atherosclerotic vascular calcifications of thoracic aorta and its major branches. Mediastinum/Nodes: Visualized thyroid gland appears grossly unremarkable. No solid / cystic mediastinal  masses. The esophagus is nondistended precluding optimal assessment. No axillary, mediastinal or hilar lymphadenopathy by size criteria. Lungs/Pleura: The central tracheo-bronchial tree is patent. There are patchy areas of linear, plate-like atelectasis and/or scarring throughout bilateral lungs. No mass or consolidation. No pleural effusion or pneumothorax. There multiple, sub 4 mm, solid, calcified and noncalcified nodules throughout bilateral lungs (marked with electronic arrow sign on series 101). These are indeterminate in this patient with known history of malignancy. Attention on follow-up examination  is recommended. Musculoskeletal: The visualized soft tissues of the chest wall are grossly unremarkable. No suspicious osseous lesions. There are mild multilevel degenerative changes in the visualized spine. CT ABDOMEN PELVIS FINDINGS Hepatobiliary: The liver is normal in size. Non-cirrhotic configuration. There is a single ill-defined hypoattenuating 2.4 x 2.7 cm lesion in the right hepatic lobe, segment 5, which is incompletely characterized on the current exam. Further evaluation with nonemergent MRI abdomen as per liver mass protocol is recommended. No intrahepatic or extrahepatic bile duct dilation. Gallbladder is surgically absent. Pancreas: Unremarkable. No pancreatic ductal dilatation or surrounding inflammatory changes. Spleen: Within normal limits. No focal lesion. Adrenals/Urinary Tract: Adrenal glands are unremarkable. No suspicious renal mass. No hydronephrosis. No renal or ureteric calculi. Unremarkable urinary bladder. Stomach/Bowel: No disproportionate dilation of the small or large bowel loops. There is irregular wall thickening of the cecum measuring up to 1.2-1.3 cm which is incompletely evaluated on the current exam. Please correlate with prior colonoscopy. Appendix is unremarkable. There are metallic linear structures in the proximal duodenum, likely from prior intervention. Correlate with  history. Vascular/Lymphatic: No ascites or pneumoperitoneum. There are 2 predominantly calcified structures in the right upper quadrant between the liver and duodenum, which are of unknown etiology but favored benign. These may be likely dropped gallstones from prior cholecystectomy. No abdominal or pelvic lymphadenopathy, by size criteria. No aneurysmal dilation of the major abdominal arteries. There are mild peripheral atherosclerotic vascular calcifications of the aorta and its major branches. Reproductive: Normal size prostate. Symmetric seminal vesicles. Other: There are small fat containing umbilical and left inguinal hernias. The soft tissues and abdominal wall are otherwise unremarkable. Musculoskeletal: No suspicious osseous lesions. There are mild multilevel degenerative changes in the visualized spine. Left hip arthroplasty noted. L3 vertebral body hemangioma noted. IMPRESSION: 1. There is a single ill-defined hypoattenuating 2.4 x 2.7 cm lesion in the right hepatic lobe, segment 5, which is incompletely characterized on the current exam. Further evaluation with nonemergent MRI abdomen as per liver mass protocol is recommended. 2. There are multiple, sub 4 mm, solid, calcified and noncalcified nodules throughout bilateral lungs. These are indeterminate in this patient with known history of malignancy. Attention on follow-up examination is recommended. 3. Otherwise no metastatic disease identified within the chest, abdomen or pelvis. 4. Multiple other nonacute observations, as described above. Aortic Atherosclerosis (ICD10-I70.0). Electronically Signed   By: Ree Molt M.D.   On: 11/29/2023 16:47   Micro Results  Recent Results (from the past 240 hours)  MRSA Next Gen by PCR, Nasal     Status: None   Collection Time: 11/27/23 12:27 PM   Specimen: Nasal Mucosa; Nasal Swab  Result Value Ref Range Status   MRSA by PCR Next Gen NOT DETECTED NOT DETECTED Final    Comment: (NOTE) The GeneXpert MRSA  Assay (FDA approved for NASAL specimens only), is one component of a comprehensive MRSA colonization surveillance program. It is not intended to diagnose MRSA infection nor to guide or monitor treatment for MRSA infections. Test performance is not FDA approved in patients less than 47 years old. Performed at [facility], [address], [place], [place] [address]     Today   Subjective    Aaron Matthews today has no new concerns  - Wife and daughter , as well as Brother are at bedside-- No fever  Or chills  Tolerating oral intake well  No Nausea or Vomiting  Had BM       Patient has been seen and examined prior  to discharge   Objective   Blood pressure 138/76, pulse 88, temperature 98.6 F (37 C), temperature source Oral, resp. rate 18, height 6' (1.829 m), weight (!) 142.8 kg, SpO2 96%.   Intake/Output Summary (Last 24 hours) at 11/30/2023 1506 Last data filed at 11/29/2023 1700 Gross per 24 hour  Intake 240 ml  Output --  Net 240 ml   Exam Gen:- Awake Alert, no acute distress  HEENT:- [place].AT, No sclera icterus Neck-Supple Neck,No JVD,.  Lungs-  CTAB , good air movement bilaterally CV- S1, S2 normal, regular Abd-  +ve B.Sounds, Abd Soft, No tenderness, increased Truncal adiposity   Extremity/Skin:- No  edema,   good pulses Psych-affect is appropriate, oriented x3 Neuro-no new focal deficits, no tremors    Data Review   CBC w Diff:  Lab Results  Component Value Date   WBC 6.4 11/30/2023   HGB 7.8 (L) 11/30/2023   HCT 28.9 (L) 11/30/2023   PLT 262 11/30/2023   LYMPHOPCT 16 11/27/2023   MONOPCT 9 11/27/2023   EOSPCT 4 11/27/2023   BASOPCT 1 11/27/2023   CMP:  Lab Results  Component Value Date   NA 137 11/30/2023   K 3.7 11/30/2023   CL 102 11/30/2023   CO2 25 11/30/2023   BUN 8 11/30/2023   CREATININE 0.71 11/30/2023   PROT 7.3 11/27/2023   ALBUMIN 4.1 11/27/2023   BILITOT 0.8 11/27/2023   ALKPHOS 58 11/27/2023   AST 19 11/27/2023   ALT 15  11/27/2023   Total Discharge time is about 33 minutes  Rendall Carwin M.D on 11/30/2023 at 3:06 PM  Go to [email or web address] -  for contact info  [facility] - Office  [phone]

## 2023-12-01 LAB — HEMOGLOBIN A1C
Hgb A1c MFr Bld: 6.8 % — ABNORMAL HIGH (ref 4.8–5.6)
Mean Plasma Glucose: 148 mg/dL

## 2023-12-03 ENCOUNTER — Other Ambulatory Visit: Payer: Self-pay | Admitting: Physician Assistant

## 2023-12-03 ENCOUNTER — Telehealth: Payer: Self-pay

## 2023-12-03 ENCOUNTER — Other Ambulatory Visit: Payer: Self-pay

## 2023-12-03 DIAGNOSIS — C787 Secondary malignant neoplasm of liver and intrahepatic bile duct: Secondary | ICD-10-CM

## 2023-12-03 DIAGNOSIS — C189 Malignant neoplasm of colon, unspecified: Secondary | ICD-10-CM

## 2023-12-03 NOTE — Transitions of Care (Post Inpatient/ED Visit) (Signed)
 12/03/2023  Name: Aaron Matthews. MRN: [id number] DOB: 08-Sep-1960  Today's TOC FU Call Status: Today's TOC FU Call Status:: Successful TOC FU Call Completed TOC FU Call Complete Date: 12/03/23 Patient's Name and Date of Birth confirmed.  Transition Care Management Follow-up Telephone Call Date of Discharge: 11/30/23 Dis[facility] Type of Discharge: Inpatient Admission Primary Inpatient Discharge Diagnosis:: Acute GI bleed How have you been since you were released from the hospital?: Better Any questions or concerns?: No  Items Reviewed: Did you receive and understand the discharge instructions provided?: Yes Medications obtained,verified, and reconciled?: Yes (Medications Reviewed) Any new allergies since your discharge?: No Dietary orders reviewed?: Yes Type of Diet Ordered:: Diabetic, Low Sodium Do you have support at home?: Yes People in Home [RPT]: spouse Name of Support/Comfort Primary Source: Davionne Dowty  Medications Reviewed Today: Medications Reviewed Today     Reviewed by Moises Reusing, RN (Case Manager) on 12/03/23 at 1016  Med List Status: <None>   Medication Order Taking? Sig Documenting Provider Last Dose Status Informant  atorvastatin (LIPITOR) 20 MG tablet [id number]  Take 1 tablet by mouth daily. [provider]  Active Self, Pharmacy Records, Spouse/Significant Other, Multiple Informants  Continuous Blood Gluc Sensor (FREESTYLE LIBRE 2 SENSOR) MISC [id number]  Apply topically. [provider]  Active Self, Pharmacy Records, Spouse/Significant Other, Multiple Informants  diazepam (VALIUM) 2 MG tablet [id number]  TAKE 1 TABLET BY MOUTH THREE TIMES DAILY AS NEEDED FOR 15 DAYS [provider]  Active Self, Pharmacy Records, Spouse/Significant Other, Multiple Informants           Med Note [facility], [facility]   Fri Nov 30, 2023  3:00 PM)    enalapril  (VASOTEC ) 10 MG tablet [id number]  Take 1 tablet daily in the  morning and take the second tablet in the afternoon/evening if your systolic blood pressure (top number) is 120 or above. Gasper Aquas, MD  Active Self, Pharmacy Records, Spouse/Significant Other, Multiple Informants  fexofenadine (ALLEGRA) 180 MG tablet [id number]  Take 180 mg by mouth daily. [provider]  Active Self, Pharmacy Records, Spouse/Significant Other, Multiple Informants  gabapentin  (NEURONTIN ) 300 MG capsule [id number]  Take 300 mg by mouth 4 (four) times daily. [provider]  Active Self, Pharmacy Records, Spouse/Significant Other, Multiple Informants  gemfibrozil (LOPID) 600 MG tablet [id number]  Take 600 mg by mouth 2 (two) times daily. [provider]  Active Self, Pharmacy Records, Spouse/Significant Other, Multiple Informants  HUMALOG KWIKPEN 100 UNIT/ML KiwkPen [id number]  Inject 33 Units into the muscle 3 (three) times daily. [provider]  Active Self, Pharmacy Records, Spouse/Significant Other, Multiple Informants           Med Note CARMIN ETHA HERO   Sun Apr 16, 2016  9:56 AM) Received from: External Pharmacy  insulin  degludec (TRESIBA) 100 UNIT/ML FlexTouch Pen [id number]  Inject 50 Units into the skin 2 (two) times daily. [provider]  Active Self, Pharmacy Records, Spouse/Significant Other, Multiple Informants  JARDIANCE 25 MG TABS tablet [id number]  Take 25 mg by mouth daily. [provider]  Active Self, Pharmacy Records, Spouse/Significant Other, Multiple Informants  meclizine (ANTIVERT) 25 MG tablet [id number]  Take 25 mg by mouth daily as needed for dizziness. [provider]  Active Self, Pharmacy Records, Spouse/Significant Other, Multiple Informants  metFORMIN  (GLUCOPHAGE -XR) 750 MG 24 hr tablet [id number]  Take 750 mg by mouth daily with breakfast. [provider]  Active Self, Pharmacy Records, Spouse/Significant Other, Multiple Informants  MOUNJARO 2.5 MG/0.5ML Pen [id number]  Inject 2.5 mg into  the skin once a week. [provider]  Active Self, Pharmacy Records, Spouse/Significant Other, Multiple Informants  pantoprazole  (PROTONIX ) 40 MG tablet [id number]  Take 40 mg by mouth daily. [provider]  Active Self, Pharmacy Records, Spouse/Significant Other, Multiple Informants  sildenafil (VIAGRA) 50 MG tablet [id number]   [provider]  Active Self, Pharmacy Records, Spouse/Significant Other, Multiple Informants            Home Care and Equipment/Supplies: Were Home Health Services Ordered?: NA Any new equipment or medical supplies ordered?: NA  Functional Questionnaire: Do you need assistance with bathing/showering or dressing?: No Do you need assistance with meal preparation?: No Do you need assistance with eating?: No Do you have difficulty maintaining continence: No Do you need assistance with getting out of bed/getting out of a chair/moving?: No Do you have difficulty managing or taking your medications?: No  Follow up appointments reviewed: PCP Follow-up appointment confirmed?: No (Dr. Bertell with [facility] is on vacatio this week. The patient states he has an appointment 12/03/23) MD Provider Line Number:[phone] Given: No Specialist Hospital Follow-up appointment confirmed?: Yes Date of Specialist follow-up appointment?: 12/12/23 Follow-Up Specialty Provider:: Mickiel Dry Do you need transportation to your follow-up appointment?: No Do you understand care options if your condition(s) worsen?: Yes-patient verbalized understanding  SDOH Interventions Today    Flowsheet Row Most Recent Value  SDOH Interventions   Food Insecurity Interventions Intervention Not Indicated  Housing Interventions Intervention Not Indicated  Transportation Interventions Intervention Not Indicated  Utilities Interventions Intervention Not Indicated    Medford Balboa, BSN, RN [place]  VBCI - Population Health RN Care  Manager [phone]

## 2023-12-04 ENCOUNTER — Other Ambulatory Visit: Payer: Self-pay | Admitting: Radiology

## 2023-12-04 DIAGNOSIS — C787 Secondary malignant neoplasm of liver and intrahepatic bile duct: Secondary | ICD-10-CM

## 2023-12-04 NOTE — Progress Notes (Signed)
 Patient for US  guided Liver biopsy on Wed 12/05/23, I called and spoke with the patient on the phone and gave pre-procedure instructions. Pt was made aware to be here at 10a, NPO after MN prior to procedure as well as driver post procedure/recovery/discharge. Pt stated understanding.  Called 12/04/23

## 2023-12-05 ENCOUNTER — Other Ambulatory Visit: Payer: Self-pay

## 2023-12-05 ENCOUNTER — Ambulatory Visit
Admission: RE | Admit: 2023-12-05 | Discharge: 2023-12-05 | Disposition: A | Discharge status: Home / Self Care | Source: Ambulatory Visit | Attending: Physician Assistant | Admitting: Physician Assistant

## 2023-12-05 ENCOUNTER — Inpatient Hospital Stay: Admitting: Oncology

## 2023-12-05 ENCOUNTER — Inpatient Hospital Stay

## 2023-12-05 DIAGNOSIS — C787 Secondary malignant neoplasm of liver and intrahepatic bile duct: Secondary | ICD-10-CM | POA: Insufficient documentation

## 2023-12-05 LAB — CBC
HCT: 30.2 % — ABNORMAL LOW (ref 39.0–52.0)
Hemoglobin: 8.2 g/dL — ABNORMAL LOW (ref 13.0–17.0)
MCH: 17.7 pg — ABNORMAL LOW (ref 26.0–34.0)
MCHC: 27.2 g/dL — ABNORMAL LOW (ref 30.0–36.0)
MCV: 65.4 fL — ABNORMAL LOW (ref 80.0–100.0)
Platelets: 269 K/uL (ref 150–400)
RBC: 4.62 MIL/uL (ref 4.22–5.81)
RDW: 27.5 % — ABNORMAL HIGH (ref 11.5–15.5)
WBC: 4.9 K/uL (ref 4.0–10.5)
nRBC: 0 % (ref 0.0–0.2)

## 2023-12-05 LAB — GLUCOSE, CAPILLARY: Glucose-Capillary: 142 mg/dL — ABNORMAL HIGH (ref 70–99)

## 2023-12-05 LAB — PROTIME-INR
INR: 1 (ref 0.8–1.2)
Prothrombin Time: 13.2 s (ref 11.4–15.2)

## 2023-12-05 MED ORDER — SODIUM CHLORIDE 0.9 % IV SOLN: INTRAVENOUS | Status: DC

## 2023-12-05 NOTE — Progress Notes (Signed)
 Patient's procedure was cancelled due to patient took aspirin  12/04/23 in the evening

## 2023-12-05 NOTE — Progress Notes (Signed)
 Patient arrived today prepared for image guided liver lesion biopsy with Dr. Karalee. Interview with patient revealed that he is taking ASA not listed in previous medication reconciliation. Procedure to be canceled today. He will begin ASA hold and plan to return next week. Shared need for reschedule with Clarita Ricker with scheduling, preferably Tues per patient for liver lesion biopsy. He will be scheduled according to available openings.

## 2023-12-10 ENCOUNTER — Other Ambulatory Visit: Payer: Self-pay | Admitting: Interventional Radiology

## 2023-12-10 DIAGNOSIS — Z01818 Encounter for other preprocedural examination: Secondary | ICD-10-CM

## 2023-12-10 NOTE — Progress Notes (Signed)
 Patient for US  guided Liver Biopsy on Tues 12/11/23, I called and spoke with the patient on the phone and gave pre-procedure instructions. Pt was made aware to be here at 10a, last dose of ASA 81mg  was Wed 12/05/23, NPO after MN prior to procedure as well as driver post procedure/recovery/discharge. Pt stated understanding.  Called 12/07/23

## 2023-12-10 NOTE — H&P (Signed)
 Chief Complaint: Patient was seen in consultation today for colon cancer with liver lesion.   Referring Physician(s): Neita Kirsch A  Supervising Physician: Jenna Hacker  Patient Status: [facility] - Out-pt  History of Present Illness: Aaron Matthews. is a 63 y.o. male with a medical history significant for morbid obesity, HTN, DM and recently diagnosed colon cancer with suspected metastases. He presented to the ED 11/27/23 after outpatient labs showed a hemoglobin of 6. The patient had been complaining of feeling lightheaded/dizzy for several months. His work up ultimately revealed adenocarcinoma of the cecum as well as signs of hepatic metastases. The patient was discharged from the hospital 11/30/23 with plans for an outpatient liver lesion biopsy.   Interventional Radiology has been asked to evaluate this patient for an image-guided liver lesion biopsy. Imaging reviewed and procedure approved by Dr. Vanice. The patient is scheduled to meet with Oncology next week.   Past Medical History:  Diagnosis Date   Bursitis    right shoulder   Diabetes mellitus    Diastolic dysfunction 01/18/2013   Grade 1   GERD (gastroesophageal reflux disease)    Heart murmur    as achild   Hypertension     Past Surgical History:  Procedure Laterality Date   CHOLECYSTECTOMY     COLONOSCOPY N/A 11/28/2023   Procedure: COLONOSCOPY;  Surgeon: Shaaron Lamar HERO, MD;  Location: [facility] ENDO SUITE;  Service: Endoscopy;  Laterality: N/A;   dental extraction     complete   ESOPHAGOGASTRODUODENOSCOPY N/A 11/28/2023   Procedure: EGD (ESOPHAGOGASTRODUODENOSCOPY);  Surgeon: Shaaron Lamar HERO, MD;  Location: [facility];  Service: Endoscopy;  Laterality: N/A;   TONSILLECTOMY     TOTAL HIP ARTHROPLASTY  01/30/2012   Procedure: TOTAL HIP ARTHROPLASTY ANTERIOR APPROACH;  Surgeon: Donnice JONETTA Car, MD;  Location: [facility] ORS;  Service: Orthopedics;  Laterality: Left;    Allergies: Insulin  glargine and  Penicillins  Medications: Prior to Admission medications   Medication Sig Start Date End Date Taking? Authorizing Provider  aspirin  EC 81 MG tablet Take 81 mg by mouth daily. Swallow whole.    [provider]  atorvastatin (LIPITOR) 20 MG tablet Take 1 tablet by mouth daily. 07/12/20   [provider]  Continuous Blood Gluc Sensor (FREESTYLE LIBRE 2 SENSOR) MISC Apply topically. 09/09/20   [provider]  diazepam (VALIUM) 2 MG tablet TAKE 1 TABLET BY MOUTH THREE TIMES DAILY AS NEEDED FOR 15 DAYS 11/21/23   [provider]  enalapril  (VASOTEC ) 10 MG tablet Take 1 tablet daily in the morning and take the second tablet in the afternoon/evening if your systolic blood pressure (top number) is 120 or above. 01/18/13   Gasper Aquas, MD  fexofenadine (ALLEGRA) 180 MG tablet Take 180 mg by mouth daily.    [provider]  gabapentin  (NEURONTIN ) 300 MG capsule Take 300 mg by mouth 4 (four) times daily.    [provider]  gemfibrozil (LOPID) 600 MG tablet Take 600 mg by mouth 2 (two) times daily. 08/25/20   [provider]  HUMALOG KWIKPEN 100 UNIT/ML KiwkPen Inject 33 Units into the muscle 3 (three) times daily. 04/07/16   [provider]  insulin  degludec (TRESIBA) 100 UNIT/ML FlexTouch Pen Inject 50 Units into the skin 2 (two) times daily.    [provider]  JARDIANCE 25 MG TABS tablet Take 25 mg by mouth daily. 09/07/20   [provider]  meclizine (ANTIVERT) 25 MG tablet Take 25 mg by mouth daily  as needed for dizziness. Patient not taking: Reported on 12/05/2023 09/24/23   [provider]  metFORMIN  (GLUCOPHAGE -XR) 750 MG 24 hr tablet Take 750 mg by mouth daily with breakfast.    [provider]  MOUNJARO 2.5 MG/0.5ML Pen Inject 2.5 mg into the skin once a week.    [provider]  pantoprazole  (PROTONIX ) 40 MG tablet Take 40 mg by mouth daily.    [provider]  sildenafil  (VIAGRA) 50 MG tablet     [provider]     History reviewed. No pertinent family history.  Social History   Socioeconomic History   Marital status: Married    Spouse name: Not on file   Number of children: Not on file   Years of education: Not on file   Highest education level: Not on file  Occupational History   Not on file  Tobacco Use   Smoking status: Former    Current packs/day: 0.00    Average packs/day: 1 pack/day for 10.0 years (10.0 ttl pk-yrs)    Types: Cigarettes    Start date: 01/25/1983    Quit date: 01/24/1993    Years since quitting: 30.8   Smokeless tobacco: Never  Vaping Use   Vaping status: Never Used  Substance and Sexual Activity   Alcohol use: No   Drug use: No   Sexual activity: Not on file  Other Topics Concern   Not on file  Social History Narrative   Not on file   Social Drivers of Health   Financial Resource Strain: Not on file  Food Insecurity: No Food Insecurity (12/03/2023)   Hunger Vital Sign    Worried About Running Out of Food in the Last Year: Never true    Ran Out of Food in the Last Year: Never true  Transportation Needs: No Transportation Needs (12/03/2023)   PRAPARE - Administrator, Civil Service (Medical): No    Lack of Transportation (Non-Medical): No  Physical Activity: Not on file  Stress: Not on file  Social Connections: Not on file    Review of Systems: A 12 point ROS discussed and pertinent positives are indicated in the HPI above.  All other systems are negative.  Review of Systems  All other systems reviewed and are negative.   Vital Signs: BP (!) 141/74   Pulse 96   Temp 98.2 F (36.8 C) (Oral)   Resp 12   Ht 6' (1.829 m)   Wt (!) 317 lb (143.8 kg)   SpO2 95%   BMI 42.99 kg/m   Physical Exam Constitutional:      General: He is not in acute distress.    Appearance: He is obese. He is not ill-appearing.  HENT:     Mouth/Throat:     Mouth: Mucous membranes are moist.      Pharynx: Oropharynx is clear.  Cardiovascular:     Rate and Rhythm: Normal rate.  Abdominal:     Tenderness: There is no abdominal tenderness.  Musculoskeletal:     Right lower leg: No edema.     Left lower leg: No edema.  Skin:    General: Skin is warm and dry.  Neurological:     Mental Status: He is alert and oriented to person, place, and time.      Labs:  CBC: Recent Labs    11/28/23 0438 11/28/23 1415 11/29/23 0442 11/30/23 0505 12/05/23 1022 12/11/23 1017  WBC 6.9  --   --  6.4  4.9 6.1  HGB 6.9*   < > 7.3* 7.8* 8.2* 7.9*  HCT 26.3*   < > 27.9* 28.9* 30.2* 29.2*  PLT 262  --   --  262 269 397   < > = values in this interval not displayed.    COAGS: Recent Labs    11/27/23 1016 12/05/23 1022  INR 0.9 1.0  APTT 27  --     BMP: Recent Labs    11/27/23 1016 11/28/23 0438 11/30/23 0505  NA 134* 137 137  K 4.2 3.9 3.7  CL 100 101 102  CO2 21* 24 25  GLUCOSE 282* 140* 198*  BUN 20 15 8   CALCIUM 9.2 9.1 9.1  CREATININE 0.81 0.74 0.71  GFRNONAA >60 >60 >60    LIVER FUNCTION TESTS: Recent Labs    11/27/23 1016  BILITOT 0.8  AST 19  ALT 15  ALKPHOS 58  PROT 7.3  ALBUMIN 4.1    TUMOR MARKERS: No results for input(s): AFPTM, CEA, CA199, CHROMGRNA in the last 8760 hours.  Assessment and Plan:  Colon cancer with liver lesions: Aaron JUDITHANN Duwaine Mickey., 63 year old male, presents today to the [facility] Interventional Radiology department for an image-guided liver lesion biopsy. The patient is aware his oncologist will discuss his biopsy results with him.   Risks and benefits of this procedure were discussed with the patient and/or patient's family including, but not limited to bleeding, infection, damage to adjacent structures or low yield requiring additional tests.  All of the questions were answered and there is agreement to proceed. He has been NPO. His last dose of Aspirin  was last Tuesday 12/04/23.   Consent  signed and in chart.  Thank you for this interesting consult.  I greatly enjoyed meeting Aaron Matthews. and look forward to participating in their care.  A copy of this report was sent to the requesting provider on this date.  Electronically Signed: Warren Dais, AGACNP-BC 12/11/2023, 11:10 AM   I spent a total of  30 Minutes   in face to face in clinical consultation, greater than 50% of which was counseling/coordinating care for colon cancer with liver lesions.

## 2023-12-11 ENCOUNTER — Ambulatory Visit
Admission: RE | Admit: 2023-12-11 | Discharge: 2023-12-11 | Disposition: A | Discharge status: Home / Self Care | Source: Ambulatory Visit | Attending: Physician Assistant | Admitting: Physician Assistant

## 2023-12-11 ENCOUNTER — Other Ambulatory Visit: Payer: Self-pay

## 2023-12-11 DIAGNOSIS — Z794 Long term (current) use of insulin: Secondary | ICD-10-CM | POA: Insufficient documentation

## 2023-12-11 DIAGNOSIS — C787 Secondary malignant neoplasm of liver and intrahepatic bile duct: Secondary | ICD-10-CM | POA: Insufficient documentation

## 2023-12-11 DIAGNOSIS — Z7984 Long term (current) use of oral hypoglycemic drugs: Secondary | ICD-10-CM | POA: Insufficient documentation

## 2023-12-11 DIAGNOSIS — Z01818 Encounter for other preprocedural examination: Secondary | ICD-10-CM

## 2023-12-11 DIAGNOSIS — C19 Malignant neoplasm of rectosigmoid junction: Secondary | ICD-10-CM | POA: Diagnosis not present

## 2023-12-11 DIAGNOSIS — I10 Essential (primary) hypertension: Secondary | ICD-10-CM | POA: Diagnosis not present

## 2023-12-11 DIAGNOSIS — C180 Malignant neoplasm of cecum: Secondary | ICD-10-CM | POA: Insufficient documentation

## 2023-12-11 DIAGNOSIS — E119 Type 2 diabetes mellitus without complications: Secondary | ICD-10-CM | POA: Insufficient documentation

## 2023-12-11 DIAGNOSIS — Z87891 Personal history of nicotine dependence: Secondary | ICD-10-CM | POA: Insufficient documentation

## 2023-12-11 LAB — PROTIME-INR
INR: 1 (ref 0.8–1.2)
Prothrombin Time: 13.8 s (ref 11.4–15.2)

## 2023-12-11 LAB — CBC
HCT: 29.2 % — ABNORMAL LOW (ref 39.0–52.0)
Hemoglobin: 7.9 g/dL — ABNORMAL LOW (ref 13.0–17.0)
MCH: 17.4 pg — ABNORMAL LOW (ref 26.0–34.0)
MCHC: 27.1 g/dL — ABNORMAL LOW (ref 30.0–36.0)
MCV: 64.5 fL — ABNORMAL LOW (ref 80.0–100.0)
Platelets: 397 K/uL (ref 150–400)
RBC: 4.53 MIL/uL (ref 4.22–5.81)
RDW: 27.3 % — ABNORMAL HIGH (ref 11.5–15.5)
WBC: 6.1 K/uL (ref 4.0–10.5)
nRBC: 0 % (ref 0.0–0.2)

## 2023-12-11 LAB — GLUCOSE, CAPILLARY: Glucose-Capillary: 168 mg/dL — ABNORMAL HIGH (ref 70–99)

## 2023-12-11 MED ORDER — LIDOCAINE 1 % OPTIME INJ - NO CHARGE
10.0000 mL | Freq: Once | INTRAMUSCULAR | Status: DC
  Filled 2023-12-11: qty 10

## 2023-12-11 MED ORDER — SODIUM CHLORIDE 0.9 % IV SOLN: INTRAVENOUS | Status: DC

## 2023-12-11 MED ORDER — FENTANYL CITRATE (PF) 100 MCG/2ML IJ SOLN
INTRAMUSCULAR | Status: AC | PRN
  Administered 2023-12-11: 25 ug via INTRAVENOUS
  Administered 2023-12-11: 50 ug via INTRAVENOUS
  Administered 2023-12-11: 25 ug via INTRAVENOUS

## 2023-12-11 MED ORDER — MIDAZOLAM HCL 5 MG/5ML IJ SOLN
INTRAMUSCULAR | Status: AC | PRN
  Administered 2023-12-11: .5 mg via INTRAVENOUS

## 2023-12-11 MED ORDER — MIDAZOLAM HCL 2 MG/2ML IJ SOLN
INTRAMUSCULAR | Status: AC
  Filled 2023-12-11: qty 4

## 2023-12-11 MED ORDER — FENTANYL CITRATE (PF) 100 MCG/2ML IJ SOLN
INTRAMUSCULAR | Status: AC
  Filled 2023-12-11: qty 4

## 2023-12-11 MED ORDER — MIDAZOLAM HCL 2 MG/2ML IJ SOLN
INTRAMUSCULAR | Status: AC | PRN
  Administered 2023-12-11: 1 mg via INTRAVENOUS
  Administered 2023-12-11: .5 mg via INTRAVENOUS

## 2023-12-11 NOTE — Procedures (Signed)
 Interventional Radiology Procedure Note  Procedure: U/S Guided Biopsy of liver mass  Complications: None  Estimated Blood Loss: < 10 mL  Findings: 18 G core biopsy of right lobe liver mass performed under U/S guidance.  2 core samples obtained and sent to Pathology.  Cordella DELENA Banner, MD

## 2023-12-12 ENCOUNTER — Ambulatory Visit

## 2023-12-12 ENCOUNTER — Ambulatory Visit: Admitting: Oncology

## 2023-12-12 ENCOUNTER — Other Ambulatory Visit

## 2023-12-12 LAB — SURGICAL PATHOLOGY

## 2023-12-17 NOTE — Telephone Encounter (Signed)
 Rourk, Lamar HERO, MD  Jeanell Graeme RAMAN, CMA Take patient off 1 year follow-up colonoscopy from colon cancer diagnosis for the time being

## 2023-12-18 NOTE — Progress Notes (Incomplete)
 Hematology-Oncology Clinic Note  Bertell Satterfield, MD   Reason for Referral: Colon adenocarcinoma with liver metastasis  Oncology History: I have reviewed his chart and materials related to his cancer extensively and collaborated history with the patient. Summary of oncologic history is as follows:  Diagnosis: Metastatic colon adenocarcinoma  -11/28/2023: Cecal mass biopsy: Invasive moderately differentiated adenocarcinoma. -11/28/2023: Colonoscopy: Ulcerated tumor in the cecum.  Markedly redundant and elongated colon. -11/29/2023: CT RJE:Uyzmz is a single ill-defined hypoattenuating 2.4 x 2.7 cm lesion in the right hepatic lobe, segment 5, which is incompletely characterized on the current exam. Further evaluation with nonemergent MRI abdomen as per liver mass protocol is recommended. There are multiple, sub 4 mm, solid, calcified and noncalcified nodules throughout bilateral lungs. Otherwise no metastatic disease identified within the chest, abdomen or pelvis. -11/30/2023: MRI liver:There are at least 5 hepatic lesions, compatible with metastases. The dominant lesion is in the right hepatic lobe, segment 5 measuring 2.9 x 3.2 cm. -12/11/2023: Liver, biopsy, right lobe, mass :       Metastatic adenocarcinoma with necrosis, consistent with colorectal primary    History of Presenting Illness: Aaron Matthews. 63 y.o. male is referred by Dr.Courage for metastatic colon adenocarcinoma.  Patient was accompanied by his wife, daughter and a very close friend.  Patient was having symptoms of dizziness for 3 to 4 months now.  He was recently sent to the ER for low hemoglobin.  At this time he was also positive for FOBT and then had colonoscopy done which showed cecal mass.  He then had a CT abdomen and pelvis showing a liver mass confirmed by MRI.  He had a biopsy of the liver lesion consistent with colon adenocarcinoma.  MMR testing is pending at this time.  Patient reports feeling  fatigued and increased shortness of breath.He experiences difficulty walking long distances without becoming short of breath. He has been less active recently, spending significant time in a recliner due to dizziness and fear of falling, especially given his history of hip replacement. Prior to these symptoms, he was active, playing golf and working as a Transport planner.  He has no other complaints today.  No significant weight loss or appetite changes. No visible bleeding except for a minor episode two months ago after starting gabapentin , which caused straining during bowel movements.  His social history includes quitting smoking 30 years ago and abstaining from alcohol. He lives in [place], which is centrally located to several nearby towns. His family history is notable for his father having had pancreatic cancer and testicular cancer in the 60s.   Medical History: Past Medical History:  Diagnosis Date   Bursitis    right shoulder   Diabetes mellitus    Diastolic dysfunction 01/18/2013   Grade 1   GERD (gastroesophageal reflux disease)    Heart murmur    as achild   Hypertension     Surgical history: Past Surgical History:  Procedure Laterality Date   CHOLECYSTECTOMY     COLONOSCOPY N/A 11/28/2023   Procedure: COLONOSCOPY;  Surgeon: Shaaron Lamar HERO, MD;  Location: [facility];  Service: Endoscopy;  Laterality: N/A;   dental extraction     complete   ESOPHAGOGASTRODUODENOSCOPY N/A 11/28/2023   Procedure: EGD (ESOPHAGOGASTRODUODENOSCOPY);  Surgeon: Shaaron Lamar HERO, MD;  Location: [facility];  Service: Endoscopy;  Laterality: N/A;   TONSILLECTOMY     TOTAL HIP ARTHROPLASTY  01/30/2012   Procedure: TOTAL HIP ARTHROPLASTY ANTERIOR APPROACH;  Surgeon: Donnice JONETTA Car,  MD;  Location: [facility];  Service: Orthopedics;  Laterality: Left;     Allergies:  is allergic to insulin  glargine and penicillins.  Medications:  Current Outpatient Medications  Medication Sig Dispense Refill    aspirin  EC 81 MG tablet Take 81 mg by mouth daily. Swallow whole.     atorvastatin (LIPITOR) 20 MG tablet Take 1 tablet by mouth daily.     Continuous Blood Gluc Sensor (FREESTYLE LIBRE 2 SENSOR) MISC Apply topically.     diazepam (VALIUM) 2 MG tablet TAKE 1 TABLET BY MOUTH THREE TIMES DAILY AS NEEDED FOR 15 DAYS     enalapril  (VASOTEC ) 10 MG tablet Take 1 tablet daily in the morning and take the second tablet in the afternoon/evening if your systolic blood pressure (top number) is 120 or above.     fexofenadine (ALLEGRA) 180 MG tablet Take 180 mg by mouth daily.     furosemide (LASIX) 40 MG tablet Take 40 mg by mouth daily.     gabapentin  (NEURONTIN ) 300 MG capsule Take 300 mg by mouth 4 (four) times daily.     gemfibrozil (LOPID) 600 MG tablet Take 600 mg by mouth 2 (two) times daily.     HUMALOG KWIKPEN 100 UNIT/ML KiwkPen Inject 33 Units into the muscle 3 (three) times daily.     insulin  degludec (TRESIBA) 100 UNIT/ML FlexTouch Pen Inject 50 Units into the skin 2 (two) times daily.     JARDIANCE 25 MG TABS tablet Take 25 mg by mouth daily.     meclizine (ANTIVERT) 25 MG tablet Take 25 mg by mouth daily as needed for dizziness.     metFORMIN  (GLUCOPHAGE -XR) 750 MG 24 hr tablet Take 750 mg by mouth daily with breakfast.     MOUNJARO 2.5 MG/0.5ML Pen Inject 2.5 mg into the skin once a week.     pantoprazole  (PROTONIX ) 40 MG tablet Take 40 mg by mouth daily.     sildenafil (VIAGRA) 50 MG tablet      No current facility-administered medications for this visit.    Review of Systems: Constitutional: Denies fevers, chills or abnormal night sweats Eyes: Denies blurriness of vision, double vision or watery eyes Ears, nose, mouth, throat, and face: Denies mucositis or sore throat Respiratory: Denies cough, dyspnea or wheezes Cardiovascular: Denies palpitation, chest discomfort or lower extremity swelling Gastrointestinal:  Denies nausea, heartburn or change in bowel habits Skin: Denies abnormal  skin rashes Lymphatics: Denies new lymphadenopathy or easy bruising Neurological:Denies numbness, tingling or new weaknesses Behavioral/Psych: Mood is stable, no new changes  All other systems were reviewed with the patient and are negative.  Physical Examination: ECOG PERFORMANCE STATUS: 1 - Symptomatic but completely ambulatory  Vitals:   12/19/23 1114  BP: (!) 146/69  Pulse: (!) 116  Resp: 20  Temp: 98.1 F (36.7 C)  SpO2: 97%   Filed Weights   12/19/23 1114  Weight: (!) 313 lb 11.4 oz (142.3 kg)    GENERAL:alert, no distress and comfortable SKIN: skin color, texture, turgor are normal, no rashes or significant lesions LYMPH:  no palpable lymphadenopathy in the cervical, axillary or inguinal LUNGS: clear to auscultation and percussion with normal breathing effort HEART: regular rate & rhythm and no murmurs and no lower extremity edema ABDOMEN:abdomen soft, non-tender and normal bowel sounds Musculoskeletal:no cyanosis of digits and no clubbing  PSYCH: alert & oriented x 3 with fluent speech NEURO: no focal motor/sensory deficits   Laboratory Data: I have reviewed the data as listed Lab Results  Component Value Date   WBC 7.3 12/19/2023   HGB 8.9 (L) 12/19/2023   HCT 32.6 (L) 12/19/2023   MCV 65.6 (L) 12/19/2023   PLT 454 (H) 12/19/2023   Recent Labs    11/27/23 1016 11/28/23 0438 11/30/23 0505 12/19/23 1202  NA 134* 137 137 136  K 4.2 3.9 3.7 3.9  CL 100 101 102 99  CO2 21* 24 25 23   GLUCOSE 282* 140* 198* 237*  BUN 20 15 8 19   CREATININE 0.81 0.74 0.71 0.87  CALCIUM 9.2 9.1 9.1 9.7  GFRNONAA >60 >60 >60 >60  PROT 7.3  --   --  8.2*  ALBUMIN 4.1  --   --  4.8  AST 19  --   --  20  ALT 15  --   --  19  ALKPHOS 58  --   --  74  BILITOT 0.8  --   --  0.7    Latest Reference Range & Units 11/27/23 10:16  Iron 45 - 182 ug/dL 11 (L)  UIBC ug/dL 400  TIBC 749 - 549 ug/dL 389 (H)  Saturation Ratios 17.9 - 39.5 % 2 (L)  Ferritin 24 - 336 ng/mL <3 (L)   Folate >5.9 ng/mL 10.9  (L): Data is abnormally low (H): Data is abnormally high   Latest Reference Range & Units 11/29/23 15:50  CEA 0.0 - 4.7 ng/mL 60.8 (H)  (H): Data is abnormally high  Radiographic Studies: I have personally reviewed the radiological images as listed and agreed with the findings in the report.  CLINICAL DATA:  Hepatocellular carcinoma suspected Liver Cancer.   EXAM: MRI ABDOMEN WITHOUT AND WITH CONTRAST   TECHNIQUE: Multiplanar multisequence MR imaging of the abdomen was performed both before and after the administration of intravenous contrast.   CONTRAST:  10mL GADAVIST  GADOBUTROL  1 MMOL/ML IV SOLN   COMPARISON:  CT scan chest, abdomen and pelvis from 11/29/2023.   FINDINGS: Lower chest: Unremarkable MR appearance to the lung bases. No pleural effusion. No pericardial effusion. Normal heart size.   Hepatobiliary: The liver is mild-to-moderately enlarged in size. Noncirrhotic configuration. There are at least 5 mildly T2 hyperintense, target like (peripherally irregular rim enhancing) lesions throughout the liver (marked with electronic arrow sign on series 14), compatible with metastases. The dominant lesion is in the right hepatic lobe, 5 measuring 2.9 x 3.2 cm, which corresponds to the observations described on the CT scan from yesterday. The second largest lesion is in the left hepatic lobe, segment 2 near the capsule. These lesions show diffusion restriction on high B value images.   The hepatic and portal veins are patent.   No intrahepatic or extrahepatic bile duct dilatation. No choledocholithiasis. Status post cholecystectomy.   Pancreas: No mass, inflammatory changes or other parenchymal abnormality identified. No main pancreatic duct dilation.   Spleen: Mildly enlarged spleen measuring up to 8.9 x 15.7 cm orthogonally on coronal plane. No focal lesion.   Adrenals/Urinary Tract: Unremarkable adrenal glands.  No hydroureteronephrosis. There is a subcentimeter sized proteinaceous/hemorrhagic cyst in the left kidney interpolar region (series 7 and 14, image 68). There are several additional subcentimeter sized simple cortical cysts/sinus cysts in the left kidney. No suspicious renal lesion.   Stomach/Bowel: Visualized portions within the abdomen are unremarkable. No disproportionate dilation of bowel loops.   Vascular/Lymphatic: No pathologically enlarged lymph nodes identified. No abdominal aortic aneurysm demonstrated. No ascites.   Other: There are 2 areas of susceptibility artifacts in the region of pylorus/proximal duodenum, which  corresponds to the metallic clips seen on the prior CT scan. There is another area of susceptibility artifact near the ileocecal junction region, which also favors a metallic clip.   Musculoskeletal: There is a 1.4 x 2.1 cm hemangioma in the L3 vertebral body. Otherwise no suspicious lesion. Minimal degenerative changes of spine noted.   IMPRESSION: 1. There are at least 5 hepatic lesions, compatible with metastases. The dominant lesion is in the right hepatic lobe, segment 5 measuring 2.9 x 3.2 cm. 2. Multiple other nonacute observations, as described above.     Electronically Signed   By: Ree Molt M.D.   On: 11/30/2023 09:39  CLINICAL DATA:  Colon cancer, staging. * Tracking Code: BO *   EXAM: CT CHEST, ABDOMEN, AND PELVIS WITH CONTRAST   TECHNIQUE: Multidetector CT imaging of the chest, abdomen and pelvis was performed following the standard protocol during bolus administration of intravenous contrast.   RADIATION DOSE REDUCTION: This exam was performed according to the departmental dose-optimization program which includes automated exposure control, adjustment of the mA and/or kV according to patient size and/or use of iterative reconstruction technique.   CONTRAST:  OMNIPAQUE  IOHEXOL  300 MG/ML  SOLN   COMPARISON:  None  Available.   FINDINGS: CT CHEST FINDINGS   Cardiovascular: Normal cardiac size. No pericardial effusion. No aortic aneurysm. There are coronary artery calcifications, in keeping with coronary artery disease. There are also mild peripheral atherosclerotic vascular calcifications of thoracic aorta and its major branches.   Mediastinum/Nodes: Visualized thyroid gland appears grossly unremarkable. No solid / cystic mediastinal masses. The esophagus is nondistended precluding optimal assessment. No axillary, mediastinal or hilar lymphadenopathy by size criteria.   Lungs/Pleura: The central tracheo-bronchial tree is patent. There are patchy areas of linear, plate-like atelectasis and/or scarring throughout bilateral lungs. No mass or consolidation. No pleural effusion or pneumothorax. There multiple, sub 4 mm, solid, calcified and noncalcified nodules throughout bilateral lungs (marked with electronic arrow sign on series 101). These are indeterminate in this patient with known history of malignancy. Attention on follow-up examination is recommended.   Musculoskeletal: The visualized soft tissues of the chest wall are grossly unremarkable. No suspicious osseous lesions. There are mild multilevel degenerative changes in the visualized spine.   CT ABDOMEN PELVIS FINDINGS   Hepatobiliary: The liver is normal in size. Non-cirrhotic configuration. There is a single ill-defined hypoattenuating 2.4 x 2.7 cm lesion in the right hepatic lobe, segment 5, which is incompletely characterized on the current exam. Further evaluation with nonemergent MRI abdomen as per liver mass protocol is recommended. No intrahepatic or extrahepatic bile duct dilation. Gallbladder is surgically absent.   Pancreas: Unremarkable. No pancreatic ductal dilatation or surrounding inflammatory changes.   Spleen: Within normal limits. No focal lesion.   Adrenals/Urinary Tract: Adrenal glands are unremarkable.  No suspicious renal mass. No hydronephrosis. No renal or ureteric calculi. Unremarkable urinary bladder.   Stomach/Bowel: No disproportionate dilation of the small or large bowel loops. There is irregular wall thickening of the cecum measuring up to 1.2-1.3 cm which is incompletely evaluated on the current exam. Please correlate with prior colonoscopy. Appendix is unremarkable. There are metallic linear structures in the proximal duodenum, likely from prior intervention. Correlate with history.   Vascular/Lymphatic: No ascites or pneumoperitoneum. There are 2 predominantly calcified structures in the right upper quadrant between the liver and duodenum, which are of unknown etiology but favored benign. These may be likely dropped gallstones from prior cholecystectomy. No abdominal or pelvic lymphadenopathy, by size criteria.  No aneurysmal dilation of the major abdominal arteries. There are mild peripheral atherosclerotic vascular calcifications of the aorta and its major branches.   Reproductive: Normal size prostate. Symmetric seminal vesicles.   Other: There are small fat containing umbilical and left inguinal hernias. The soft tissues and abdominal wall are otherwise unremarkable.   Musculoskeletal: No suspicious osseous lesions. There are mild multilevel degenerative changes in the visualized spine. Left hip arthroplasty noted. L3 vertebral body hemangioma noted.   IMPRESSION: 1. There is a single ill-defined hypoattenuating 2.4 x 2.7 cm lesion in the right hepatic lobe, segment 5, which is incompletely characterized on the current exam. Further evaluation with nonemergent MRI abdomen as per liver mass protocol is recommended. 2. There are multiple, sub 4 mm, solid, calcified and noncalcified nodules throughout bilateral lungs. These are indeterminate in this patient with known history of malignancy. Attention on follow-up examination is recommended. 3. Otherwise no  metastatic disease identified within the chest, abdomen or pelvis. 4. Multiple other nonacute observations, as described above.   Aortic Atherosclerosis (ICD10-I70.0).     Electronically Signed   By: Ree Molt M.D.   On: 11/29/2023 16:47    ASSESSMENT & PLAN:  Patient is a 63 y.o. male presenting for metastatic colon adenocarcinoma  Assessment & Plan Colon adenocarcinoma [facility]) Patient was recently diagnosis of stage IV metastatic colon adenocarcinoma. Biopsy-proven from the liver biopsy.  MMR testing pending CEA elevated at 60.8  - We reviewed the CT scan and MRI reports together.  Considering patient has multiple liver lesions,surgical resection of metastasis might not be an option for him. We will discuss at tumor board. - We can consider to radiate this lesions and treating the colon cancer as probable local disease.  But I am worried that this might not be an option for him considering the number of lesions.  We will still send for radiation oncology evaluation - Will send for Caris NGS from the tissue - We discussed that course of treatment would be based on the Caris NGS results and that he would probably require systemic therapy for a life time . We will discuss extensively at the next visit.   Return to clinic in 2 weeks to discuss results and further management Metastases to the liver [facility]) Patient has colon cancer metastatic to the liver diagnosed with liver biopsy Iron deficiency anemia due to chronic blood loss The most likely cause of his anemia is due to chronic blood loss from colon cancer  Lab Results  Component Value Date   IRON 11 (L) 12/19/2023   TIBC 642 (H) 12/19/2023   FERRITIN 3 (L) 12/19/2023    TSAT: 2  -We discussed some of the risks, benefits, and alternatives of intravenous iron infusions. The patient is symptomatic from anemia and the iron level is critically low. He tolerated oral iron supplement poorly and desires to achieve higher levels of  iron faster for adequate hematopoesis. Some of the side-effects to be expected including risks of infusion reactions, phlebitis, headaches, nausea and fatigue.  The patient is willing to proceed. Patient education material was dispensed. Goal is to keep ferritin level greater than 50 and resolution of anemia -Start oral iron every other day.  Use MiraLAX  for constipation  Repeat labs in 2 months to assess response for IV iron Thrombocytosis Likely secondary to iron deficiency  - Will replace iron as above - Continue to monitor for now    Orders Placed This Encounter  Procedures   Comprehensive metabolic panel  Standing Status:   Future    Number of Occurrences:   1    Expected Date:   12/19/2023    Expiration Date:   03/18/2024   CBC with Differential    Standing Status:   Future    Number of Occurrences:   1    Expected Date:   12/19/2023    Expiration Date:   03/18/2024   Iron and TIBC ([facility] ONLY)    Standing Status:   Future    Number of Occurrences:   1    Expected Date:   12/19/2023    Expiration Date:   03/18/2024   Ferritin    Standing Status:   Future    Number of Occurrences:   1    Expected Date:   12/19/2023    Expiration Date:   03/18/2024   Sample to Blood Bank    Standing Status:   Future    Number of Occurrences:   1    Expected Date:   12/19/2023    Expiration Date:   03/18/2024    The total time spent in the appointment was 60 minutes encounter with patients including review of chart and various tests results, discussions about plan of care and coordination of care plan   All questions were answered. The patient knows to call the clinic with any problems, questions or concerns. No barriers to learning was detected.  Aaron Dry, MD 8/27/20255:11 PM

## 2023-12-19 ENCOUNTER — Inpatient Hospital Stay: Attending: Oncology | Admitting: Oncology

## 2023-12-19 ENCOUNTER — Inpatient Hospital Stay

## 2023-12-19 ENCOUNTER — Encounter: Payer: Self-pay | Admitting: Oncology

## 2023-12-19 ENCOUNTER — Other Ambulatory Visit: Payer: Self-pay

## 2023-12-19 VITALS — BP 146/69 | HR 116 | Temp 98.1°F | Resp 20 | Ht 72.0 in | Wt 313.7 lb

## 2023-12-19 DIAGNOSIS — Z87891 Personal history of nicotine dependence: Secondary | ICD-10-CM | POA: Diagnosis not present

## 2023-12-19 DIAGNOSIS — C180 Malignant neoplasm of cecum: Secondary | ICD-10-CM | POA: Insufficient documentation

## 2023-12-19 DIAGNOSIS — C189 Malignant neoplasm of colon, unspecified: Secondary | ICD-10-CM

## 2023-12-19 DIAGNOSIS — C787 Secondary malignant neoplasm of liver and intrahepatic bile duct: Secondary | ICD-10-CM

## 2023-12-19 DIAGNOSIS — D75839 Thrombocytosis, unspecified: Secondary | ICD-10-CM | POA: Insufficient documentation

## 2023-12-19 DIAGNOSIS — Z800 Family history of malignant neoplasm of digestive organs: Secondary | ICD-10-CM | POA: Insufficient documentation

## 2023-12-19 DIAGNOSIS — D500 Iron deficiency anemia secondary to blood loss (chronic): Secondary | ICD-10-CM | POA: Diagnosis not present

## 2023-12-19 DIAGNOSIS — D62 Acute posthemorrhagic anemia: Secondary | ICD-10-CM

## 2023-12-19 DIAGNOSIS — R970 Elevated carcinoembryonic antigen [CEA]: Secondary | ICD-10-CM | POA: Insufficient documentation

## 2023-12-19 DIAGNOSIS — D509 Iron deficiency anemia, unspecified: Secondary | ICD-10-CM | POA: Insufficient documentation

## 2023-12-19 DIAGNOSIS — Z8043 Family history of malignant neoplasm of testis: Secondary | ICD-10-CM | POA: Diagnosis not present

## 2023-12-19 LAB — CBC WITH DIFFERENTIAL/PLATELET
Abs Granulocyte: 4.3 K/uL (ref 1.5–6.5)
Abs Immature Granulocytes: 0.03 K/uL (ref 0.00–0.07)
Basophils Absolute: 0.1 K/uL (ref 0.0–0.1)
Basophils Relative: 1 %
Eosinophils Absolute: 0.3 K/uL (ref 0.0–0.5)
Eosinophils Relative: 4 %
HCT: 32.6 % — ABNORMAL LOW (ref 39.0–52.0)
Hemoglobin: 8.9 g/dL — ABNORMAL LOW (ref 13.0–17.0)
Immature Granulocytes: 0 %
Lymphocytes Relative: 25 %
Lymphs Abs: 1.8 K/uL (ref 0.7–4.0)
MCH: 17.9 pg — ABNORMAL LOW (ref 26.0–34.0)
MCHC: 27.3 g/dL — ABNORMAL LOW (ref 30.0–36.0)
MCV: 65.6 fL — ABNORMAL LOW (ref 80.0–100.0)
Monocytes Absolute: 0.7 K/uL (ref 0.1–1.0)
Monocytes Relative: 10 %
Neutro Abs: 4.3 K/uL (ref 1.7–7.7)
Neutrophils Relative %: 60 %
Platelets: 454 K/uL — ABNORMAL HIGH (ref 150–400)
RBC: 4.97 MIL/uL (ref 4.22–5.81)
RDW: 26 % — ABNORMAL HIGH (ref 11.5–15.5)
Smear Review: NORMAL
WBC: 7.3 K/uL (ref 4.0–10.5)
nRBC: 0 % (ref 0.0–0.2)

## 2023-12-19 LAB — COMPREHENSIVE METABOLIC PANEL WITH GFR
ALT: 19 U/L (ref 0–44)
AST: 20 U/L (ref 15–41)
Albumin: 4.8 g/dL (ref 3.5–5.0)
Alkaline Phosphatase: 74 U/L (ref 38–126)
Anion gap: 14 (ref 5–15)
BUN: 19 mg/dL (ref 8–23)
CO2: 23 mmol/L (ref 22–32)
Calcium: 9.7 mg/dL (ref 8.9–10.3)
Chloride: 99 mmol/L (ref 98–111)
Creatinine, Ser: 0.87 mg/dL (ref 0.61–1.24)
GFR, Estimated: >60 mL/min (ref >60)
Glucose, Bld: 237 mg/dL — ABNORMAL HIGH (ref 70–99)
Potassium: 3.9 mmol/L (ref 3.5–5.1)
Sodium: 136 mmol/L (ref 135–145)
Total Bilirubin: 0.7 mg/dL (ref 0.0–1.2)
Total Protein: 8.2 g/dL — ABNORMAL HIGH (ref 6.5–8.1)

## 2023-12-19 LAB — IRON AND TIBC
Iron: 11 ug/dL — ABNORMAL LOW (ref 45–182)
Saturation Ratios: 2 % — ABNORMAL LOW (ref 17.9–39.5)
TIBC: 642 ug/dL — ABNORMAL HIGH (ref 250–450)
UIBC: 631 ug/dL

## 2023-12-19 LAB — SAMPLE TO BLOOD BANK

## 2023-12-19 LAB — FERRITIN: Ferritin: 3 ng/mL — ABNORMAL LOW (ref 24–336)

## 2023-12-19 NOTE — Assessment & Plan Note (Addendum)
 Likely secondary to iron deficiency  - Will replace iron as above - Continue to monitor for now

## 2023-12-19 NOTE — Assessment & Plan Note (Signed)
 The most likely cause of his anemia is due to chronic blood loss from colon cancer  Lab Results  Component Value Date   IRON 11 (L) 12/19/2023   TIBC 642 (H) 12/19/2023   FERRITIN 3 (L) 12/19/2023    TSAT: 2  -We discussed some of the risks, benefits, and alternatives of intravenous iron infusions. The patient is symptomatic from anemia and the iron level is critically low. He tolerated oral iron supplement poorly and desires to achieve higher levels of iron faster for adequate hematopoesis. Some of the side-effects to be expected including risks of infusion reactions, phlebitis, headaches, nausea and fatigue.  The patient is willing to proceed. Patient education material was dispensed. Goal is to keep ferritin level greater than 50 and resolution of anemia -Start oral iron every other day.  Use MiraLAX  for constipation  Repeat labs in 2 months to assess response for IV iron

## 2023-12-19 NOTE — Assessment & Plan Note (Signed)
 Patient has colon cancer metastatic to the liver diagnosed with liver biopsy

## 2023-12-19 NOTE — Patient Instructions (Addendum)
 [facility]  Discharge Instructions  You were seen and examined today by Dr. Davonna. Dr. Davonna is a medical oncologist, meaning that she specializes in the treatment of cancer diagnoses. Dr. Davonna discussed your past medical history, family history of cancers, and the events that led to you being here today.  You were referred to Dr. Davonna due a new diagnosis of Stage IV Colon Cancer. The cancer began in the colon and has unfortunately spread beyond the colon and to the liver. Dr. Davonna will refer you to Dr. Dannielle at [facility] for Radiation Therapy evaluation.  If Dr. Dannielle thinks that you will be a radiation therapy candidate, we will also send you back to Dr. Evonnie to discuss colon surgery.  If radiation and surgery is not a good option for you, we will discuss possible chemotherapy options.  Your iron was severely low, we will arrange iron infusions here in the cancer center.  We will send for additional testing on your recent biopsy to see if there are any mutations.  We will call you to arrange your follow-up with Dr. Davonna.  Thank you for choosing  [facility] to provide your oncology and hematology care.   To afford each patient quality time with our provider, please arrive at least 15 minutes before your scheduled appointment time. You may need to reschedule your appointment if you arrive late (10 or more minutes). Arriving late affects you and other patients whose appointments are after yours.  Also, if you miss three or more appointments without notifying the office, you may be dismissed from the clinic at the provider's discretion.    Again, thank you for choosing [facility].  Our hope is that these requests will decrease the amount of time that you wait before being seen by our physicians.   If you have a lab appointment with the Cancer Center - please note that after April 8th, all labs will  be drawn in the cancer center.  You do not have to check in or register with the main entrance as you have in the past but will complete your check-in at the cancer center.            _____________________________________________________________  Should you have questions after your visit to [facility], please contact our office at [phone] and follow the prompts.  Our office hours are 8:00 a.m. to 4:30 p.m. Monday - Thursday and 8:00 a.m. to 2:30 p.m. Friday.  Please note that voicemails left after 4:00 p.m. may not be returned until the following business day.  We are closed weekends and all major holidays.  You do have access to a nurse 24-7, just call the main number to the clinic [phone] and do not press any options, hold on the line and a nurse will answer the phone.    For prescription refill requests, have your pharmacy contact our office and allow 72 hours.    Masks are no longer required in the cancer centers. If you would like for your care team to wear a mask while they are taking care of you, please let them know. You may have one support person who is at least 63 years old accompany you for your appointments.

## 2023-12-19 NOTE — Assessment & Plan Note (Addendum)
 Patient was recently diagnosis of stage IV metastatic colon adenocarcinoma. Biopsy-proven from the liver biopsy.  MMR testing pending CEA elevated at 60.8  - We reviewed the CT scan and MRI reports together.  Considering patient has multiple liver lesions,surgical resection of metastasis might not be an option for him. We will discuss at tumor board. - We can consider to radiate this lesions and treating the colon cancer as probable local disease.  But I am worried that this might not be an option for him considering the number of lesions.  We will still send for radiation oncology evaluation - Will send for Caris NGS from the tissue - We discussed that course of treatment would be based on the Caris NGS results and that he would probably require systemic therapy for a life time . We will discuss extensively at the next visit.   Return to clinic in 2 weeks to discuss results and further management

## 2023-12-20 DIAGNOSIS — C189 Malignant neoplasm of colon, unspecified: Secondary | ICD-10-CM | POA: Diagnosis not present

## 2023-12-20 DIAGNOSIS — C787 Secondary malignant neoplasm of liver and intrahepatic bile duct: Secondary | ICD-10-CM | POA: Diagnosis not present

## 2023-12-21 ENCOUNTER — Ambulatory Visit

## 2023-12-21 ENCOUNTER — Other Ambulatory Visit: Payer: Self-pay

## 2023-12-21 VITALS — BP 130/71 | HR 93 | Temp 97.9°F | Resp 20

## 2023-12-21 DIAGNOSIS — C189 Malignant neoplasm of colon, unspecified: Secondary | ICD-10-CM

## 2023-12-21 DIAGNOSIS — D500 Iron deficiency anemia secondary to blood loss (chronic): Secondary | ICD-10-CM | POA: Diagnosis not present

## 2023-12-21 DIAGNOSIS — C787 Secondary malignant neoplasm of liver and intrahepatic bile duct: Secondary | ICD-10-CM

## 2023-12-21 DIAGNOSIS — Z87891 Personal history of nicotine dependence: Secondary | ICD-10-CM | POA: Diagnosis not present

## 2023-12-21 DIAGNOSIS — C180 Malignant neoplasm of cecum: Secondary | ICD-10-CM | POA: Diagnosis not present

## 2023-12-21 DIAGNOSIS — Z8043 Family history of malignant neoplasm of testis: Secondary | ICD-10-CM | POA: Diagnosis not present

## 2023-12-21 DIAGNOSIS — Z800 Family history of malignant neoplasm of digestive organs: Secondary | ICD-10-CM | POA: Diagnosis not present

## 2023-12-21 DIAGNOSIS — R970 Elevated carcinoembryonic antigen [CEA]: Secondary | ICD-10-CM | POA: Diagnosis not present

## 2023-12-21 DIAGNOSIS — D75839 Thrombocytosis, unspecified: Secondary | ICD-10-CM | POA: Diagnosis not present

## 2023-12-21 MED ORDER — SODIUM CHLORIDE 0.9 % IV SOLN: INTRAVENOUS | Status: DC

## 2023-12-21 MED ORDER — ACETAMINOPHEN 325 MG PO TABS
650.0000 mg | ORAL_TABLET | Freq: Once | ORAL | Status: AC
  Administered 2023-12-21: 650 mg via ORAL
  Filled 2023-12-21: qty 2

## 2023-12-21 MED ORDER — SODIUM CHLORIDE 0.9 % IV SOLN
510.0000 mg | Freq: Once | INTRAVENOUS | Status: AC
  Administered 2023-12-21: 510 mg via INTRAVENOUS
  Filled 2023-12-21: qty 510

## 2023-12-21 MED ORDER — CETIRIZINE HCL 10 MG PO TABS
10.0000 mg | ORAL_TABLET | Freq: Once | ORAL | Status: AC
  Administered 2023-12-21: 10 mg via ORAL
  Filled 2023-12-21: qty 1

## 2023-12-21 NOTE — Progress Notes (Signed)
 Patient tolerated iron infusion with no complaints voiced.  Peripheral IV site clean and dry with good blood return noted before and after infusion. Pt observed for 30 minutes post iron without any complications.  VSS with discharge and left in satisfactory condition with no s/s of distress noted. All follow ups as scheduled.   [name]

## 2023-12-21 NOTE — Patient Instructions (Signed)

## 2023-12-22 ENCOUNTER — Other Ambulatory Visit: Payer: Self-pay | Admitting: Oncology

## 2023-12-22 DIAGNOSIS — C189 Malignant neoplasm of colon, unspecified: Secondary | ICD-10-CM

## 2023-12-22 NOTE — Progress Notes (Signed)
 START ON PATHWAY REGIMEN - Colorectal     A cycle is every 14 days:     Bevacizumab-xxxx      Oxaliplatin      Leucovorin      Fluorouracil      Fluorouracil   **Always confirm dose/schedule in your pharmacy ordering system**  Patient Characteristics: Distant Metastases, Nonsurgical Candidate, Non-KRAS G12C, RAS Mutation Positive/Unknown (BRAF V600 Wild-Type/Unknown), Standard Cytotoxic Therapy, First Line Standard Cytotoxic Therapy, Bevacizumab Eligible, PS = 0,1 Tumor Location: Colon Therapeutic Status: Distant Metastases Microsatellite/Mismatch Repair Status: Unknown BRAF Mutation Status: Awaiting Test Results KRAS/NRAS Mutation Status: Awaiting Test Results Preferred Therapy Approach: Standard Cytotoxic Therapy Standard Cytotoxic Line of Therapy: First Line Standard Cytotoxic Therapy ECOG Performance Status: 0 Bevacizumab Eligibility: Eligible Intent of Therapy: Non-Curative / Palliative Intent, Discussed with Patient

## 2023-12-23 ENCOUNTER — Other Ambulatory Visit: Payer: Self-pay

## 2023-12-25 ENCOUNTER — Encounter: Payer: Self-pay | Admitting: Oncology

## 2023-12-25 NOTE — Progress Notes (Unsigned)
 Patient Care Team: Bertell Satterfield, MD as PCP - General (Internal Medicine) Davonna Siad, MD as Medical Oncologist (Medical Oncology) Celestia Joesph SQUIBB, RN as Oncology Nurse Navigator (Medical Oncology)  Clinic Day:  12/26/2023  Referring physician: Bertell Satterfield, MD   CHIEF COMPLAINT:  CC: Metastatic colon carcinoma   ASSESSMENT & PLAN:   Assessment & Plan: Aaron Matthews.  is a 63 y.o. male with metastatic colon carcinoma  Assessment & Plan Colon adenocarcinoma [facility]) Patient was recently diagnosis of stage IV metastatic colon adenocarcinoma. Biopsy-proven from the liver biopsy.  MMR testing pending.  Caris NGS pending CEA elevated at 60.8  - Discussed patient's case at GI tumor board today.  Confirmed that patient is not a candidate for surgical resection.  Can consider HAIP if needed. -Patient was seen by Dr. Dannielle and is not a candidate for local radiation to liver - We discussed starting chemotherapy next week.  Risk versus benefits discussed in detail.  Plan to start FOLFOX next week and adding bevacizumab later once Caris results are available. -- Discussed risk versus benefits of FOLFOX in detail.The benefits include delayed tumor progression and potential prolongation of life, but risks include significant toxicities such as peripheral neuropathy (numbness or tingling in the hands and feet), fatigue, nausea, diarrhea, and decreased blood cell counts, leading to increased risks of infection, anemia, and bleeding. Patient is in agreement for starting treatment at this time   -Patient has baseline neuropathy.  Can consider dose reducing oxaliplatin or changing to irinotecan if neuropathy worsens. -Proceed with chemotherapy education tomorrow - Proceed with port placement on Friday -Scheduled for cystoscopy chemotherapy on Monday.  Can treat if labs are within parameters -Will repeat CT CAP after 6 cycles of chemotherapy I.e 3 months - Will check CEA with every  other cycle  Return to clinic prior to second cycle of chemotherapy Metastases to the liver [facility]) Patient has colon cancer metastatic to the liver diagnosed with liver biopsy  -Planned chemotherapy as above Iron deficiency anemia due to chronic blood loss The most likely cause of his anemia is due to chronic blood loss from colon cancer  Lab Results  Component Value Date   IRON 11 (L) 12/19/2023   TIBC 642 (H) 12/19/2023   FERRITIN 3 (L) 12/19/2023    TSAT: 2  -S/p 1 dose of IV iron.  Next 1 scheduled for Monday -Continue oral iron every other day  Will repeat labs in 2 months to assess response for IV iron Peripheral polyneuropathy Patient has baseline neuropathy with numbness and tingling in his bilateral feet from diabetes  - Caution with use of oxaliplatin -Can consider dose reducing oxaliplatin or changing to irinotecan if neuropathy worsens. Goals of care, counseling/discussion Discussed goals of care given stage IV cancer diagnosis. Treatment for management, not cure.  Discussed end-of-life preferences, including limited life support unless recovery possible.  - Documented end-of-life preferences, including trial period for life support but not long-term use.    The patient understands the plans discussed today and is in agreement with them.  He knows to contact our office if he develops concerns prior to his next appointment.  I provided 40 minutes of face-to-face time during this encounter and > 50% was spent counseling as documented under my assessment and plan.    Siad Davonna, MD  [facility] - A DEPT OF [facility] [address] [place] [address] Dept: [phone] Dept Fax: [phone]  No orders of the defined types were placed in this encounter.    ONCOLOGY HISTORY:   Diagnosis: Metastatic colon adenocarcinoma   -11/28/2023: Cecal mass biopsy: Invasive moderately  differentiated adenocarcinoma. -11/28/2023: Colonoscopy: Ulcerated tumor in the cecum.  Markedly redundant and elongated colon. -11/29/2023: CT RJE:Uyzmz is a single ill-defined hypoattenuating 2.4 x 2.7 cm lesion in the right hepatic lobe, segment 5, which is incompletely characterized on the current exam. Further evaluation with nonemergent MRI abdomen as per liver mass protocol is recommended. There are multiple, sub 4 mm, solid, calcified and noncalcified nodules throughout bilateral lungs. Otherwise no metastatic disease identified within the chest, abdomen or pelvis. -11/30/2023: MRI liver:There are at least 5 hepatic lesions, compatible with metastases. The dominant lesion is in the right hepatic lobe, segment 5 measuring 2.9 x 3.2 cm. -12/11/2023: Liver, biopsy, right lobe, mass :       Metastatic adenocarcinoma with necrosis, consistent with colorectal primary  -Caris NGS: Pending  Current Treatment:  FOLFOX - planned  INTERVAL HISTORY:   Aaron Lars Duwaine Mickey. is here today for follow up. Patient is accompanied by his daughter today.  Patient is scheduled for a port placement on Friday, getting chemotherapy education tomorrow.  He is scheduled to start chemotherapy with FOLFOX next week.  Discussed and reviewed risk versus benefits of chemotherapy and side effects in detail.Potential side effects include nausea, vomiting, diarrhea, and cold sensitivity, particularly with oxaliplatin, which can cause numbness and tingling in the hands and feet.  He reports some neuropathy in his legs, attributed to his diabetes, causing tingling and reduced sensation in his feet.  He has no difficulty walking because of the neuropathy.   I have reviewed the past medical history, past surgical history, social history and family history with the patient and they are unchanged from previous note.  ALLERGIES:  is allergic to insulin  glargine and penicillins.  MEDICATIONS:  Current Outpatient Medications   Medication Sig Dispense Refill   aspirin  EC 81 MG tablet Take 81 mg by mouth daily. Swallow whole.     atorvastatin (LIPITOR) 20 MG tablet Take 1 tablet by mouth daily.     Continuous Blood Gluc Sensor (FREESTYLE LIBRE 2 SENSOR) MISC Apply topically.     diazepam (VALIUM) 2 MG tablet TAKE 1 TABLET BY MOUTH THREE TIMES DAILY AS NEEDED FOR 15 DAYS     enalapril  (VASOTEC ) 10 MG tablet Take 1 tablet daily in the morning and take the second tablet in the afternoon/evening if your systolic blood pressure (top number) is 120 or above.     fexofenadine (ALLEGRA) 180 MG tablet Take 180 mg by mouth daily.     furosemide (LASIX) 40 MG tablet Take 40 mg by mouth daily.     gabapentin  (NEURONTIN ) 300 MG capsule Take 300 mg by mouth 4 (four) times daily.     gemfibrozil (LOPID) 600 MG tablet Take 600 mg by mouth 2 (two) times daily.     HUMALOG KWIKPEN 100 UNIT/ML KiwkPen Inject 33 Units into the muscle 3 (three) times daily.     insulin  degludec (TRESIBA) 100 UNIT/ML FlexTouch Pen Inject 50 Units into the skin 2 (two) times daily.     JARDIANCE 25 MG TABS tablet Take 25 mg by mouth daily.     meclizine (ANTIVERT) 25 MG tablet Take 25 mg by mouth daily as needed for dizziness.     metFORMIN  (GLUCOPHAGE -XR) 750 MG 24 hr tablet Take 750 mg by mouth daily with breakfast.  MOUNJARO 2.5 MG/0.5ML Pen Inject 2.5 mg into the skin once a week.     pantoprazole  (PROTONIX ) 40 MG tablet Take 40 mg by mouth daily.     sildenafil (VIAGRA) 50 MG tablet      No current facility-administered medications for this visit.    REVIEW OF SYSTEMS:   Constitutional: Denies fevers, chills or abnormal weight loss Eyes: Denies blurriness of vision Ears, nose, mouth, throat, and face: Denies mucositis or sore throat Respiratory: Denies cough, dyspnea or wheezes Cardiovascular: Denies palpitation, chest discomfort or lower extremity swelling Gastrointestinal:  Denies nausea, heartburn or change in bowel habits Skin: Denies  abnormal skin rashes Lymphatics: Denies new lymphadenopathy or easy bruising Neurological:Denies numbness, tingling or new weaknesses Behavioral/Psych: Mood is stable, no new changes  All other systems were reviewed with the patient and are negative.   VITALS:  Blood pressure 133/69, pulse (!) 102, temperature 98 F (36.7 C), temperature source Tympanic, resp. rate 20, weight (!) 315 lb 7.7 oz (143.1 kg), SpO2 96%.  Wt Readings from Last 3 Encounters:  12/26/23 (!) 315 lb 7.7 oz (143.1 kg)  12/19/23 (!) 313 lb 11.4 oz (142.3 kg)  12/11/23 (!) 317 lb (143.8 kg)    Body mass index is 42.79 kg/m.  Performance status (ECOG): 1 - Symptomatic but completely ambulatory  PHYSICAL EXAM:   GENERAL:alert, no distress and comfortable SKIN: skin color, texture, turgor are normal, no rashes or significant lesions LYMPH:  no palpable lymphadenopathy in the cervical, axillary or inguinal LUNGS: clear to auscultation and percussion with normal breathing effort HEART: regular rate & rhythm and no murmurs and no lower extremity edema ABDOMEN:abdomen soft, non-tender and normal bowel sounds Musculoskeletal:no cyanosis of digits and no clubbing  NEURO: alert & oriented x 3 with fluent speech, no focal motor/sensory deficits  LABORATORY DATA:  I have reviewed the data as listed    Component Value Date/Time   NA 136 12/19/2023 1202   K 3.9 12/19/2023 1202   CL 99 12/19/2023 1202   CO2 23 12/19/2023 1202   GLUCOSE 237 (H) 12/19/2023 1202   BUN 19 12/19/2023 1202   CREATININE 0.87 12/19/2023 1202   CALCIUM 9.7 12/19/2023 1202   PROT 8.2 (H) 12/19/2023 1202   ALBUMIN 4.8 12/19/2023 1202   AST 20 12/19/2023 1202   ALT 19 12/19/2023 1202   ALKPHOS 74 12/19/2023 1202   BILITOT 0.7 12/19/2023 1202   GFRNONAA >60 12/19/2023 1202   GFRAA >60 04/16/2016 1019    Lab Results  Component Value Date   WBC 7.3 12/19/2023   NEUTROABS 4.3 12/19/2023   HGB 8.9 (L) 12/19/2023   HCT 32.6 (L) 12/19/2023    MCV 65.6 (L) 12/19/2023   PLT 454 (H) 12/19/2023      Chemistry      Component Value Date/Time   NA 136 12/19/2023 1202   K 3.9 12/19/2023 1202   CL 99 12/19/2023 1202   CO2 23 12/19/2023 1202   BUN 19 12/19/2023 1202   CREATININE 0.87 12/19/2023 1202      Component Value Date/Time   CALCIUM 9.7 12/19/2023 1202   ALKPHOS 74 12/19/2023 1202   AST 20 12/19/2023 1202   ALT 19 12/19/2023 1202   BILITOT 0.7 12/19/2023 1202      Latest Reference Range & Units 12/19/23 12:02  Iron 45 - 182 ug/dL 11 (L)  UIBC ug/dL 368  TIBC 749 - 549 ug/dL 357 (H)  Saturation Ratios 17.9 - 39.5 % 2 (L)  Ferritin  24 - 336 ng/mL 3 (L)  (L): Data is abnormally low (H): Data is abnormally high   Latest Reference Range & Units 11/29/23 15:50  CEA 0.0 - 4.7 ng/mL 60.8 (H)  (H): Data is abnormally high  RADIOGRAPHIC STUDIES: I have personally reviewed the radiological images as listed and agreed with the findings in the report.  US  BIOPSY (LIVER) INDICATION: Right lobe liver mass in a patient with a history of colon cancer.  EXAM: Ultrasound-guided biopsy  MEDICATIONS: None.  ANESTHESIA/SEDATION: Moderate (conscious) sedation was employed during this procedure. A total of Versed  2 mg and Fentanyl  100 mcg was administered intravenously by the radiology nurse.  Total intra-service moderate Sedation Time: 13 minutes. The patient's level of consciousness and vital signs were monitored continuously by radiology nursing throughout the procedure under my direct supervision.  COMPLICATIONS: None immediate.  PROCEDURE: Informed written consent was obtained from the patient after a thorough discussion of the procedural risks, benefits and alternatives. All questions were addressed. Maximal Sterile Barrier Technique was utilized including caps, mask, sterile gowns, sterile gloves, sterile drape, hand hygiene and skin antiseptic. A timeout was performed prior to the initiation of the  procedure.  The liver was evaluated with the patient in a left lateral decubitus position. The segment 5 liver masses again identified. The patient's skin was prepped and draped in the usual sterile fashion. Local anesthesia was achieved by infiltrate in subcutaneous tissue to the liver capsule with 1% lidocaine . A small incision was then made in the patient's right upper quadrant. An 17 gauge introducer was then advanced from the skin through the incision and into the proximal edge of the liver mass. The stylet was then removed and the 18 gauge bio Pince needle was advanced through the introducer under ultrasound guidance. A core needle sample was then obtained and a final image was obtained with the needle in the mass. The sample was evaluated and placed in formalin. A second sample was then obtained in a similar fashion. After the core needle biopsy, the introducer was slowly retracted with color flow imaging demonstrating no active hemorrhage. The introducer was then completely removed from the patient. Sterile dressing applied.  IMPRESSION: Satisfactory core needle biopsy of a right lobe liver mass.  Electronically Signed   By: Cordella Banner   On: 12/11/2023 13:03

## 2023-12-25 NOTE — Assessment & Plan Note (Addendum)
 The most likely cause of his anemia is due to chronic blood loss from colon cancer  Lab Results  Component Value Date   IRON 11 (L) 12/19/2023   TIBC 642 (H) 12/19/2023   FERRITIN 3 (L) 12/19/2023    TSAT: 2  -We discussed some of the risks, benefits, and alternatives of intravenous iron infusions. The patient is symptomatic from anemia and the iron level is critically low. He tolerated oral iron supplement poorly and desires to achieve higher levels of iron faster for adequate hematopoesis. Some of the side-effects to be expected including risks of infusion reactions, phlebitis, headaches, nausea and fatigue.  The patient is willing to proceed. Patient education material was dispensed. Goal is to keep ferritin level greater than 50 and resolution of anemia -Start oral iron every other day.  Use MiraLAX  for constipation  Repeat labs in 2 months to assess response for IV iron

## 2023-12-25 NOTE — Assessment & Plan Note (Addendum)
 Patient was recently diagnosis of stage IV metastatic colon adenocarcinoma. Biopsy-proven from the liver biopsy.  MMR testing pending.  Caris NGS pending CEA elevated at 60.8  - Discussed patient's case at GI tumor board today.  Confirmed that patient is not a candidate for surgical resection.  Can consider HAIP if needed. -Patient was seen by Dr. Dannielle and is not a candidate for local radiation to liver - We discussed starting chemotherapy next week.  Risk versus benefits discussed in detail.  Plan to start FOLFOX next week and adding bevacizumab later once Caris results are available. -- Discussed risk versus benefits of FOLFOX in detail.The benefits include delayed tumor progression and potential prolongation of life, but risks include significant toxicities such as peripheral neuropathy (numbness or tingling in the hands and feet), fatigue, nausea, diarrhea, and decreased blood cell counts, leading to increased risks of infection, anemia, and bleeding. Patient is in agreement for starting treatment at this time   -Patient has baseline neuropathy.  Can consider dose reducing oxaliplatin or changing to irinotecan if neuropathy worsens. -Proceed with chemotherapy education tomorrow - Proceed with port placement on Friday -Scheduled for cystoscopy chemotherapy on Monday.  Can treat if labs are within parameters -Will repeat CT CAP after 6 cycles of chemotherapy I.e 3 months - Will check CEA with every other cycle  Return to clinic prior to second cycle of chemotherapy

## 2023-12-25 NOTE — Assessment & Plan Note (Addendum)
 Patient has colon cancer metastatic to the liver diagnosed with liver biopsy

## 2023-12-26 ENCOUNTER — Ambulatory Visit: Admitting: Internal Medicine

## 2023-12-26 ENCOUNTER — Inpatient Hospital Stay: Attending: Oncology | Admitting: Oncology

## 2023-12-26 VITALS — BP 133/69 | HR 102 | Temp 98.0°F | Resp 20 | Wt 315.5 lb

## 2023-12-26 DIAGNOSIS — R970 Elevated carcinoembryonic antigen [CEA]: Secondary | ICD-10-CM | POA: Diagnosis not present

## 2023-12-26 DIAGNOSIS — R634 Abnormal weight loss: Secondary | ICD-10-CM | POA: Insufficient documentation

## 2023-12-26 DIAGNOSIS — R630 Anorexia: Secondary | ICD-10-CM | POA: Insufficient documentation

## 2023-12-26 DIAGNOSIS — R5383 Other fatigue: Secondary | ICD-10-CM | POA: Diagnosis not present

## 2023-12-26 DIAGNOSIS — E1140 Type 2 diabetes mellitus with diabetic neuropathy, unspecified: Secondary | ICD-10-CM | POA: Insufficient documentation

## 2023-12-26 DIAGNOSIS — R110 Nausea: Secondary | ICD-10-CM | POA: Insufficient documentation

## 2023-12-26 DIAGNOSIS — C180 Malignant neoplasm of cecum: Secondary | ICD-10-CM | POA: Insufficient documentation

## 2023-12-26 DIAGNOSIS — C787 Secondary malignant neoplasm of liver and intrahepatic bile duct: Secondary | ICD-10-CM | POA: Insufficient documentation

## 2023-12-26 DIAGNOSIS — G629 Polyneuropathy, unspecified: Secondary | ICD-10-CM | POA: Diagnosis not present

## 2023-12-26 DIAGNOSIS — Z87891 Personal history of nicotine dependence: Secondary | ICD-10-CM | POA: Diagnosis not present

## 2023-12-26 DIAGNOSIS — D500 Iron deficiency anemia secondary to blood loss (chronic): Secondary | ICD-10-CM | POA: Insufficient documentation

## 2023-12-26 DIAGNOSIS — Z7189 Other specified counseling: Secondary | ICD-10-CM | POA: Insufficient documentation

## 2023-12-26 DIAGNOSIS — Z5111 Encounter for antineoplastic chemotherapy: Secondary | ICD-10-CM | POA: Insufficient documentation

## 2023-12-26 DIAGNOSIS — C189 Malignant neoplasm of colon, unspecified: Secondary | ICD-10-CM

## 2023-12-26 NOTE — Patient Instructions (Signed)
 [facility] at [facility] Discharge Instructions   You were seen and examined today by Dr. Davonna.  She reviewed with your the treatment plan with you.   We will proceed with your education tomorrow.   We will plan to start treatment next week.   Return as scheduled.    Thank you for choosing [facility] at [facility] to provide your oncology and hematology care.  To afford each patient quality time with our provider, please arrive at least 15 minutes before your scheduled appointment time.   If you have a lab appointment with the Cancer Center please come in thru the Main Entrance and check in at the main information desk.  You need to re-schedule your appointment should you arrive 10 or more minutes late.  We strive to give you quality time with our providers, and arriving late affects you and other patients whose appointments are after yours.  Also, if you no show three or more times for appointments you may be dismissed from the clinic at the providers discretion.     Again, thank you for choosing [facility].  Our hope is that these requests will decrease the amount of time that you wait before being seen by our physicians.       _____________________________________________________________  Should you have questions after your visit to [facility], please contact our office at [phone] and follow the prompts.  Our office hours are 8:00 a.m. and 4:30 p.m. Monday - Friday.  Please note that voicemails left after 4:00 p.m. may not be returned until the following business day.  We are closed weekends and major holidays.  You do have access to a nurse 24-7, just call the main number to the clinic [phone] and do not press any options, hold on the line and a nurse will answer the phone.    For prescription refill requests, have your pharmacy contact our office and allow 72 hours.    Due to Covid, you will  need to wear a mask upon entering the hospital. If you do not have a mask, a mask will be given to you at the Main Entrance upon arrival. For doctor visits, patients may have 1 support person age 66 or older with them. For treatment visits, patients can not have anyone with them due to social distancing guidelines and our immunocompromised population.

## 2023-12-26 NOTE — H&P (Signed)
 Chief Complaint: Patient was seen in consultation today for adenocarcinoma of the colon with metastasis, and with consideration for Port-A-Cath placement.  Referring Provider(s): Dr. Mickiel Dry. MD   Supervising Physician: Hughes Simmonds  Patient Status: [facility] - Out-pt  Patient is Full Code  History of Present Illness: Aaron Matthews. is a 63 y.o. male  with PMHx notable for ***     Interventional Radiology was requested for Port-A-Cath placement. Patient is scheduled for same in IR today.   ***Patient is alert and laying in bed, calm.  Patient is currently without any significant complaints.  Patient denies any fevers, headache, chest pain, SOB, cough, abdominal pain, nausea, vomiting or bleeding.     Past Medical History:  Diagnosis Date   Bursitis    right shoulder   Diabetes mellitus    Diastolic dysfunction 01/18/2013   Grade 1   GERD (gastroesophageal reflux disease)    Heart murmur    as achild   Hypertension     Past Surgical History:  Procedure Laterality Date   CHOLECYSTECTOMY     COLONOSCOPY N/A 11/28/2023   Procedure: COLONOSCOPY;  Surgeon: Shaaron Lamar HERO, MD;  Location: [facility] SUITE;  Service: Endoscopy;  Laterality: N/A;   dental extraction     complete   ESOPHAGOGASTRODUODENOSCOPY N/A 11/28/2023   Procedure: EGD (ESOPHAGOGASTRODUODENOSCOPY);  Surgeon: Shaaron Lamar HERO, MD;  Location: [facility];  Service: Endoscopy;  Laterality: N/A;   TONSILLECTOMY     TOTAL HIP ARTHROPLASTY  01/30/2012   Procedure: TOTAL HIP ARTHROPLASTY ANTERIOR APPROACH;  Surgeon: Donnice JONETTA Car, MD;  Location: [facility];  Service: Orthopedics;  Laterality: Left;    Allergies: Insulin  glargine and Penicillins  Medications: Prior to Admission medications   Medication Sig Start Date End Date Taking? Authorizing Provider  aspirin  EC 81 MG tablet Take 81 mg by mouth daily. Swallow whole.    [provider]  atorvastatin (LIPITOR) 20 MG tablet Take 1  tablet by mouth daily. 07/12/20   [provider]  Continuous Blood Gluc Sensor (FREESTYLE LIBRE 2 SENSOR) MISC Apply topically. 09/09/20   [provider]  diazepam (VALIUM) 2 MG tablet TAKE 1 TABLET BY MOUTH THREE TIMES DAILY AS NEEDED FOR 15 DAYS 11/21/23   [provider]  enalapril  (VASOTEC ) 10 MG tablet Take 1 tablet daily in the morning and take the second tablet in the afternoon/evening if your systolic blood pressure (top number) is 120 or above. 01/18/13   Gasper Aquas, MD  fexofenadine (ALLEGRA) 180 MG tablet Take 180 mg by mouth daily.    [provider]  furosemide (LASIX) 40 MG tablet Take 40 mg by mouth daily. 12/18/23   [provider]  gabapentin  (NEURONTIN ) 300 MG capsule Take 300 mg by mouth 4 (four) times daily.    [provider]  gemfibrozil (LOPID) 600 MG tablet Take 600 mg by mouth 2 (two) times daily. 08/25/20   [provider]  HUMALOG KWIKPEN 100 UNIT/ML KiwkPen Inject 33 Units into the muscle 3 (three) times daily. 04/07/16   [provider]  insulin  degludec (TRESIBA) 100 UNIT/ML FlexTouch Pen Inject 50 Units into the skin 2 (two) times daily.    [provider]  JARDIANCE 25 MG TABS tablet Take 25 mg by mouth daily. 09/07/20   [provider]  meclizine (ANTIVERT) 25 MG tablet Take 25 mg by mouth daily as needed for dizziness. 09/24/23   [provider]  metFORMIN  (GLUCOPHAGE -XR) 750 MG  24 hr tablet Take 750 mg by mouth daily with breakfast.    [provider]  MOUNJARO 2.5 MG/0.5ML Pen Inject 2.5 mg into the skin once a week.    [provider]  pantoprazole  (PROTONIX ) 40 MG tablet Take 40 mg by mouth daily.    [provider]  sildenafil (VIAGRA) 50 MG tablet     [provider]     No family history on file.  Social History   Socioeconomic History   Marital status: Married    Spouse name: Not on file   Number of children: Not on file    Years of education: Not on file   Highest education level: Not on file  Occupational History   Not on file  Tobacco Use   Smoking status: Former    Current packs/day: 0.00    Average packs/day: 1 pack/day for 10.0 years (10.0 ttl pk-yrs)    Types: Cigarettes    Start date: 01/25/1983    Quit date: 01/24/1993    Years since quitting: 30.9   Smokeless tobacco: Never  Vaping Use   Vaping status: Never Used  Substance and Sexual Activity   Alcohol use: No   Drug use: No   Sexual activity: Not on file  Other Topics Concern   Not on file  Social History Narrative   Not on file   Social Drivers of Health   Financial Resource Strain: Not on file  Food Insecurity: No Food Insecurity (12/03/2023)   Hunger Vital Sign    Worried About Running Out of Food in the Last Year: Never true    Ran Out of Food in the Last Year: Never true  Transportation Needs: No Transportation Needs (12/03/2023)   PRAPARE - Administrator, Civil Service (Medical): No    Lack of Transportation (Non-Medical): No  Physical Activity: Not on file  Stress: Not on file  Social Connections: Not on file     Review of Systems: A 12 point ROS discussed and pertinent positives are indicated in the HPI above.  All other systems are negative.  Vital Signs: There were no vitals taken for this visit.  Advance Care Plan: The advanced care place/surrogate decision maker was discussed at the time of visit and the patient did not wish to discuss or was not able to name a surrogate decision maker or provide an advance care plan.  Physical Exam  Imaging: US  BIOPSY (LIVER) Result Date: 12/11/2023 INDICATION: Right lobe liver mass in a patient with a history of colon cancer. EXAM: Ultrasound-guided biopsy MEDICATIONS: None. ANESTHESIA/SEDATION: Moderate (conscious) sedation was employed during this procedure. A total of Versed  2 mg and Fentanyl  100 mcg was administered intravenously by the radiology nurse. Total  intra-service moderate Sedation Time: 13 minutes. The patient's level of consciousness and vital signs were monitored continuously by radiology nursing throughout the procedure under my direct supervision. COMPLICATIONS: None immediate. PROCEDURE: Informed written consent was obtained from the patient after a thorough discussion of the procedural risks, benefits and alternatives. All questions were addressed. Maximal Sterile Barrier Technique was utilized including caps, mask, sterile gowns, sterile gloves, sterile drape, hand hygiene and skin antiseptic. A timeout was performed prior to the initiation of the procedure. The liver was evaluated with the patient in a left lateral decubitus position. The segment 5 liver masses again identified. The patient's skin was prepped and draped in the usual sterile fashion. Local anesthesia was achieved by infiltrate in subcutaneous tissue to the liver  capsule with 1% lidocaine . A small incision was then made in the patient's right upper quadrant. An 17 gauge introducer was then advanced from the skin through the incision and into the proximal edge of the liver mass. The stylet was then removed and the 18 gauge bio Pince needle was advanced through the introducer under ultrasound guidance. A core needle sample was then obtained and a final image was obtained with the needle in the mass. The sample was evaluated and placed in formalin. A second sample was then obtained in a similar fashion. After the core needle biopsy, the introducer was slowly retracted with color flow imaging demonstrating no active hemorrhage. The introducer was then completely removed from the patient. Sterile dressing applied. IMPRESSION: Satisfactory core needle biopsy of a right lobe liver mass. Electronically Signed   By: Cordella Banner   On: 12/11/2023 13:03   MR LIVER W WO CONTRAST Result Date: 11/30/2023 CLINICAL DATA:  Hepatocellular carcinoma suspected Liver Cancer. EXAM: MRI ABDOMEN WITHOUT  AND WITH CONTRAST TECHNIQUE: Multiplanar multisequence MR imaging of the abdomen was performed both before and after the administration of intravenous contrast. CONTRAST:  10mL GADAVIST  GADOBUTROL  1 MMOL/ML IV SOLN COMPARISON:  CT scan chest, abdomen and pelvis from 11/29/2023. FINDINGS: Lower chest: Unremarkable MR appearance to the lung bases. No pleural effusion. No pericardial effusion. Normal heart size. Hepatobiliary: The liver is mild-to-moderately enlarged in size. Noncirrhotic configuration. There are at least 5 mildly T2 hyperintense, target like (peripherally irregular rim enhancing) lesions throughout the liver (marked with electronic arrow sign on series 14), compatible with metastases. The dominant lesion is in the right hepatic lobe, 5 measuring 2.9 x 3.2 cm, which corresponds to the observations described on the CT scan from yesterday. The second largest lesion is in the left hepatic lobe, segment 2 near the capsule. These lesions show diffusion restriction on high B value images. The hepatic and portal veins are patent. No intrahepatic or extrahepatic bile duct dilatation. No choledocholithiasis. Status post cholecystectomy. Pancreas: No mass, inflammatory changes or other parenchymal abnormality identified. No main pancreatic duct dilation. Spleen: Mildly enlarged spleen measuring up to 8.9 x 15.7 cm orthogonally on coronal plane. No focal lesion. Adrenals/Urinary Tract: Unremarkable adrenal glands. No hydroureteronephrosis. There is a subcentimeter sized proteinaceous/hemorrhagic cyst in the left kidney interpolar region (series 7 and 14, image 68). There are several additional subcentimeter sized simple cortical cysts/sinus cysts in the left kidney. No suspicious renal lesion. Stomach/Bowel: Visualized portions within the abdomen are unremarkable. No disproportionate dilation of bowel loops. Vascular/Lymphatic: No pathologically enlarged lymph nodes identified. No abdominal aortic aneurysm  demonstrated. No ascites. Other: There are 2 areas of susceptibility artifacts in the region of pylorus/proximal duodenum, which corresponds to the metallic clips seen on the prior CT scan. There is another area of susceptibility artifact near the ileocecal junction region, which also favors a metallic clip. Musculoskeletal: There is a 1.4 x 2.1 cm hemangioma in the L3 vertebral body. Otherwise no suspicious lesion. Minimal degenerative changes of spine noted. IMPRESSION: 1. There are at least 5 hepatic lesions, compatible with metastases. The dominant lesion is in the right hepatic lobe, segment 5 measuring 2.9 x 3.2 cm. 2. Multiple other nonacute observations, as described above. Electronically Signed   By: Ree Molt M.D.   On: 11/30/2023 09:39   CT CHEST ABDOMEN PELVIS W CONTRAST Result Date: 11/29/2023 CLINICAL DATA:  Colon cancer, staging. * Tracking Code: BO * EXAM: CT CHEST, ABDOMEN, AND PELVIS WITH CONTRAST TECHNIQUE: Multidetector CT imaging  of the chest, abdomen and pelvis was performed following the standard protocol during bolus administration of intravenous contrast. RADIATION DOSE REDUCTION: This exam was performed according to the departmental dose-optimization program which includes automated exposure control, adjustment of the mA and/or kV according to patient size and/or use of iterative reconstruction technique. CONTRAST:  100mL OMNIPAQUE  IOHEXOL  300 MG/ML  SOLN COMPARISON:  None Available. FINDINGS: CT CHEST FINDINGS Cardiovascular: Normal cardiac size. No pericardial effusion. No aortic aneurysm. There are coronary artery calcifications, in keeping with coronary artery disease. There are also mild peripheral atherosclerotic vascular calcifications of thoracic aorta and its major branches. Mediastinum/Nodes: Visualized thyroid gland appears grossly unremarkable. No solid / cystic mediastinal masses. The esophagus is nondistended precluding optimal assessment. No axillary, mediastinal or  hilar lymphadenopathy by size criteria. Lungs/Pleura: The central tracheo-bronchial tree is patent. There are patchy areas of linear, plate-like atelectasis and/or scarring throughout bilateral lungs. No mass or consolidation. No pleural effusion or pneumothorax. There multiple, sub 4 mm, solid, calcified and noncalcified nodules throughout bilateral lungs (marked with electronic arrow sign on series 101). These are indeterminate in this patient with known history of malignancy. Attention on follow-up examination is recommended. Musculoskeletal: The visualized soft tissues of the chest wall are grossly unremarkable. No suspicious osseous lesions. There are mild multilevel degenerative changes in the visualized spine. CT ABDOMEN PELVIS FINDINGS Hepatobiliary: The liver is normal in size. Non-cirrhotic configuration. There is a single ill-defined hypoattenuating 2.4 x 2.7 cm lesion in the right hepatic lobe, segment 5, which is incompletely characterized on the current exam. Further evaluation with nonemergent MRI abdomen as per liver mass protocol is recommended. No intrahepatic or extrahepatic bile duct dilation. Gallbladder is surgically absent. Pancreas: Unremarkable. No pancreatic ductal dilatation or surrounding inflammatory changes. Spleen: Within normal limits. No focal lesion. Adrenals/Urinary Tract: Adrenal glands are unremarkable. No suspicious renal mass. No hydronephrosis. No renal or ureteric calculi. Unremarkable urinary bladder. Stomach/Bowel: No disproportionate dilation of the small or large bowel loops. There is irregular wall thickening of the cecum measuring up to 1.2-1.3 cm which is incompletely evaluated on the current exam. Please correlate with prior colonoscopy. Appendix is unremarkable. There are metallic linear structures in the proximal duodenum, likely from prior intervention. Correlate with history. Vascular/Lymphatic: No ascites or pneumoperitoneum. There are 2 predominantly calcified  structures in the right upper quadrant between the liver and duodenum, which are of unknown etiology but favored benign. These may be likely dropped gallstones from prior cholecystectomy. No abdominal or pelvic lymphadenopathy, by size criteria. No aneurysmal dilation of the major abdominal arteries. There are mild peripheral atherosclerotic vascular calcifications of the aorta and its major branches. Reproductive: Normal size prostate. Symmetric seminal vesicles. Other: There are small fat containing umbilical and left inguinal hernias. The soft tissues and abdominal wall are otherwise unremarkable. Musculoskeletal: No suspicious osseous lesions. There are mild multilevel degenerative changes in the visualized spine. Left hip arthroplasty noted. L3 vertebral body hemangioma noted. IMPRESSION: 1. There is a single ill-defined hypoattenuating 2.4 x 2.7 cm lesion in the right hepatic lobe, segment 5, which is incompletely characterized on the current exam. Further evaluation with nonemergent MRI abdomen as per liver mass protocol is recommended. 2. There are multiple, sub 4 mm, solid, calcified and noncalcified nodules throughout bilateral lungs. These are indeterminate in this patient with known history of malignancy. Attention on follow-up examination is recommended. 3. Otherwise no metastatic disease identified within the chest, abdomen or pelvis. 4. Multiple other nonacute observations, as described above. Aortic Atherosclerosis (ICD10-I70.0).  Electronically Signed   By: Ree Molt M.D.   On: 11/29/2023 16:47    Labs:  CBC: Recent Labs    11/30/23 0505 12/05/23 1022 12/11/23 1017 12/19/23 1202  WBC 6.4 4.9 6.1 7.3  HGB 7.8* 8.2* 7.9* 8.9*  HCT 28.9* 30.2* 29.2* 32.6*  PLT 262 269 397 454*    COAGS: Recent Labs    11/27/23 1016 12/05/23 1022 12/11/23 1017  INR 0.9 1.0 1.0  APTT 27  --   --     BMP: Recent Labs    11/27/23 1016 11/28/23 0438 11/30/23 0505 12/19/23 1202  NA 134*  137 137 136  K 4.2 3.9 3.7 3.9  CL 100 101 102 99  CO2 21* 24 25 23   GLUCOSE 282* 140* 198* 237*  BUN 20 15 8 19   CALCIUM 9.2 9.1 9.1 9.7  CREATININE 0.81 0.74 0.71 0.87  GFRNONAA >60 >60 >60 >60    LIVER FUNCTION TESTS: Recent Labs    11/27/23 1016 12/19/23 1202  BILITOT 0.8 0.7  AST 19 20  ALT 15 19  ALKPHOS 58 74  PROT 7.3 8.2*  ALBUMIN 4.1 4.8    TUMOR MARKERS: No results for input(s): AFPTM, CEA, CA199, CHROMGRNA in the last 8760 hours.  Assessment and Plan: ***  Patient presents for scheduled Port-A-Cath placement in IR today.  ***Patient has been NPO since midnight.  All labs and medications are within acceptable parameters.  No pertinent allergies.   Risks and benefits of image guided port-a-catheter placement was discussed with the patient including, but not limited to bleeding, infection, pneumothorax, or fibrin sheath development and need for additional procedures.  All of the patient's questions were answered, patient is agreeable to proceed. Consent signed and in chart.      Thank you for allowing our service to participate in Forbes Loll. 's care.  Electronically Signed: Carlin DELENA Griffon, PA-C   12/26/2023, 3:01 PM      I spent a total of 30 Minutes in face to face in clinical consultation, greater than 50% of which was counseling/coordinating care for adenocarcinoma of the colon with metastasis, and with consideration for Port-A-Cath placement.

## 2023-12-26 NOTE — Patient Instructions (Incomplete)
 [facility] Chemotherapy Teaching    You have been diagnosed with Stage 4 colon cancer by your oncologist.  You will receive the chemotherapies Oxaliplatin, adrucil, and  B vitamin called leucovorin.  The intent of treatment is palliative.  You will receive this treatment every 14 days with visits with your oncologist.    You will see the doctor regularly throughout treatment.  We will obtain blood work from you prior to every treatment and monitor your results to make sure it is safe to give your treatment. The doctor monitors your response to treatment by the way you are feeling, your blood work, and by obtaining scans periodically.  There will be wait times while you are here for treatment.  It will take about 30 minutes to 1 hour for your lab work to result.  Then there will be wait times while pharmacy mixes your medications.    Medications you will receive in the clinic prior to your chemotherapy medications:   Aloxi:  ALOXI is used in adults to help prevent the nausea and vomiting that happens with certain chemotherapy drugs.  Aloxi is a long acting medication, and will remain in your system for about 2 days.    Dexamethasone :  This is a steroid given prior to chemotherapy to help prevent allergic reactions; it may also help prevent and control nausea and diarrhea.    Oxaliplatin (Eloxatin)  About This Drug  Oxaliplatin is used to treat cancer. It is given in the vein (IV).  It takes two hours to infuse.  Possible Side Effects   Bone marrow suppression. This is a decrease in the number of white blood cells, red blood cells, and platelets. This may raise your risk of infection, make you tired and weak (fatigue), and raise your risk of bleeding.   Tiredness   Soreness of the mouth and throat. You may have red areas, white patches, or sores that hurt.   Nausea and vomiting (throwing up)   Diarrhea (loose bowel movements)   Changes in your liver function    Effects on the nerves called peripheral neuropathy. You may feel numbness, tingling, or pain in your hands and feet, and may be worse in cold temperatures. It may be hard for you to button your clothes, open jars, or walk as usual. The effect on the nerves may get worse with more doses of the drug. These effects get better in some people after the drug is stopped but it does not get better in all people  Note: Each of the side effects above was reported in 40% or greater of patients treated with oxaliplatin. Not all possible side effects are included above.   Warnings and Precautions   Allergic reactions, including anaphylaxis, which may be life-threatening are rare but may happen in some patients. Signs of allergic reaction to this drug may be swelling of the face, feeling like your tongue or throat are swelling, trouble breathing, rash, itching, fever, chills, feeling dizzy, and/or feeling that your heart is beating in a fast or not normal way. If this happens, do not take another dose of this drug. You should get urgent medical treatment.   Inflammation (swelling) of the lungs, which may be life-threatening. You may have a dry cough or trouble breathing.   Effects on the nerves (neuropathy) may resolve within 14 days, or it may persist beyond 14 days.   Severe decrease in white blood cells when combined with the chemotherapy agents 5-fluorouracil and leucovorin. This may  be life-threatening.   Severe changes in your liver function   Abnormal heart beat and/or EKG, which can be life-threatening   Rhabdomyolysis- damage to your muscles which may release proteins in your blood and affect how your kidneys work, which can be life-threatening. You may have severe muscle weakness and/or pain, or dark urine.  Important Information   This drug may impair your ability to drive or use machinery. Talk to your doctor and/or nurse about precautions you may need to take.   This drug may be present in  the saliva, tears, sweat, urine, stool, vomit, semen, and vaginal secretions. Talk to your doctor and/or your nurse about the necessary precautions to take during this time.  * The effects on the nerves can be aggravated by exposure to cold. Avoid cold beverages, use of ice and make sure you cover your skin and dress warmly prior to being exposed to cold temperatures while you are receiving treatment with oxaliplatin*   Treating Side Effects   Manage tiredness by pacing your activities for the day.   Be sure to include periods of rest between energy-draining activities.   To decrease the risk of infection, wash your hands regularly.   Avoid close contact with people who have a cold, the flu, or other infections.  Take your temperature as your doctor or nurse tells you, and whenever you feel like you may have a fever.   To help decrease the risk of bleeding, use a soft toothbrush. Check with your nurse before using dental floss.   Be very careful when using knives or tools.   Use an electric shaver instead of a razor.   Drink plenty of fluids (a minimum of eight glasses per day is recommended).   Mouth care is very important. Your mouth care should consist of routine, gentle cleaning of your teeth or dentures and rinsing your mouth with a mixture of 1/2 teaspoon of salt in 8 ounces of water  or 1/2 teaspoon of baking soda in 8 ounces of water . This should be done at least after each meal and at bedtime.   If you have mouth sores, avoid mouthwash that has alcohol. Also avoid alcohol and smoking because they can bother your mouth and throat.   To help with nausea and vomiting, eat small, frequent meals instead of three large meals a day. Choose foods and drinks that are at room temperature. Ask your nurse or doctor about other helpful tips and medicine that is available to help stop or lessen these symptoms.   If you throw up or have loose bowel movements, you should drink more fluids so  that you do not become dehydrated (lack of water  in the body from losing too much fluid).   If you have diarrhea, eat low-fiber foods that are high in protein and calories and avoid foods that can irritate your digestive tracts or lead to cramping.   Ask your nurse or doctor about medicine that can lessen or stop your diarrhea.   If you have numbness and tingling in your hands and feet, be careful when cooking, walking, and handling sharp objects and hot liquids.   Do not drink cold drinks or use ice in beverages. Drink fluids at room temperature or warmer, and drink through a straw.   Wear gloves to touch cold objects, and wear warm clothing and cover you skin during cold weather.   Food and Drug Interactions   There are no known interactions of oxaliplatin with food and other  medications.   This drug may interact with other medicines. Tell your doctor and pharmacist about all the prescription and over-the-counter medicines and dietary supplements (vitamins, minerals, herbs and others) that you are taking at this time. Also, check with your doctor or pharmacist before starting any new prescription or over-the-counter medicines, or dietary supplements to make sure that there are no interactions   When to Call the Doctor  Call your doctor or nurse if you have any of these symptoms and/or any new or unusual symptoms:   Fever of 100.4 F (38 C) or higher   Chills   Tiredness that interferes with your daily activities   Feeling dizzy or lightheaded   Easy bleeding or bruising   Feeling that your heart is beating in a fast or not normal way (palpitations)   Pain in your chest   Dry cough   Trouble breathing   Pain in your mouth or throat that makes it hard to eat or drink   Nausea that stops you from eating or drinking and/or is not relieved by prescribed medicines   Throwing up   Diarrhea, 4 times in one day or diarrhea with lack of strength or a feeling of being  dizzy   Numbness, tingling, or pain in your hands and feet   Signs of possible liver problems: dark urine, pale bowel movements, bad stomach pain, feeling very tired and weak, unusual itching, or yellowing of the eyes or skin   Signs of rhabdomyolysis: decreased urine, very dark urine, muscle pain in the shoulders, thighs, or lower back; muscle weakness or trouble moving arms and legs   Signs of allergic reaction: swelling of the face, feeling like your tongue or throat are swelling, trouble breathing, rash, itching, fever, chills, feeling dizzy, and/or feeling that your heart is beating in a fast or not normal way. If this happens, call 911 for emergency care.   If you think you may be pregnant  Reproduction Warnings   Pregnancy warning: This drug may have harmful effects on the unborn baby. Women of childbearing potential should use effective methods of birth control during your cancer treatment. Let your doctor know right away if you think you may be pregnant or may have impregnated your partner.   Breastfeeding warning: It is not known if this drug passes into breast milk. For this reason, women should talk to their doctor about the risks and benefits of breastfeeding during treatment with this drug because this drug may enter the breast milk and cause harm to a breastfeeding baby.   Fertility warning: Human fertility studies have not been done with this drug. Talk with your doctor or nurse if you plan to have children. Ask for information on sperm or egg banking.   Leucovorin Calcium  About This Drug  Leucovorin is a vitamin. It is used in combination with other cancer fighting drugs such as 5-fluorouracil and methotrexate. Leucovorin is given in the vein (IV).  This drug runs at the same time as the oxaliplatin and takes 2 hours to infuse.   Possible Side Effects  Rash and itching  Note: Leucovorin by itself has very few side effects. Other side effects you may have can be caused  by the other drugs you are taking, such as 5-fluorouracil.   Warnings and Precautions   Allergic reactions, including anaphylaxis are rare but may happen in some patients. Signs of allergic reaction to this drug may be swelling of the face, feeling like your tongue or throat  are swelling, trouble breathing, rash, itching, fever, chills, feeling dizzy, and/or feeling that your heart is beating in a fast or not normal way. If this happens, do not take another dose of this drug. You should get urgent medical treatment.  Food and Drug Interactions   There are no known interactions of leucovorin with food.   This drug may interact with other medicines. Tell your doctor and pharmacist about all the prescription and over-the-counter medicines and dietary supplements (vitamins, minerals, herbs and others) that you are taking at this time.   Also, check with your doctor or pharmacist before starting any new prescription or over-the-counter medicines, or dietary supplements to make sure that there are no interactions.   When to Call the Doctor  Call your doctor or nurse if you have any of these symptoms and/or any new or unusual symptoms:   A new rash or a rash that is not relieved by prescribed medicines   Signs of allergic reaction: swelling of the face, feeling like your tongue or throat are swelling, trouble breathing, rash, itching, fever, chills, feeling dizzy, and/or feeling that your heart is beating in a fast or not normal way. If this happens, call 911 for emergency care.   If you think you may be pregnant   Reproduction Warnings   Pregnancy warning: It is not known if this drug may harm an unborn child. For this reason, be sure to talk with your doctor if you are pregnant or planning to become pregnant while receiving this drug. Let your doctor know right away if you think you may be pregnant   Breastfeeding warning: It is not known if this drug passes into breast milk. For this  reason, women should talk to their doctor about the risks and benefits of breastfeeding during treatment with this drug because this drug may enter the breast milk and cause harm to a breastfeeding baby.   Fertility warning: Human fertility studies have not been done with this drug. Talk with your doctor or nurse if you plan to have children. Ask for information on sperm or egg banking.   5-Fluorouracil (Adrucil; 5FU)  About This Drug  Fluorouracil is used to treat cancer. It is given in the vein (IV). It is given as an IV push from a syringe and also as a continuous infusion given via an ambulatory pump (a pump you take home and wear for a specified amount of time).  Possible Side Effects   Bone marrow suppression. This is a decrease in the number of white blood cells, red blood cells, and platelets. This may raise your risk of infection, make you tired and weak (fatigue), and raise your risk of bleeding   Changes in the tissue of the heart and/or heart attack. Some changes may happen that can cause your heart to have less ability to pump blood.   Blurred vision or other changes in eyesight   Nausea and throwing up (vomiting)   Diarrhea (loose bowel movements)   Ulcers - sores that may cause pain or bleeding in your digestive tract, which includes your mouth, esophagus, stomach, small/large intestines and rectum   Soreness of the mouth and throat. You may have red areas, white patches, or sores that hurt.   Allergic reactions, including anaphylaxis are rare but may happen in some patients. Signs of allergic reaction to this drug may be swelling of the face, feeling like your tongue or throat are swelling, trouble breathing, rash, itching, fever, chills, feeling dizzy, and/or  feeling that your heart is beating in a fast or not normal way. If this happens, do not take another dose of this drug. You should get urgent medical treatment.   Sensitivity to light (photosensitivity).  Photosensitivity means that you may become more sensitive to the sun and/or light. You may get a skin rash/reaction if you are in the sun or are exposed to sun lamps and tanning beds. Your eyes may water  more, mostly in bright light.   Changes in your nail color, nail loss and/or brittle nail   Darkening of the skin, or changes to the color of your skin and/or veins used for infusion   Rash, dry skin, or itching  Note: Not all possible side effects are included above.  Warnings and Precautions   Hand-and-foot syndrome. The palms of your hands or soles of your feet may tingle, become numb, painful, swollen, or red.   Changes in your central nervous system can happen. The central nervous system is made up of your brain and spinal cord. You could feel extreme tiredness, agitation, confusion, hallucinations (see or hear things that are not there), trouble understanding or speaking, loss of control of your bowels or bladder, eyesight changes, numbness or lack of strength to your arms, legs, face, or body, or coma. If you start to have any of these symptoms let your doctor know right away.   Side effects of this drug may be unexpectedly severe in some patients  Note: Some of the side effects above are very rare. If you have concerns and/or questions, please discuss them with your medical team.   Important Information   This drug may be present in the saliva, tears, sweat, urine, stool, vomit, semen, and vaginal secretions. Talk to your doctor and/or your nurse about the necessary precautions to take during this time.   Treating Side Effects   Manage tiredness by pacing your activities for the day.   Be sure to include periods of rest between energy-draining activities.   To help decrease the risk of infections, wash your hands regularly.   Avoid close contact with people who have a cold, the flu, or other infections.   Take your temperature as your doctor or nurse tells you, and  whenever you feel like you may have a fever.   Use a soft toothbrush. Check with your nurse before using dental floss.   Be very careful when using knives or tools.   Use an electric shaver instead of a razor.   If you have a nose bleed, sit with your head tipped slightly forward. Apply pressure by lightly pinching the bridge of your nose between your thumb and forefinger. Call your doctor if you feel dizzy or faint or if the bleeding doesn't stop after 10 to 15 minutes.   Drink plenty of fluids (a minimum of eight glasses per day is recommended).   If you throw up or have loose bowel movements, you should drink more fluids so that you do not  become dehydrated (lack of water  in the body from losing too much fluid).   To help with nausea and vomiting, eat small, frequent meals instead of three large meals a day. Choose foods and drinks that are at room temperature. Ask your nurse or doctor about other helpful tips and medicine that is available to help, stop, or lessen these symptoms.   If you have diarrhea, eat low-fiber foods that are high in protein and calories and avoid foods that can irritate your digestive  tracts or lead to cramping.   Ask your nurse or doctor about medicine that can lessen or stop your diarrhea.   Mouth care is very important. Your mouth care should consist of routine, gentle cleaning of your teeth or dentures and rinsing your mouth with a mixture of 1/2 teaspoon of salt in 8 ounces of water  or 1/2 teaspoon of baking soda in 8 ounces of water . This should be done at least after each meal and at bedtime.   If you have mouth sores, avoid mouthwash that has alcohol. Also avoid alcohol and smoking because they can bother your mouth and throat.   Keeping your nails moisturized may help with brittleness.   To help with itching, moisturize your skin several times day.   Use sunscreen with SPF 30 or higher when you are outdoors even for a short time. Cover up when you  are out in the sun. Wear wide-brimmed hats, long-sleeved shirts, and pants. Keep your neck, chest, and back covered. Wear dark sun glasses when in the sun or bright lights.   If you get a rash do not put anything on it unless your doctor or nurse says you may. Keep the area around the rash clean and dry. Ask your doctor for medicine if your rash bothers you.   Keeping your pain under control is important to your well-being. Please tell your doctor or nurse if you are experiencing pain.   Food and Drug Interactions   There are no known interactions of fluorouracil with food.   Check with your doctor or pharmacist about all other prescription medicines and over-the-counter medicines and dietary supplements (vitamins, minerals, herbs and others) you are taking before starting this medicine as there are known drug interactions with 5-fluoroucacil. Also, check with your doctor or pharmacist before starting any new prescription or over-the-counter medicines, or dietary supplements to make sure that there are no interactions.  When to Call the Doctor  Call your doctor or nurse if you have any of these symptoms and/or any new or unusual symptoms:   Fever of 100.4 F (38 C) or higher   Chills   Easy bleeding or bruising   Nose bleed that doesn't stop bleeding after 10-15 minutes   Trouble breathing   Feeling dizzy or lightheaded   Feeling that your heart is beating in a fast or not normal way (palpitations)   Chest pain or symptoms of a heart attack. Most heart attacks involve pain in the center of the chest that lasts more than a few minutes. The pain may go away and come back or it can be constant. It can feel like pressure, squeezing, fullness, or pain. Sometimes pain is felt in one or both arms, the back, neck, jaw, or stomach. If any of these symptoms last 2 minutes, call 911.   Confusion and/or agitation   Hallucinations   Trouble understanding or speaking   Loss of control of  bowels or bladder   Blurry vision or changes in your eyesight   Headache that does not go away   Numbness or lack of strength to your arms, legs, face, or body   Nausea that stops you from eating or drinking and/or is not relieved by prescribed medicines   Throwing up more than 3 times a day   Diarrhea, 4 times in one day or diarrhea with lack of strength or a feeling of being dizzy   Pain in your mouth or throat that makes it hard to eat or  drink   Pain along the digestive tract - especially if worse after eating   Blood in your vomit (bright red or coffee-ground) and/or stools (bright red, or black/tarry)   Coughing up blood   Tiredness that interferes with your daily activities   Painful, red, or swollen areas on your hands or feet or around your nails   A new rash or a rash that is not relieved by prescribed medicines   Develop sensitivity to sunlight/light   Numbness and/or tingling of your hands and/or feet   Signs of allergic reaction: swelling of the face, feeling like your tongue or throat are swelling, trouble breathing, rash, itching, fever, chills, feeling dizzy, and/or feeling that your heart is beating in a fast or not normal way. If this happens, call 911 for emergency care.   If you think you are pregnant or may have impregnated your partner  Reproduction Warnings   Pregnancy warning: This drug may have harmful effects on the unborn baby. Women of child bearing potential should use effective methods of birth control during your cancer treatment and 3 months after treatment. Men with male partners of childbearing potential should use effective methods of birth control during your cancer treatment and for 3 months after your cancer treatment. Let your doctor know right away if you think you may be pregnant or may have impregnated your partner.   Breastfeeding warning: It is not known if this drug passes into breast milk. For this reason, Women should not  breastfeed during treatment because this drug could enter the breast milk and cause harm to a breastfeeding baby.   Fertility warning: In men and women both, this drug may affect your ability to have children in the future. Talk with your doctor or nurse if you plan to have children. Ask for information on sperm or egg banking.   SELF CARE ACTIVITIES WHILE RECEIVING CHEMOTHERAPY:  Hydration Increase your fluid intake 48 hours prior to treatment and drink at least 8 to 12 cups (64 ounces) of water /decaffeinated beverages per day after treatment. You can still have your cup of coffee or soda but these beverages do not count as part of your 8 to 12 cups that you need to drink daily. No alcohol intake.  Medications Continue taking your normal prescription medication as prescribed.  If you start any new herbal or new supplements please let us  know first to make sure it is safe.  Mouth Care Have teeth cleaned professionally before starting treatment. Keep dentures and partial plates clean. Use soft toothbrush and do not use mouthwashes that contain alcohol. Biotene is a good mouthwash that is available at most pharmacies or may be ordered by calling [phone]. Use warm salt water  gargles (1 teaspoon salt per 1 quart warm water ) before and after meals and at bedtime. If you need dental work, please let the doctor know before you go for your appointment so that we can coordinate the best possible time for you in regards to your chemo regimen. You need to also let your dentist know that you are actively taking chemo. We may need to do labs prior to your dental appointment.  Skin Care Always use sunscreen that has not expired and with SPF (Sun Protection Factor) of 50 or higher. Wear hats to protect your head from the sun. Remember to use sunscreen on your hands, ears, face, & feet.  Use good moisturizing lotions such as udder cream, eucerin, or even Vaseline. Some chemotherapies can cause dry skin,  color changes in your skin and nails.    Avoid long, hot showers or baths. Use gentle, fragrance-free soaps and laundry detergent. Use moisturizers, preferably creams or ointments rather than lotions because the thicker consistency is better at preventing skin dehydration. Apply the cream or ointment within 15 minutes of showering. Reapply moisturizer at night, and moisturize your hands every time after you wash them.  Hair Loss (if your doctor says your hair will fall out)  If your doctor says that your hair is likely to fall out, decide before you begin chemo whether you want to wear a wig. You may want to shop before treatment to match your hair color. Hats, turbans, and scarves can also camouflage hair loss, although some people prefer to leave their heads uncovered. If you go bare-headed outdoors, be sure to use sunscreen on your scalp. Cut your hair short. It eases the inconvenience of shedding lots of hair, but it also can reduce the emotional impact of watching your hair fall out. Don't perm or color your hair during chemotherapy. Those chemical treatments are already damaging to hair and can enhance hair loss. Once your chemo treatments are done and your hair has grown back, it's OK to resume dyeing or perming hair.  With chemotherapy, hair loss is almost always temporary. But when it grows back, it may be a different color or texture. In older adults who still had hair color before chemotherapy, the new growth may be completely gray.  Often, new hair is very fine and soft.  Infection Prevention Please wash your hands for at least 30 seconds using warm soapy water . Handwashing is the #1 way to prevent the spread of germs. Stay away from sick people or people who are getting over a cold. If you develop respiratory systems such as green/yellow mucus production or productive cough or persistent cough let us  know and we will see if you need an antibiotic. It is a good idea to keep a pair of  gloves on when going into grocery stores/[employer] to decrease your risk of coming into contact with germs on the carts, etc. Carry alcohol hand gel with you at all times and use it frequently if out in public. If your temperature reaches 100.5 or higher please call the clinic and let us  know.  If it is after hours or on the weekend please go to the ER if your temperature is over 100.5.  Please have your own personal thermometer at home to use.    Sex and bodily fluids If you are going to have sex, a condom must be used to protect the person that isn't taking chemotherapy. Chemo can decrease your libido (sex drive). For a few days after chemotherapy, chemotherapy can be excreted through your bodily fluids.  When using the toilet please close the lid and flush the toilet twice.  Do this for a few day after you have had chemotherapy.   Effects of chemotherapy on your sex life Some changes are simple and won't last long. They won't affect your sex life permanently.  Sometimes you may feel: too tired not strong enough to be very active sick or sore  not in the mood anxious or low Your anxiety might not seem related to sex. For example, you may be worried about the cancer and how your treatment is going. Or you may be worried about money, or about how you family are coping with your illness.  These things can cause stress, which can affect your interest  in sex. It's important to talk to your partner about how you feel.  Remember - the changes to your sex life don't usually last long. There's usually no medical reason to stop having sex during chemo. The drugs won't have any long term physical effects on your performance or enjoyment of sex. Cancer can't be passed on to your partner during sex  Contraception It's important to use reliable contraception during treatment. Avoid getting pregnant while you or your partner are having chemotherapy. This is because the drugs may harm the baby. Sometimes  chemotherapy drugs can leave a man or woman infertile.  This means you would not be able to have children in the future. You might want to talk to someone about permanent infertility. It can be very difficult to learn that you may no longer be able to have children. Some people find counselling helpful. There might be ways to preserve your fertility, although this is easier for men than for women. You may want to speak to a fertility expert. You can talk about sperm banking or harvesting your eggs. You can also ask about other fertility options, such as donor eggs. If you have or have had breast cancer, your doctor might advise you not to take the contraceptive pill. This is because the hormones in it might affect the cancer. It is not known for sure whether or not chemotherapy drugs can be passed on through semen or secretions from the vagina. Because of this some doctors advise people to use a barrier method if you have sex during treatment. This applies to vaginal, anal or oral sex. Generally, doctors advise a barrier method only for the time you are actually having the treatment and for about a week after your treatment. Advice like this can be worrying, but this does not mean that you have to avoid being intimate with your partner. You can still have close contact with your partner and continue to enjoy sex.  Animals If you have cats or birds we just ask that you not change the litter or change the cage.  Please have someone else do this for you while you are on chemotherapy.   Food Safety During and After Cancer Treatment Food safety is important for people both during and after cancer treatment. Cancer and cancer treatments, such as chemotherapy, radiation therapy, and stem cell/bone marrow transplantation, often weaken the immune system. This makes it harder for your body to protect itself from foodborne illness, also called food poisoning. Foodborne illness is caused by eating food that contains  harmful bacteria, parasites, or viruses.  Foods to avoid Some foods have a higher risk of becoming tainted with bacteria. These include: Unwashed fresh fruit and vegetables, especially leafy vegetables that can hide dirt and other contaminants Raw sprouts, such as alfalfa sprouts Raw or undercooked beef, especially ground beef, or other raw or undercooked meat and poultry Fatty, fried, or spicy foods immediately before or after treatment.  These can sit heavy on your stomach and make you feel nauseous. Raw or undercooked shellfish, such as oysters. Sushi and sashimi, which often contain raw fish.  Unpasteurized beverages, such as unpasteurized fruit juices, raw milk, raw yogurt, or cider Undercooked eggs, such as soft boiled, over easy, and poached; raw, unpasteurized eggs; or foods made with raw egg, such as homemade raw cookie dough and homemade mayonnaise  Simple steps for food safety  Shop smart. Do not buy food stored or displayed in an unclean area. Do not buy bruised or  damaged fruits or vegetables. Do not buy cans that have cracks, dents, or bulges. Pick up foods that can spoil at the end of your shopping trip and store them in a cooler on the way home.  Prepare and clean up foods carefully. Rinse all fresh fruits and vegetables under running water , and dry them with a clean towel or paper towel. Clean the top of cans before opening them. After preparing food, wash your hands for 20 seconds with hot water  and soap. Pay special attention to areas between fingers and under nails. Clean your utensils and dishes with hot water  and soap. Disinfect your kitchen and cutting boards using 1 teaspoon of liquid, unscented bleach mixed into 1 quart of water .    Dispose of old food. Eat canned and packaged food before its expiration date (the "use by" or "best before" date). Consume refrigerated leftovers within 3 to 4 days. After that time, throw out the food. Even if the food does not  smell or look spoiled, it still may be unsafe. Some bacteria, such as Listeria, can grow even on foods stored in the refrigerator if they are kept for too long.  Take precautions when eating out. At restaurants, avoid buffets and salad bars where food sits out for a long time and comes in contact with many people. Food can become contaminated when someone with a virus, often a norovirus, or another "bug" handles it. Put any leftover food in a "to-go" container yourself, rather than having the server do it. And, refrigerate leftovers as soon as you get home. Choose restaurants that are clean and that are willing to prepare your food as you order it cooked.   AT HOME MEDICATIONS:                                                                                                                                                                Compazine /Prochlorperazine  10mg  tablet. Take 1 tablet every 6 hours as needed for nausea/vomiting. (This can make you sleepy)   EMLA  cream. Apply a quarter size amount to port site 1 hour prior to chemo. Do not rub in. Cover with plastic wrap.    Diarrhea Sheet   If you are having loose stools/diarrhea, please purchase Imodium and begin taking as outlined:  At the first sign of poorly formed or loose stools you should begin taking Imodium (loperamide) 2 mg capsules.  Take two tablets (4mg ) followed by one tablet (2mg ) every 2 hours - DO NOT EXCEED 8 tablets in 24 hours.  If it is bedtime and you are having loose stools, take 2 tablets at bedtime, then 2 tablets every 4 hours until morning.   Always call the Cancer Center if you are having loose stools/diarrhea that you can't get under control.  Loose stools/diarrhea leads to dehydration (loss of water ) in your body.  We have other options of trying to get the loose stools/diarrhea to stop but you must let us  know!   Constipation Sheet  Colace - 100 mg capsules - take 2 capsules daily.  If this doesn't help  then you can increase to 2 capsules twice daily.  Please call if the above does not work for you. Do not go more than 2 days without a bowel movement.  It is very important that you do not become constipated.  It will make you feel sick to your stomach (nausea) and can cause abdominal pain and vomiting.  Nausea Sheet   Compazine /Prochlorperazine  10mg  tablet. Take 1 tablet every 6 hours as needed for nausea/vomiting (This can make you drowsy).  If you are having persistent nausea (nausea that does not stop) please call the Cancer Center and let us  know the amount of nausea that you are experiencing.  If you begin to vomit, you need to call the Cancer Center and if it is the weekend and you have vomited more than one time and can't get it to stop-go to the Emergency Room.  Persistent nausea/vomiting can lead to dehydration (loss of fluid in your body) and will make you feel very weak and unwell. Ice chips, sips of clear liquids, foods that are at room temperature, crackers, and toast tend to be better tolerated.   SYMPTOMS TO REPORT AS SOON AS POSSIBLE AFTER TREATMENT:  FEVER GREATER THAN 100.5 F  CHILLS WITH OR WITHOUT FEVER  NAUSEA AND VOMITING THAT IS NOT CONTROLLED WITH YOUR NAUSEA MEDICATION  UNUSUAL SHORTNESS OF BREATH  UNUSUAL BRUISING OR BLEEDING  TENDERNESS IN MOUTH AND THROAT WITH OR WITHOUT   PRESENCE OF ULCERS  URINARY PROBLEMS  BOWEL PROBLEMS  UNUSUAL RASH      Wear comfortable clothing and clothing appropriate for easy access to any Portacath or PICC line. Let us  know if there is anything that we can do to make your therapy better!    What to do if you need assistance after hours or on the weekends: CALL [phone].  HOLD on the line, do not hang up.  You will hear multiple messages but at the end you will be connected with a nurse triage line.  They will contact the doctor if necessary.  Most of the time they will be able to assist you.  Do not call the hospital  operator.      I have been informed and understand all of the instructions given to me and have received a copy. I have been instructed to call the clinic [phone] or my family physician as soon as possible for continued medical care, if indicated. I do not have any more questions at this time but understand that I may call the Cancer Center or the Patient Navigator at [phone] during office hours should I have questions or need assistance in obtaining follow-up care.

## 2023-12-26 NOTE — Assessment & Plan Note (Addendum)
 Discussed goals of care given stage IV cancer diagnosis. Treatment for management, not cure.  Discussed end-of-life preferences, including limited life support unless recovery possible.  - Documented end-of-life preferences, including trial period for life support but not long-term use.

## 2023-12-26 NOTE — Assessment & Plan Note (Addendum)
 Patient has baseline neuropathy with numbness and tingling in his bilateral feet from diabetes  - Caution with use of oxaliplatin -Can consider dose reducing oxaliplatin or changing to irinotecan if neuropathy worsens.

## 2023-12-27 ENCOUNTER — Encounter: Payer: Self-pay | Admitting: Oncology

## 2023-12-27 ENCOUNTER — Inpatient Hospital Stay

## 2023-12-27 ENCOUNTER — Other Ambulatory Visit: Payer: Self-pay | Admitting: Radiology

## 2023-12-27 ENCOUNTER — Other Ambulatory Visit: Payer: Self-pay | Admitting: Student

## 2023-12-27 DIAGNOSIS — C189 Malignant neoplasm of colon, unspecified: Secondary | ICD-10-CM

## 2023-12-27 MED ORDER — ONDANSETRON HCL 8 MG PO TABS
8.0000 mg | ORAL_TABLET | Freq: Three times a day (TID) | ORAL | 1 refills | Status: DC | PRN
  Filled 2024-02-01: qty 36, 12d supply, fill #0

## 2023-12-27 MED ORDER — DEXAMETHASONE 4 MG PO TABS
8.0000 mg | ORAL_TABLET | Freq: Every day | ORAL | 1 refills | Status: AC
Start: 2023-12-27 — End: ?
  Filled 2024-03-18: qty 30, 15d supply, fill #0

## 2023-12-27 MED ORDER — LIDOCAINE-PRILOCAINE 2.5-2.5 % EX CREA
1.0000 | TOPICAL_CREAM | Freq: Once | CUTANEOUS | 3 refills | Status: AC
Start: 2023-12-27 — End: 2024-01-04

## 2023-12-27 MED ORDER — PROCHLORPERAZINE MALEATE 10 MG PO TABS
10.0000 mg | ORAL_TABLET | Freq: Four times a day (QID) | ORAL | 1 refills | Status: AC | PRN
  Filled 2024-01-03: qty 30, 8d supply, fill #0

## 2023-12-27 NOTE — Progress Notes (Signed)

## 2023-12-27 NOTE — Progress Notes (Signed)
 Patient insurance would not cover Feraheme . Orders received to change IV iron to Venofer 500 mg IVPB x 2 doses at least 7 days apart.  V.O. Dr Ivery Molt, PharmD

## 2023-12-27 NOTE — Progress Notes (Signed)
 Pharmacist Chemotherapy Monitoring - Initial Assessment    Anticipated start date: 12/31/23   The following has been reviewed per standard work regarding the patient's treatment regimen: The patient's diagnosis, treatment plan and drug doses, and organ/hematologic function Lab orders and baseline tests specific to treatment regimen  The treatment plan start date, drug sequencing, and pre-medications Prior authorization status  Patient's documented medication list, including drug-drug interaction screen and prescriptions for anti-emetics and supportive care specific to the treatment regimen The drug concentrations, fluid compatibility, administration routes, and timing of the medications to be used The patient's access for treatment and lifetime cumulative dose history, if applicable  The patient's medication allergies and previous infusion related reactions, if applicable   Changes made to treatment plan:  N/A  Follow up needed:  N/A  Patient to receive Venofer 500 mg x 1 more dose, received 1 dose of Feraheme  previously.  Monitor for neuropathy with Oxaliplatin - his dose is quite high due to BSA 2.40m2  Port placement 12/28/23- holding bevacizumab for cycle 1.     Niels FORBES Molt, [facility], 12/27/2023  3:16 PM

## 2023-12-28 ENCOUNTER — Other Ambulatory Visit: Payer: Self-pay

## 2023-12-28 ENCOUNTER — Ambulatory Visit (HOSPITAL_COMMUNITY)
Admission: RE | Admit: 2023-12-28 | Discharge: 2023-12-28 | Disposition: A | Discharge status: Home / Self Care | Source: Ambulatory Visit | Attending: Oncology | Admitting: Oncology

## 2023-12-28 ENCOUNTER — Inpatient Hospital Stay

## 2023-12-28 DIAGNOSIS — C189 Malignant neoplasm of colon, unspecified: Secondary | ICD-10-CM | POA: Insufficient documentation

## 2023-12-28 DIAGNOSIS — Z87891 Personal history of nicotine dependence: Secondary | ICD-10-CM | POA: Insufficient documentation

## 2023-12-28 DIAGNOSIS — C787 Secondary malignant neoplasm of liver and intrahepatic bile duct: Secondary | ICD-10-CM | POA: Insufficient documentation

## 2023-12-28 HISTORY — PX: IR IMAGING GUIDED PORT INSERTION: IMG5740

## 2023-12-28 LAB — GLUCOSE, CAPILLARY
Glucose-Capillary: 260 mg/dL — ABNORMAL HIGH (ref 70–99)
Glucose-Capillary: 210 mg/dL — ABNORMAL HIGH (ref 70–99)

## 2023-12-28 MED ORDER — HEPARIN SOD (PORK) LOCK FLUSH 100 UNIT/ML IV SOLN
INTRAVENOUS | Status: AC
  Filled 2023-12-28: qty 5

## 2023-12-28 MED ORDER — FENTANYL CITRATE (PF) 100 MCG/2ML IJ SOLN
INTRAMUSCULAR | Status: AC
  Filled 2023-12-28: qty 2

## 2023-12-28 MED ORDER — MIDAZOLAM HCL 2 MG/2ML IJ SOLN
INTRAMUSCULAR | Status: AC
  Filled 2023-12-28: qty 2

## 2023-12-28 MED ORDER — MIDAZOLAM HCL 2 MG/2ML IJ SOLN
INTRAMUSCULAR | Status: DC | PRN
  Administered 2023-12-28 (×2): 1 mg via INTRAVENOUS

## 2023-12-28 MED ORDER — LIDOCAINE-EPINEPHRINE 1 %-1:100000 IJ SOLN
INTRAMUSCULAR | Status: AC
  Filled 2023-12-28: qty 1

## 2023-12-28 MED ORDER — FENTANYL CITRATE (PF) 100 MCG/2ML IJ SOLN
INTRAMUSCULAR | Status: DC | PRN
  Administered 2023-12-28 (×2): 50 ug via INTRAVENOUS

## 2023-12-28 MED ORDER — SODIUM CHLORIDE 0.9 % IV SOLN: INTRAVENOUS | Status: DC

## 2023-12-28 NOTE — Progress Notes (Signed)
 The proposed treatment discussed in conference is for discussion purpose only and is not a binding recommendation.  The patients have not been physically examined, or presented with their treatment options.  Therefore, final treatment plans cannot be decided.

## 2023-12-28 NOTE — Procedures (Signed)
 Vascular and Interventional Radiology Procedure Note  Patient: Aaron Matthews. DOB: 10-16-1960 Medical Record Number: [id number] Note Date/Time: 12/28/23 1:54 PM   Performing Physician: Thom Hall, MD Assistant(s): None  Diagnosis: Colon cancer  Procedure: PORT PLACEMENT  Anesthesia: Conscious Sedation Complications: None Estimated Blood Loss: Minimal  Findings:  Successful right-sided port placement, with the tip of the catheter in the proximal right atrium.  Plan: Catheter ready for use.  See detailed procedure note with images in PACS. The patient tolerated the procedure well without incident or complication and was returned to Recovery in stable condition.    Thom Hall, MD [facility] Inter[facility]   Pager. [phone] Clinic. [phone]

## 2023-12-31 ENCOUNTER — Encounter: Payer: Self-pay | Admitting: *Deleted

## 2023-12-31 ENCOUNTER — Inpatient Hospital Stay

## 2023-12-31 ENCOUNTER — Encounter: Payer: Self-pay | Admitting: Oncology

## 2023-12-31 ENCOUNTER — Inpatient Hospital Stay: Admitting: Dietician

## 2023-12-31 VITALS — BP 126/67 | HR 95 | Temp 97.5°F | Resp 18

## 2023-12-31 DIAGNOSIS — C189 Malignant neoplasm of colon, unspecified: Secondary | ICD-10-CM

## 2023-12-31 DIAGNOSIS — C787 Secondary malignant neoplasm of liver and intrahepatic bile duct: Secondary | ICD-10-CM

## 2023-12-31 DIAGNOSIS — Z5111 Encounter for antineoplastic chemotherapy: Secondary | ICD-10-CM | POA: Diagnosis not present

## 2023-12-31 LAB — COMPREHENSIVE METABOLIC PANEL WITH GFR
ALT: 18 U/L (ref 0–44)
AST: 24 U/L (ref 15–41)
Albumin: 4.4 g/dL (ref 3.5–5.0)
Alkaline Phosphatase: 67 U/L (ref 38–126)
Anion gap: 13 (ref 5–15)
BUN: 22 mg/dL (ref 8–23)
CO2: 25 mmol/L (ref 22–32)
Calcium: 9.7 mg/dL (ref 8.9–10.3)
Chloride: 96 mmol/L — ABNORMAL LOW (ref 98–111)
Creatinine, Ser: 0.93 mg/dL (ref 0.61–1.24)
GFR, Estimated: >60 mL/min (ref >60)
Glucose, Bld: 326 mg/dL — ABNORMAL HIGH (ref 70–99)
Potassium: 4.1 mmol/L (ref 3.5–5.1)
Sodium: 134 mmol/L — ABNORMAL LOW (ref 135–145)
Total Bilirubin: 1 mg/dL (ref 0.0–1.2)
Total Protein: 7.7 g/dL (ref 6.5–8.1)

## 2023-12-31 LAB — CBC WITH DIFFERENTIAL/PLATELET
Abs Immature Granulocytes: 0.02 K/uL (ref 0.00–0.07)
Basophils Absolute: 0.1 K/uL (ref 0.0–0.1)
Basophils Relative: 2 %
Eosinophils Absolute: 0.3 K/uL (ref 0.0–0.5)
Eosinophils Relative: 6 %
HCT: 35.3 % — ABNORMAL LOW (ref 39.0–52.0)
Hemoglobin: 9.4 g/dL — ABNORMAL LOW (ref 13.0–17.0)
Immature Granulocytes: 0 %
Lymphocytes Relative: 21 %
Lymphs Abs: 1.2 K/uL (ref 0.7–4.0)
MCH: 19.2 pg — ABNORMAL LOW (ref 26.0–34.0)
MCHC: 26.6 g/dL — ABNORMAL LOW (ref 30.0–36.0)
MCV: 72.2 fL — ABNORMAL LOW (ref 80.0–100.0)
Monocytes Absolute: 0.5 K/uL (ref 0.1–1.0)
Monocytes Relative: 9 %
Neutro Abs: 3.4 K/uL (ref 1.7–7.7)
Neutrophils Relative %: 62 %
Platelets: 256 K/uL (ref 150–400)
RBC: 4.89 MIL/uL (ref 4.22–5.81)
RDW: 29.2 % — ABNORMAL HIGH (ref 11.5–15.5)
Smear Review: NORMAL
WBC: 5.4 K/uL (ref 4.0–10.5)
nRBC: 0 % (ref 0.0–0.2)

## 2023-12-31 LAB — URINALYSIS, DIPSTICK ONLY
Bilirubin Urine: NEGATIVE
Glucose, UA: >=500 mg/dL — AB
Hgb urine dipstick: NEGATIVE
Ketones, ur: NEGATIVE mg/dL
Nitrite: NEGATIVE
Protein, ur: NEGATIVE mg/dL
Specific Gravity, Urine: 1.02 (ref 1.005–1.030)
pH: 5 (ref 5.0–8.0)

## 2023-12-31 LAB — SAMPLE TO BLOOD BANK

## 2023-12-31 MED ORDER — PALONOSETRON HCL INJECTION 0.25 MG/5ML
0.2500 mg | Freq: Once | INTRAVENOUS | Status: AC
  Administered 2023-12-31: 0.25 mg via INTRAVENOUS
  Filled 2023-12-31: qty 5

## 2023-12-31 MED ORDER — DEXAMETHASONE SODIUM PHOSPHATE 10 MG/ML IJ SOLN
10.0000 mg | Freq: Once | INTRAMUSCULAR | Status: AC
  Administered 2023-12-31: 10 mg via INTRAVENOUS
  Filled 2023-12-31: qty 1

## 2023-12-31 MED ORDER — OXALIPLATIN CHEMO INJECTION 100 MG/20ML
85.0000 mg/m2 | Freq: Once | INTRAVENOUS | Status: AC
  Administered 2023-12-31: 250 mg via INTRAVENOUS
  Filled 2023-12-31: qty 40

## 2023-12-31 MED ORDER — INSULIN ASPART 100 UNIT/ML IJ SOLN
10.0000 [IU] | Freq: Once | INTRAMUSCULAR | Status: AC
  Administered 2023-12-31: 10 [IU] via SUBCUTANEOUS

## 2023-12-31 MED ORDER — DEXTROSE 5 % IV SOLN: INTRAVENOUS | Status: DC

## 2023-12-31 MED ORDER — SODIUM CHLORIDE 0.9 % IV SOLN
2400.0000 mg/m2 | INTRAVENOUS | Status: DC
  Administered 2023-12-31: 7000 mg via INTRAVENOUS
  Filled 2023-12-31: qty 140

## 2023-12-31 MED ORDER — FLUOROURACIL CHEMO INJECTION 2.5 GM/50ML
400.0000 mg/m2 | Freq: Once | INTRAVENOUS | Status: AC
  Administered 2023-12-31: 1000 mg via INTRAVENOUS
  Filled 2023-12-31: qty 20

## 2023-12-31 MED ORDER — LEUCOVORIN CALCIUM INJECTION 350 MG
400.0000 mg/m2 | Freq: Once | INTRAVENOUS | Status: AC
  Administered 2023-12-31: 1076 mg via INTRAVENOUS
  Filled 2023-12-31: qty 53.8

## 2023-12-31 NOTE — Progress Notes (Signed)
 Nutrition Assessment   Reason for Assessment: Referral - new colon cancer    ASSESSMENT: 63 year old male with newly diagnosed colon cancer metastatic to liver. He is receiving palliative chemotherapy with Folfox + Beva q14d as well as IV iron  as needed.   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 Patient reports doing well today, other than persistent fatigue related to IDA. He has a good appetite at baseline. Eats 3 good meals. Recalls 3 hotdogs, baked beans, slaw, and banana pudding at family cook out last night. Likes progresso chicken tortilla soup and ham sandwich for lunch. Patient reports eating bowl of frosted flakes and fruit this morning. He is not surprised blood sugars are elevated. Patient is taking DM medications as prescribed. Patient does not care much for water . Wife makes her own sugar free syrups to flavor water  at home. He is going to work to increase this. Patient mostly drinks tea and diet ginger ale. Patient has BLE neuropathy related to diabetes which limits his daily activity. His goal is to be able to walk down the ramp into his backyard. Patient has a good support system at home with daughter and wife.   Nutrition Focused Physical Exam: deferred    Medications: lipitor, decadron , valium, vasotec , lasix 40 mg, gabapentin , lopid , humalog, jardiance , antivert, metformin , monjaro, zofran , protonix , compazine , viagra   Labs: Na 134, glucose 324   Anthropometrics:   Height: 6 Weight: 315 lb  UBW: 314-317 lb BMI: 42.72   NUTRITION DIAGNOSIS: Food and nutrition related knowledge deficit related to cancer as evidenced by no prior need for associated nutrition information   INTERVENTION:  Discussed strategies for blood glucose management, recommend 3 meals with focus on protein and encourage balanced snacks - offered ideas + handout with  tips provided Discussed importance of adequate hydration for blood glucose as well as during treatment - handout provided Encourage activity as able, suggested ways to move body/engage muscles while seated Discussed cold sensitivity side effects of oxaliplatin  - handout with tips + warm supplement ideas provided Suggested trying Ensure Max for low sugar high protein supplement (samples + coupons provided) Contact information given    MONITORING, EVALUATION, GOAL: Patient will tolerate adequate calories and protein to minimize loss of LBM during treatment    Next Visit: Monday September 22 during infusion

## 2023-12-31 NOTE — Progress Notes (Signed)
 Adjust dose of Venofer  to 400 mg IV to coincide with pump DC day and decrease chair time.  V.O. Dr Ivery Molt, PharmD

## 2023-12-31 NOTE — Progress Notes (Signed)
 Labs reviewed today, ok to treat C1D1 today.    Blood sugar is 326 today, will give 10 units of novolog  insulin  per standing orders.   Ambulatory chemotherapy pump education reviewed, patient and daughter understood and both watched the infusystem video on the ambulatory pump. Ambulatory pump troubleshooting information sheet reviewed and given to patient.    Treatment given per orders. Patient tolerated it well without problems. Vitals stable and discharged home from clinic via wheelchair. Follow up as scheduled.

## 2023-12-31 NOTE — Patient Instructions (Signed)
 [facility] [place] - A DEPT OF [facility]  Discharge Instructions: Thank you for choosing [facility] to provide your oncology and hematology care.  If you have a lab appointment with the Cancer Center - please note that after April 8th, 2024, all labs will be drawn in the cancer center.  You do not have to check in or register with the main entrance as you have in the past but will complete your check-in in the cancer center.  Wear comfortable clothing and clothing appropriate for easy access to any Portacath or PICC line.   We strive to give you quality time with your provider. You may need to reschedule your appointment if you arrive late (15 or more minutes).  Arriving late affects you and other patients whose appointments are after yours.  Also, if you miss three or more appointments without notifying the office, you may be dismissed from the clinic at the provider's discretion.      For prescription refill requests, have your pharmacy contact our office and allow 72 hours for refills to be completed.    Today you received the following chemotherapy and/or immunotherapy agents oxaliplatin, leucovorin, adruicil.       To help prevent nausea and vomiting after your treatment, we encourage you to take your nausea medication as directed.  BELOW ARE SYMPTOMS THAT SHOULD BE REPORTED IMMEDIATELY: *FEVER GREATER THAN 100.4 F (38 C) OR HIGHER *CHILLS OR SWEATING *NAUSEA AND VOMITING THAT IS NOT CONTROLLED WITH YOUR NAUSEA MEDICATION *UNUSUAL SHORTNESS OF BREATH *UNUSUAL BRUISING OR BLEEDING *URINARY PROBLEMS (pain or burning when urinating, or frequent urination) *BOWEL PROBLEMS (unusual diarrhea, constipation, pain near the anus) TENDERNESS IN MOUTH AND THROAT WITH OR WITHOUT PRESENCE OF ULCERS (sore throat, sores in mouth, or a toothache) UNUSUAL RASH, SWELLING OR PAIN  UNUSUAL VAGINAL DISCHARGE OR ITCHING   Items with * indicate a potential emergency  and should be followed up as soon as possible or go to the Emergency Department if any problems should occur.  Please show the CHEMOTHERAPY ALERT CARD or IMMUNOTHERAPY ALERT CARD at check-in to the Emergency Department and triage nurse.  Should you have questions after your visit or need to cancel or reschedule your appointment, please contact [facility] [place] - A DEPT OF [facility] [phone]  and follow the prompts.  Office hours are 8:00 a.m. to 4:30 p.m. Monday - Friday. Please note that voicemails left after 4:00 p.m. may not be returned until the following business day.  We are closed weekends and major holidays. You have access to a nurse at all times for urgent questions. Please call the main number to the clinic [phone] and follow the prompts.  For any non-urgent questions, you may also contact your provider using MyChart. We now offer e-Visits for anyone 7 and older to request care online for non-urgent symptoms. For details visit [email or web address].   Also download the MyChart app! Go to the app store, search "MyChart", open the app, select [facility], and log in with your MyChart username and password.

## 2024-01-01 ENCOUNTER — Inpatient Hospital Stay

## 2024-01-01 LAB — CEA: CEA: 111 ng/mL — ABNORMAL HIGH (ref 0.0–4.7)

## 2024-01-02 ENCOUNTER — Inpatient Hospital Stay

## 2024-01-02 ENCOUNTER — Inpatient Hospital Stay: Admitting: Licensed Clinical Social Worker

## 2024-01-02 VITALS — BP 133/71 | HR 89 | Temp 98.4°F | Resp 16

## 2024-01-02 DIAGNOSIS — C189 Malignant neoplasm of colon, unspecified: Secondary | ICD-10-CM

## 2024-01-02 DIAGNOSIS — Z5111 Encounter for antineoplastic chemotherapy: Secondary | ICD-10-CM | POA: Diagnosis not present

## 2024-01-02 DIAGNOSIS — D500 Iron deficiency anemia secondary to blood loss (chronic): Secondary | ICD-10-CM

## 2024-01-02 MED ORDER — SODIUM CHLORIDE 0.9 % IV SOLN: INTRAVENOUS | Status: DC

## 2024-01-02 MED ORDER — IRON SUCROSE 400 MG IVPB - SIMPLE MED
400.0000 mg | Freq: Once | Status: AC
  Administered 2024-01-02: 400 mg via INTRAVENOUS
  Filled 2024-01-02: qty 400

## 2024-01-02 MED ORDER — CETIRIZINE HCL 10 MG/ML IV SOLN: 10.0000 mg | Freq: Once | INTRAVENOUS | Status: DC

## 2024-01-02 MED ORDER — ACETAMINOPHEN 325 MG PO TABS: 650.0000 mg | ORAL_TABLET | Freq: Once | ORAL | Status: DC

## 2024-01-02 NOTE — Progress Notes (Signed)
 Pt presents to cancer center for venofer  infusion. Per pt he took his premedications prior to his appt.   Patient tolerated iron  infusion with no complaints voiced.  Port site clean and dry with good blood return noted before and after infusion.  Band aid applied.  Pt observed for 30 minutes post iron  infusion without any complications. VSS with discharge and left in satisfactory condition with no s/s of distress noted. All follow ups as scheduled.   Madelaine Whipple

## 2024-01-02 NOTE — Progress Notes (Signed)
 24 hour post chemotherapy call back: per pt he is feeling well, eating well, no nausea, and no fatigue. RN educated pt on the importance to notifying the clinic if any complications occur and when to seek emergency care, pt verbalized understanding all questions answered at this time. Cancer center number given to patient.   Lue Sykora

## 2024-01-02 NOTE — Patient Instructions (Signed)

## 2024-01-02 NOTE — Progress Notes (Signed)
 [facility] Clinical Social Work  Initial Assessment   Aaron Ropp Chilton Sallade. is a 63 y.o. year old male accompanied by patient and daughter. Clinical Social Work was referred by medical provider for assessment of psychosocial needs.   SDOH (Social Determinants of Health) assessments performed: Yes SDOH Interventions    Flowsheet Row Office Visit from 12/19/2023 in [facility] [place] [facility] Dept Of . [facility] Telephone from 12/03/2023 in [place] POPULATION HEALTH DEPARTMENT  SDOH Interventions    Food Insecurity Interventions -- Intervention Not Indicated  Housing Interventions Intervention Not Indicated Intervention Not Indicated  Transportation Interventions -- Intervention Not Indicated  Utilities Interventions -- Intervention Not Indicated    SDOH Screenings   Food Insecurity: No Food Insecurity (12/03/2023)  Housing: Low Risk  (12/19/2023)  Transportation Needs: No Transportation Needs (12/03/2023)  Utilities: Not At Risk (12/03/2023)  Depression (PHQ2-9): Low Risk  (12/31/2023)  Tobacco Use: Medium Risk (12/20/2023)   Received from [facility]    PHQ 2/9:    12/31/2023    9:03 AM 12/26/2023    9:15 AM 12/19/2023   11:16 AM  Depression screen PHQ 2/9  Decreased Interest 0 0 0  Down, Depressed, Hopeless 0 0 0  PHQ - 2 Score 0 0 0     Distress Screen completed: Yes    12/27/2023   11:51 AM  ONCBCN DISTRESS SCREENING  Screening Type Initial Screening  How much distress have you been experiencing in the past week? (0-10) 4  Emotional concerns type Worry or anxiety;Fear      Family/Social Information:  Housing Arrangement: patient lives with his wife.  Family members/support persons in your life? Pt's daughter resides nearby.  Pt has family and friends who are able to assist if needed. 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   Income source: Pt has been receiving short term disability and was approved for long term, but ha been having difficulty getting details regarding when he will start receiving checks. Financial concerns: Yes, current concerns Type of concern: Utilities, Rent/ mortgage, and Medical bills Food access concerns: no Religious or spiritual practice: Yes-[employer] Advanced directives: No Services Currently in place:  none  Coping/ Adjustment to diagnosis: Patient understands treatment plan and what happens next? yes Concerns about diagnosis and/or treatment: Overwhelmed by information and Quality of life Patient reported stressors: Therapist, art and/or priorities: pt's priority is to continue treatment w/ the hope of positive results Patient enjoys time with family/ friends Current coping skills/ strengths: Capable of independent living , Motivation for treatment/growth , and Supportive family/friends     SUMMARY: Current SDOH Barriers:  Financial constraints related to limited income  Clinical Social Work Clinical Goal(s):  Explore community resource options for unmet needs related to:  Financial Strain   Interventions: Discussed common feeling and emotions when being diagnosed with cancer, and the importance of support during treatment Informed patient of the support team roles and support services at [facility] Provided CSW contact information and encouraged patient to call with any questions or concerns Referred patient to community resources: Wadie Rung.  Informed of [employer] [place] and criteria to qualify.  Pt to apply for Medicaid and food stamps.  Pt states he has already completed an application for social security disability.    Follow Up Plan: Patient will contact CSW with any support or resource needs Patient verbalizes understanding of  plan: Yes    Devere JONELLE Manna, LCSW Clinical Social Worker [facility]

## 2024-01-03 ENCOUNTER — Encounter: Payer: Self-pay | Admitting: Oncology

## 2024-01-03 ENCOUNTER — Other Ambulatory Visit (HOSPITAL_BASED_OUTPATIENT_CLINIC_OR_DEPARTMENT_OTHER): Payer: Self-pay

## 2024-01-03 MED ORDER — TIRZEPATIDE 2.5 MG/0.5ML ~~LOC~~ SOAJ
2.5000 mg | SUBCUTANEOUS | 1 refills | Status: AC
  Filled 2024-01-14: qty 2, 28d supply, fill #0

## 2024-01-03 MED ORDER — PANTOPRAZOLE SODIUM 40 MG PO TBEC
40.0000 mg | DELAYED_RELEASE_TABLET | Freq: Two times a day (BID) | ORAL | 3 refills | Status: DC
  Filled 2024-01-07 (×2): qty 180, 90d supply, fill #0

## 2024-01-03 MED ORDER — EMPAGLIFLOZIN 25 MG PO TABS
25.0000 mg | ORAL_TABLET | Freq: Every day | ORAL | 3 refills | Status: AC
  Filled 2024-01-21: qty 90, 90d supply, fill #0
  Filled 2024-04-23: qty 90, 90d supply, fill #1

## 2024-01-03 MED ORDER — ENALAPRIL MALEATE 10 MG PO TABS
10.0000 mg | ORAL_TABLET | Freq: Two times a day (BID) | ORAL | 3 refills | Status: AC
  Filled 2024-02-18: qty 180, 90d supply, fill #0
  Filled 2024-05-19: qty 180, 90d supply, fill #1

## 2024-01-03 MED ORDER — ATORVASTATIN CALCIUM 20 MG PO TABS
20.0000 mg | ORAL_TABLET | Freq: Every day | ORAL | 3 refills | Status: AC
  Filled 2024-01-03: qty 90, 90d supply, fill #0
  Filled 2024-04-03: qty 90, 90d supply, fill #1

## 2024-01-03 MED ORDER — ALPHA-LIPOIC ACID 600 MG PO CAPS
600.0000 mg | ORAL_CAPSULE | Freq: Two times a day (BID) | ORAL | 11 refills | Status: AC
  Filled 2024-05-19: qty 60, 30d supply, fill #0

## 2024-01-03 MED ORDER — TADALAFIL 20 MG PO TABS: 20.0000 mg | ORAL_TABLET | Freq: Every day | ORAL | 3 refills | Status: DC

## 2024-01-03 MED ORDER — GEMFIBROZIL 600 MG PO TABS
600.0000 mg | ORAL_TABLET | Freq: Two times a day (BID) | ORAL | 3 refills | Status: AC
  Filled 2024-02-18: qty 180, 90d supply, fill #0
  Filled 2024-05-19: qty 180, 90d supply, fill #1

## 2024-01-03 MED ORDER — INSULIN DEGLUDEC 100 UNIT/ML ~~LOC~~ SOPN
100.0000 [IU] | PEN_INJECTOR | Freq: Two times a day (BID) | SUBCUTANEOUS | 3 refills | Status: AC
  Filled 2024-01-21: qty 90, 45d supply, fill #0
  Filled 2024-04-14: qty 90, 45d supply, fill #1

## 2024-01-07 ENCOUNTER — Other Ambulatory Visit (HOSPITAL_COMMUNITY): Payer: Self-pay

## 2024-01-07 ENCOUNTER — Encounter: Payer: Self-pay | Admitting: Oncology

## 2024-01-07 ENCOUNTER — Other Ambulatory Visit (HOSPITAL_BASED_OUTPATIENT_CLINIC_OR_DEPARTMENT_OTHER): Payer: Self-pay

## 2024-01-08 ENCOUNTER — Inpatient Hospital Stay: Admitting: Oncology

## 2024-01-08 ENCOUNTER — Other Ambulatory Visit (HOSPITAL_BASED_OUTPATIENT_CLINIC_OR_DEPARTMENT_OTHER): Payer: Self-pay

## 2024-01-09 ENCOUNTER — Other Ambulatory Visit: Payer: Self-pay | Admitting: *Deleted

## 2024-01-09 ENCOUNTER — Inpatient Hospital Stay

## 2024-01-09 ENCOUNTER — Other Ambulatory Visit (HOSPITAL_BASED_OUTPATIENT_CLINIC_OR_DEPARTMENT_OTHER): Payer: Self-pay

## 2024-01-09 ENCOUNTER — Other Ambulatory Visit: Payer: Self-pay | Admitting: Oncology

## 2024-01-09 VITALS — BP 112/69 | HR 100 | Temp 98.6°F | Resp 20 | Wt 306.2 lb

## 2024-01-09 DIAGNOSIS — D500 Iron deficiency anemia secondary to blood loss (chronic): Secondary | ICD-10-CM

## 2024-01-09 DIAGNOSIS — Z5111 Encounter for antineoplastic chemotherapy: Secondary | ICD-10-CM | POA: Diagnosis not present

## 2024-01-09 MED ORDER — SODIUM CHLORIDE 0.9 % IV SOLN
400.0000 mg | Freq: Once | INTRAVENOUS | Status: AC
  Administered 2024-01-09: 400 mg via INTRAVENOUS
  Filled 2024-01-09: qty 400

## 2024-01-09 MED ORDER — FUROSEMIDE 40 MG PO TABS: 40.0000 mg | ORAL_TABLET | Freq: Every day | ORAL | 3 refills | Status: DC

## 2024-01-09 MED ORDER — SODIUM CHLORIDE 0.9 % IV SOLN: INTRAVENOUS | Status: DC

## 2024-01-09 MED ORDER — GABAPENTIN 300 MG PO CAPS
300.0000 mg | ORAL_CAPSULE | Freq: Four times a day (QID) | ORAL | 11 refills | Status: DC
  Filled 2024-01-09: qty 120, 30d supply, fill #0

## 2024-01-09 MED ORDER — IRON SUCROSE 400 MG IVPB - SIMPLE MED: 400.0000 mg | Freq: Once | Status: DC

## 2024-01-09 NOTE — Progress Notes (Signed)
Patient presents today for Venofer infusion per providers order.  Vital signs WNL.  Patient has no new complaints at this time.    Stable during infusion without adverse affects.  Vital signs stable.  No complaints at this time.  Discharge from clinic ambulatory in stable condition.  Alert and oriented X 3.  Follow up with [facility] as scheduled.  

## 2024-01-09 NOTE — Patient Instructions (Signed)

## 2024-01-10 ENCOUNTER — Institutional Professional Consult (permissible substitution) (INDEPENDENT_AMBULATORY_CARE_PROVIDER_SITE_OTHER): Admitting: Otolaryngology

## 2024-01-12 ENCOUNTER — Encounter: Payer: Self-pay | Admitting: Oncology

## 2024-01-14 ENCOUNTER — Encounter (HOSPITAL_COMMUNITY): Payer: Self-pay | Admitting: Internal Medicine

## 2024-01-14 ENCOUNTER — Other Ambulatory Visit (HOSPITAL_BASED_OUTPATIENT_CLINIC_OR_DEPARTMENT_OTHER): Payer: Self-pay

## 2024-01-14 ENCOUNTER — Inpatient Hospital Stay (HOSPITAL_BASED_OUTPATIENT_CLINIC_OR_DEPARTMENT_OTHER): Admitting: Physician Assistant

## 2024-01-14 ENCOUNTER — Other Ambulatory Visit: Payer: Self-pay | Admitting: *Deleted

## 2024-01-14 ENCOUNTER — Encounter: Payer: Self-pay | Admitting: Oncology

## 2024-01-14 ENCOUNTER — Other Ambulatory Visit (HOSPITAL_COMMUNITY): Payer: Self-pay

## 2024-01-14 ENCOUNTER — Inpatient Hospital Stay: Admitting: Dietician

## 2024-01-14 ENCOUNTER — Inpatient Hospital Stay

## 2024-01-14 VITALS — BP 115/68 | HR 100 | Temp 98.6°F | Resp 19

## 2024-01-14 VITALS — HR 106 | Wt 303.1 lb

## 2024-01-14 DIAGNOSIS — R110 Nausea: Secondary | ICD-10-CM

## 2024-01-14 DIAGNOSIS — Z5111 Encounter for antineoplastic chemotherapy: Secondary | ICD-10-CM | POA: Diagnosis not present

## 2024-01-14 DIAGNOSIS — C189 Malignant neoplasm of colon, unspecified: Secondary | ICD-10-CM

## 2024-01-14 DIAGNOSIS — R634 Abnormal weight loss: Secondary | ICD-10-CM | POA: Diagnosis not present

## 2024-01-14 DIAGNOSIS — C787 Secondary malignant neoplasm of liver and intrahepatic bile duct: Secondary | ICD-10-CM | POA: Diagnosis not present

## 2024-01-14 LAB — CBC WITH DIFFERENTIAL/PLATELET
Abs Immature Granulocytes: 0.01 K/uL (ref 0.00–0.07)
Basophils Absolute: 0.1 K/uL (ref 0.0–0.1)
Basophils Relative: 2 %
Eosinophils Absolute: 0.1 K/uL (ref 0.0–0.5)
Eosinophils Relative: 3 %
HCT: 38.2 % — ABNORMAL LOW (ref 39.0–52.0)
Hemoglobin: 11 g/dL — ABNORMAL LOW (ref 13.0–17.0)
Immature Granulocytes: 0 %
Lymphocytes Relative: 38 %
Lymphs Abs: 1.3 K/uL (ref 0.7–4.0)
MCH: 21.7 pg — ABNORMAL LOW (ref 26.0–34.0)
MCHC: 28.8 g/dL — ABNORMAL LOW (ref 30.0–36.0)
MCV: 75.5 fL — ABNORMAL LOW (ref 80.0–100.0)
Monocytes Absolute: 0.4 K/uL (ref 0.1–1.0)
Monocytes Relative: 11 %
Neutro Abs: 1.6 K/uL — ABNORMAL LOW (ref 1.7–7.7)
Neutrophils Relative %: 46 %
Platelets: 261 K/uL (ref 150–400)
RBC: 5.06 MIL/uL (ref 4.22–5.81)
RDW: 30.4 % — ABNORMAL HIGH (ref 11.5–15.5)
Smear Review: NORMAL
WBC: 3.4 K/uL — ABNORMAL LOW (ref 4.0–10.5)
nRBC: 0 % (ref 0.0–0.2)

## 2024-01-14 LAB — COMPREHENSIVE METABOLIC PANEL WITH GFR
ALT: 19 U/L (ref 0–44)
AST: 26 U/L (ref 15–41)
Albumin: 4.4 g/dL (ref 3.5–5.0)
Alkaline Phosphatase: 67 U/L (ref 38–126)
Anion gap: 15 (ref 5–15)
BUN: 17 mg/dL (ref 8–23)
CO2: 23 mmol/L (ref 22–32)
Calcium: 9.7 mg/dL (ref 8.9–10.3)
Chloride: 97 mmol/L — ABNORMAL LOW (ref 98–111)
Creatinine, Ser: 0.89 mg/dL (ref 0.61–1.24)
GFR, Estimated: >60 mL/min (ref >60)
Glucose, Bld: 204 mg/dL — ABNORMAL HIGH (ref 70–99)
Potassium: 4.1 mmol/L (ref 3.5–5.1)
Sodium: 135 mmol/L (ref 135–145)
Total Bilirubin: 1 mg/dL (ref 0.0–1.2)
Total Protein: 7.9 g/dL (ref 6.5–8.1)

## 2024-01-14 LAB — MAGNESIUM: Magnesium: 2.4 mg/dL (ref 1.7–2.4)

## 2024-01-14 MED ORDER — PALONOSETRON HCL INJECTION 0.25 MG/5ML
0.2500 mg | Freq: Once | INTRAVENOUS | Status: AC
  Administered 2024-01-14: 0.25 mg via INTRAVENOUS
  Filled 2024-01-14: qty 5

## 2024-01-14 MED ORDER — LEUCOVORIN CALCIUM INJECTION 350 MG
400.0000 mg/m2 | Freq: Once | INTRAVENOUS | Status: AC
  Administered 2024-01-14: 1076 mg via INTRAVENOUS
  Filled 2024-01-14: qty 53.8

## 2024-01-14 MED ORDER — DEXTROSE 5 % IV SOLN: INTRAVENOUS | Status: DC

## 2024-01-14 MED ORDER — DEXAMETHASONE SODIUM PHOSPHATE 10 MG/ML IJ SOLN
10.0000 mg | Freq: Once | INTRAMUSCULAR | Status: AC
  Administered 2024-01-14: 10 mg via INTRAVENOUS
  Filled 2024-01-14: qty 1

## 2024-01-14 MED ORDER — SODIUM CHLORIDE 0.9 % IV SOLN
2400.0000 mg/m2 | INTRAVENOUS | Status: DC
  Administered 2024-01-14: 7000 mg via INTRAVENOUS
  Filled 2024-01-14: qty 140

## 2024-01-14 MED ORDER — SODIUM CHLORIDE 0.9 % IV SOLN
5.0000 mg/kg | Freq: Once | INTRAVENOUS | Status: AC
  Administered 2024-01-14: 700 mg via INTRAVENOUS
  Filled 2024-01-14: qty 16

## 2024-01-14 MED ORDER — OXALIPLATIN CHEMO INJECTION 100 MG/20ML
83.0000 mg/m2 | Freq: Once | INTRAVENOUS | Status: AC
  Administered 2024-01-14: 225 mg via INTRAVENOUS
  Filled 2024-01-14: qty 40

## 2024-01-14 MED ORDER — INSULIN ASPART 100 UNIT/ML IJ SOLN
10.0000 [IU] | Freq: Once | INTRAMUSCULAR | Status: AC
  Administered 2024-01-14: 10 [IU] via SUBCUTANEOUS
  Filled 2024-01-14: qty 1

## 2024-01-14 MED ORDER — FLUOROURACIL CHEMO INJECTION 2.5 GM/50ML
400.0000 mg/m2 | Freq: Once | INTRAVENOUS | Status: AC
  Administered 2024-01-14: 1000 mg via INTRAVENOUS
  Filled 2024-01-14: qty 20

## 2024-01-14 MED ORDER — SODIUM CHLORIDE 0.9 % IV SOLN: INTRAVENOUS | Status: DC

## 2024-01-14 NOTE — Progress Notes (Signed)
 Nutrition Follow-up:  Pt with colon cancer metastatic to liver. He is receiving palliative chemotherapy with Folfox + Beva q14d as well as IV iron  as needed.   Met with patient and daughter in infusion. He reports feeling fine the first few days following first chemotherapy. Patient began having stomach pains/cramping specifically right sided after pump disconnect. Endorses decreased appetite and taste changes. Patient unable to eat typical 2 large plates at meal times. States he stayed in bed all day Friday. This was the worst day. Patient took zofran  with improvement to symptoms. Patient endorses cold sensitivity with touch and taste. He is tolerating room temperature water . He tried Ensure Max supplements which are okay. Patient asking if we have premier samples.    Medications: reviewed   Labs: glucose 204  Anthropometrics: Wt 303 lb 2.1 oz decreased 3% in 26 days - concerning   9/17 - 306 lb 3.5 oz  8/27 - 313 lb 11.4 oz    NUTRITION DIAGNOSIS: Food and nutrition related knowledge deficit - ongoing    INTERVENTION:  Educated on strategies for taste and smell changes - suggested trying baking soda salt water  gargles before eating (handout with tips + recipe) Discussed strategies for poor appetite - recommend small meals/snacks vs attempting 3 large meals (handout with tips provided) Encourage avoiding fried, greasy, fatty proteins for ease of digestion (ham of bone meat, bacon, sausage, higher fat ground beef, fatty cuts of meat) Continue drinking protein shake - suggested increasing if unable to tolerate meal (samples of Ensure Max + Glucerna) Cookbook, high calorie/high protein snack ideas, lean protein sources list  Antiemetics per MD  MONITORING, EVALUATION, GOAL: wt trends, intake    NEXT VISIT: Monday October 6 during infusion

## 2024-01-14 NOTE — Progress Notes (Signed)
 Treatment given today per MD orders.  Stable during infusion without adverse affects.  Vital signs stable.  5FU pump connected and Verified RUN on the screen with the patient.  No complaints at this time.  Discharge from clinic ambulatory in stable condition.  Alert and oriented X 3.  Follow up with [facility] as scheduled.

## 2024-01-14 NOTE — Progress Notes (Signed)
 [facility]   PROGRESS NOTE  Patient Care Team: Bertell Satterfield, MD as PCP - General (Internal Medicine) Davonna Siad, MD as Medical Oncologist (Medical Oncology) Celestia Joesph SQUIBB, RN as Oncology Nurse Navigator (Medical Oncology)   CHIEF COMPLAINTS/PURPOSE OF CONSULTATION:  Metastatic colon adenocarcinoma  MOLECULAR TESTING: Caris testing; KRAS mutant, MS-stable, TMB 4 mut/Mb, PD-L1 TPS 0%.   ONCOLOGIC HISTORY: 11/28/2023: Colonoscopy: Ulcerated tumor in the cecum.  Markedly redundant and elongated colon. Cecal mass biopsy: Invasive moderately differentiated adenocarcinoma. 11/29/2023: CT RJE:Uyzmz is a single ill-defined hypoattenuating 2.4 x 2.7 cm lesion in the right hepatic lobe, segment 5, which is incompletely characterized on the current exam. Further evaluation with nonemergent MRI abdomen as per liver mass protocol is recommended. There are multiple, sub 4 mm, solid, calcified and noncalcified nodules throughout bilateral lungs. Otherwise Aaron metastatic disease identified within the chest, abdomen or pelvis. 11/30/2023: MRI liver:There are at least 5 hepatic lesions, compatible with metastases. The dominant lesion is in the right hepatic lobe, segment 5 measuring 2.9 x 3.2 cm. 12/11/2023: Liver, biopsy, right lobe, mass : Metastatic adenocarcinoma with necrosis, consistent with colorectal primary  12/31/2023: Cycle 1, Day 1 of FOLFOX 01/14/2024: Cycle 2, Day 1 of FOLFOX plus bevacizumab   INTERVAL HISTORY:  Aaron Matthews. 63 y.o. male returns for a toxicity check prior to cycle 2, day 1 of FOLFOX plus bevacizumab .  On exam today, [name] reports that he has started to develop noticeable fatigue, appetite loss and nausea starting day 3 after he.  He was in bed for most of the next 2 to 3 days with decreased p.o. intake.  He did lose approximately 12 pounds since the last visit due to poor appetite.  He has that in the last week, his appetite has  returned and he is trying to eat more.  He is started to supplement his diet with protein shakes.  He reports that Zofran  was more effective than Compazine  for his nausea.  He was only taking both medications as needed and not scheduled.  He denies any bowel habit changes and reports he alternates from diarrhea with constipation.  He denies easy bruising or signs of active bleeding.  Patient has baseline neuropathy in his feet which he reports has improved after his chemotherapy.  He is struggling with insomnia but contributes this to sleeping during the day.  Denies fevers, chills, night sweats, shortness of breath, chest pain or cough.  He has Aaron other complaints.  Rest of the 10 point ROS as below.  MEDICAL HISTORY:  Past Medical History:  Diagnosis Date   Bursitis    right shoulder   Diabetes mellitus    Diastolic dysfunction 01/18/2013   Grade 1   GERD (gastroesophageal reflux disease)    Heart murmur    as achild   Hypertension     SURGICAL HISTORY: Past Surgical History:  Procedure Laterality Date   CHOLECYSTECTOMY     COLONOSCOPY N/A 11/28/2023   Procedure: COLONOSCOPY;  Surgeon: Shaaron Lamar HERO, MD;  Location: [facility];  Service: Endoscopy;  Laterality: N/A;   dental extraction     complete   ESOPHAGOGASTRODUODENOSCOPY N/A 11/28/2023   Procedure: EGD (ESOPHAGOGASTRODUODENOSCOPY);  Surgeon: Shaaron Lamar HERO, MD;  Location: [facility];  Service: Endoscopy;  Laterality: N/A;   IR IMAGING GUIDED PORT INSERTION  12/28/2023   TONSILLECTOMY     TOTAL HIP ARTHROPLASTY  01/30/2012   Procedure: TOTAL HIP ARTHROPLASTY ANTERIOR APPROACH;  Surgeon: Donnice BIRCH  Ernie, MD;  Location: [facility] ORS;  Service: Orthopedics;  Laterality: Left;    SOCIAL HISTORY: Social History   Socioeconomic History   Marital status: Married    Spouse name: Not on file   Number of children: Not on file   Years of education: Not on file   Highest education level: Not on file  Occupational History   Not on file   Tobacco Use   Smoking status: Former    Current packs/day: 0.00    Average packs/day: 1 pack/day for 10.0 years (10.0 ttl pk-yrs)    Types: Cigarettes    Start date: 01/25/1983    Quit date: 01/24/1993    Years since quitting: 30.9   Smokeless tobacco: Never  Vaping Use   Vaping status: Never Used  Substance and Sexual Activity   Alcohol use: Aaron   Drug use: Aaron   Sexual activity: Not on file  Other Topics Concern   Not on file  Social History Narrative   Not on file   Social Drivers of Health   Financial Resource Strain: Not on file  Food Insecurity: Aaron Food Insecurity (12/03/2023)   Hunger Vital Sign    Worried About Running Out of Food in the Last Year: Never true    Ran Out of Food in the Last Year: Never true  Transportation Needs: Aaron Transportation Needs (12/03/2023)   PRAPARE - Administrator, Civil Service (Medical): Aaron    Lack of Transportation (Non-Medical): Aaron  Physical Activity: Not on file  Stress: Not on file  Social Connections: Not on file  Intimate Partner Violence: Not At Risk (12/03/2023)   Humiliation, Afraid, Rape, and Kick questionnaire    Fear of Current or Ex-Partner: Aaron    Emotionally Abused: Aaron    Physically Abused: Aaron    Sexually Abused: Aaron    FAMILY HISTORY: Aaron family history on file.  ALLERGIES:  is allergic to insulin  glargine and penicillins.  MEDICATIONS:  Current Outpatient Medications  Medication Sig Dispense Refill   Alpha-Lipoic Acid 600 MG CAPS Take 1 capsule (600 mg total) by mouth 2 (two) times daily. 60 capsule 11   aspirin  EC 81 MG tablet Take 81 mg by mouth daily. Swallow whole.     atorvastatin  (LIPITOR) 20 MG tablet Take 1 tablet by mouth daily.     atorvastatin  (LIPITOR) 20 MG tablet Take 1 tablet (20 mg total) by mouth daily. 90 tablet 3   Continuous Blood Gluc Sensor (FREESTYLE LIBRE 2 SENSOR) MISC Apply topically.     dexamethasone  (DECADRON ) 4 MG tablet Take 2 tablets (8 mg total) by mouth daily. Start the  day after chemotherapy for 2 days. Take with food. 30 tablet 1   diazepam (VALIUM) 2 MG tablet TAKE 1 TABLET BY MOUTH THREE TIMES DAILY AS NEEDED FOR 15 DAYS     empagliflozin  (JARDIANCE ) 25 MG TABS tablet Take 1 tablet (25 mg total) by mouth daily. 90 tablet 3   enalapril  (VASOTEC ) 10 MG tablet Take 1 tablet daily in the morning and take the second tablet in the afternoon/evening if your systolic blood pressure (top number) is 120 or above.     enalapril  (VASOTEC ) 10 MG tablet Take 1 tablet (10 mg total) by mouth 2 (two) times daily. 180 tablet 3   fexofenadine (ALLEGRA) 180 MG tablet Take 180 mg by mouth daily.     furosemide  (LASIX ) 40 MG tablet Take 1 tablet (40 mg total) by mouth daily. 30 tablet 3  gabapentin  (NEURONTIN ) 300 MG capsule Take 300 mg by mouth 4 (four) times daily.     gabapentin  (NEURONTIN ) 300 MG capsule Take 1 capsule (300 mg total) by mouth 4 (four) times daily. 120 capsule 11   gemfibrozil  (LOPID ) 600 MG tablet Take 600 mg by mouth 2 (two) times daily.     gemfibrozil  (LOPID ) 600 MG tablet Take 1 tablet (600 mg total) by mouth 2 (two) times daily. 180 tablet 3   HUMALOG KWIKPEN 100 UNIT/ML KiwkPen Inject 33 Units into the muscle 3 (three) times daily.     insulin  degludec (TRESIBA ) 100 UNIT/ML FlexTouch Pen Inject 50 Units into the skin 2 (two) times daily.     insulin  degludec (TRESIBA ) 100 UNIT/ML FlexTouch Pen Inject 100 Units into the skin 2 (two) times daily. 90 mL 3   JARDIANCE  25 MG TABS tablet Take 25 mg by mouth daily.     meclizine  (ANTIVERT ) 25 MG tablet Take 25 mg by mouth daily as needed for dizziness.     metFORMIN  (GLUCOPHAGE -XR) 750 MG 24 hr tablet Take 750 mg by mouth daily with breakfast.     MOUNJARO  2.5 MG/0.5ML Pen Inject 2.5 mg into the skin once a week.     ondansetron  (ZOFRAN ) 8 MG tablet Take 1 tablet (8 mg total) by mouth every 8 (eight) hours as needed for nausea or vomiting. Start on the third day after chemotherapy. 30 tablet 1   pantoprazole   (PROTONIX ) 40 MG tablet Take 40 mg by mouth daily.     pantoprazole  (PROTONIX ) 40 MG tablet Take 1 tablet (40 mg total) by mouth 2 (two) times daily. 180 tablet 3   prochlorperazine  (COMPAZINE ) 10 MG tablet Take 1 tablet (10 mg total) by mouth every 6 (six) hours as needed for nausea or vomiting. 30 tablet 1   sildenafil (VIAGRA) 50 MG tablet      tadalafil  (CIALIS ) 20 MG tablet Take 1 tablet (20 mg total) by mouth daily. 90 tablet 3   tirzepatide  (MOUNJARO ) 2.5 MG/0.5ML Pen Inject 2.5 mg into the skin once a week. 4 mL 1   Aaron current facility-administered medications for this visit.    REVIEW OF SYSTEMS:   Constitutional: ( - ) fevers, ( - )  chills , ( - ) night sweats Eyes: ( - ) blurriness of vision, ( - ) double vision, ( - ) watery eyes Ears, nose, mouth, throat, and face: ( - ) mucositis, ( - ) sore throat Respiratory: ( - ) cough, ( - ) dyspnea, ( - ) wheezes Cardiovascular: ( - ) palpitation, ( - ) chest discomfort, ( - ) lower extremity swelling Gastrointestinal:  ( +) nausea, ( - ) heartburn, ( - ) change in bowel habits Skin: ( - ) abnormal skin rashes Lymphatics: ( - ) new lymphadenopathy, ( - ) easy bruising Neurological: ( - ) numbness, ( - ) tingling, ( - ) new weaknesses Behavioral/Psych: ( - ) mood change, ( - ) new changes  All other systems were reviewed with the patient and are negative.  PHYSICAL EXAMINATION: ECOG PERFORMANCE STATUS: 1 - Symptomatic but completely ambulatory  Vitals:   01/14/24 1036  Pulse: (!) 106   Filed Weights   01/14/24 1036  Weight: (!) 303 lb 2.1 oz (137.5 kg)    GENERAL: well appearing male in NAD  SKIN: skin color, texture, turgor are normal, Aaron rashes or significant lesions EYES: conjunctiva are pink and non-injected, sclera clear LUNGS: clear to auscultation and percussion with  normal breathing effort HEART: regular rate & rhythm and Aaron murmurs and Aaron lower extremity edema Musculoskeletal: Aaron cyanosis of digits and Aaron clubbing   PSYCH: alert & oriented x 3, fluent speech NEURO: Aaron focal motor/sensory deficits  LABORATORY DATA:  I have reviewed the data as listed    Latest Ref Rng & Units 12/31/2023    8:24 AM 12/19/2023   12:02 PM 12/11/2023   10:17 AM  CBC  WBC 4.0 - 10.5 K/uL 5.4  7.3  6.1   Hemoglobin 13.0 - 17.0 g/dL 9.4  8.9  7.9   Hematocrit 39.0 - 52.0 % 35.3  32.6  29.2   Platelets 150 - 400 K/uL 256  454  397        Latest Ref Rng & Units 12/31/2023    8:24 AM 12/19/2023   12:02 PM 11/30/2023    5:05 AM  CMP  Glucose 70 - 99 mg/dL 673  762  801   BUN 8 - 23 mg/dL 22  19  8    Creatinine 0.61 - 1.24 mg/dL 9.06  9.12  9.28   Sodium 135 - 145 mmol/L 134  136  137   Potassium 3.5 - 5.1 mmol/L 4.1  3.9  3.7   Chloride 98 - 111 mmol/L 96  99  102   CO2 22 - 32 mmol/L 25  23  25    Calcium  8.9 - 10.3 mg/dL 9.7  9.7  9.1   Total Protein 6.5 - 8.1 g/dL 7.7  8.2    Total Bilirubin 0.0 - 1.2 mg/dL 1.0  0.7    Alkaline Phos 38 - 126 U/L 67  74    AST 15 - 41 U/L 24  20    ALT 0 - 44 U/L 18  19      RADIOGRAPHIC STUDIES: I have personally reviewed the radiological images as listed and agreed with the findings in the report. IR IMAGING GUIDED PORT INSERTION Result Date: 12/28/2023 INDICATION: chemotherapy administration.  History of colon cancer. EXAM: IMPLANTED PORT A CATH PLACEMENT WITH ULTRASOUND AND FLUOROSCOPIC GUIDANCE MEDICATIONS: None ANESTHESIA/SEDATION: Moderate (conscious) sedation was employed during this procedure. A total of Versed  2 mg and Fentanyl  100 mcg was administered intravenously. Moderate Sedation Time: 22 minutes. The patient's level of consciousness and vital signs were monitored continuously by radiology nursing throughout the procedure under my direct supervision. FLUOROSCOPY: Radiation Exposure Index and estimated peak skin dose (PSD); Reference air kerma (RAK), 1.4 mGy. COMPLICATIONS: None immediate. PROCEDURE: The procedure, risks, benefits, and alternatives were explained to the  patient. Questions regarding the procedure were encouraged and answered. The patient understands and consents to the procedure. The RIGHT neck and chest were prepped with chlorhexidine  in a sterile fashion, and a sterile drape was applied covering the operative field. Maximum barrier sterile technique with sterile gowns and gloves were used for the procedure. A timeout was performed prior to the initiation of the procedure. Local anesthesia was provided with 1% lidocaine  with epinephrine . After creating a small venotomy incision, a micropuncture kit was utilized to access the internal jugular vein under direct, real-time ultrasound guidance. Ultrasound image documentation was performed. The microwire was kinked to measure appropriate catheter length. A subcutaneous port pocket was then created along the upper chest wall utilizing a combination of sharp and blunt dissection. The pocket was irrigated with sterile saline. A single lumen power injectable port was chosen for placement. The 8 Fr catheter was tunneled from the port pocket site to the venotomy incision.  The port was placed in the pocket. The external catheter was trimmed to appropriate length. At the venotomy, an 8 Fr peel-away sheath was placed over a guidewire under fluoroscopic guidance. The catheter was then placed through the sheath and the sheath was removed. Final catheter positioning was confirmed and documented with a fluoroscopic spot radiograph. The port was accessed with a Huber needle, aspirated and flushed with heparinized saline. The port pocket incision was closed with interrupted 3-0 Vicryl suture then Dermabond was applied, including at the venotomy incision. Dressings were placed. The patient tolerated the procedure well without immediate post procedural complication. IMPRESSION: Successful placement of a RIGHT internal jugular approach power injectable Port-A-Cath. The tip of the catheter is positioned within the proximal RIGHT atrium.  The catheter is ready for immediate use. Thom Hall, MD [facility] Radiology Electronically Signed   By: Thom Hall M.D.   On: 12/28/2023 16:39    ASSESSMENT & PLAN Adonus Uselman Kodie Kishi. is a 63 y.o. male who presents to the clinic for continued management of metastatic colon cancer.  #Metastatic colon adenocarcinoma: --Biopsy proven from liver biopsy.  --Caris testing showed KRAS mutant, MS-stable, TMB 4 muts/Mb, PD-L1 TPS 0%. --Started systemic chemotherapy with FOLFOX on 12/31/2023. PLAN: --Due for Cycle 2, Day 1 of FOLFOX with addition of bevacizumab  today --Labs from today were reviewed and adequate for treatment. WBC 3.4, Hgb 11.0, Plt 261, ANC 1.6, creatinine and LFTS are normal.  --Proceed with treatment today without any dose modifications --RTC in 2 weeks with labs.l and follow up prior to Cycle 3, Day 1.   #Nausea: --Advised to take zofran  starting Thursday after treatment q 8 hours. Okay add compazine  for breakthrough symptoms.  --If patient has persistent nausea with scheduled PO nausea meds, we can add IV anti-emetic with Day 3 pump disconnect.  #Weight loss: --Will monitor as patient is more aggressive with anti-nausea meds --Advised to eat small, frequent meals and supplement with protein shakes  #Neuropathy: --Present at baseline before chemotherapy.  --Monitor closely with oxaliplatin  therapy   Aaron orders of the defined types were placed in this encounter.   All questions were answered. The patient knows to call the clinic with any problems, questions or concerns.  I have spent a total of 30 minutes minutes of face-to-face and non-face-to-face time, preparing to see the patient, obtaining and/or reviewing separately obtained history, performing a medically appropriate examination, counseling and educating the patient, ordering medications/tests/procedures, documenting clinical information in the electronic health  record, independently interpreting results and communicating results to the patient, and care coordination.   Johnston Police, PA-C Department of Hematology/Oncology [facility]

## 2024-01-14 NOTE — Progress Notes (Signed)
 Message received from I. Thayil PA to proceed with treatment. Glucose 204. Standing orders followed . Patient will receive Novolog  10 units Kirby. Lab work and vital signs within parameters for treatment. Urine protein negative on 12-31-2023. Heart rate 106. CHANETA Police PA aware.

## 2024-01-14 NOTE — Progress Notes (Signed)
 New BSA 2.64 m2 - Oxaliplatin  dose now will be 225 mg (85 mg/m2).  Dr Ivery Molt, PharmD

## 2024-01-14 NOTE — Patient Instructions (Signed)
 [facility] [place] - A DEPT OF [place]  Discharge Instructions: Thank you for choosing [facility] to provide your oncology and hematology care.  If you have a lab appointment with the Cancer Center - please note that after April 8th, 2024, all labs will be drawn in the cancer center.  You do not have to check in or register with the main entrance as you have in the past but will complete your check-in in the cancer center.  Wear comfortable clothing and clothing appropriate for easy access to any Portacath or PICC line.   We strive to give you quality time with your provider. You may need to reschedule your appointment if you arrive late (15 or more minutes).  Arriving late affects you and other patients whose appointments are after yours.  Also, if you miss three or more appointments without notifying the office, you may be dismissed from the clinic at the provider's discretion.      For prescription refill requests, have your pharmacy contact our office and allow 72 hours for refills to be completed.    Today you received the following chemotherapy and/or immunotherapy agents MVASI /Oxaliplatin /Leucovorin       To help prevent nausea and vomiting after your treatment, we encourage you to take your nausea medication as directed.  BELOW ARE SYMPTOMS THAT SHOULD BE REPORTED IMMEDIATELY: *FEVER GREATER THAN 100.4 F (38 C) OR HIGHER *CHILLS OR SWEATING *NAUSEA AND VOMITING THAT IS NOT CONTROLLED WITH YOUR NAUSEA MEDICATION *UNUSUAL SHORTNESS OF BREATH *UNUSUAL BRUISING OR BLEEDING *URINARY PROBLEMS (pain or burning when urinating, or frequent urination) *BOWEL PROBLEMS (unusual diarrhea, constipation, pain near the anus) TENDERNESS IN MOUTH AND THROAT WITH OR WITHOUT PRESENCE OF ULCERS (sore throat, sores in mouth, or a toothache) UNUSUAL RASH, SWELLING OR PAIN  UNUSUAL VAGINAL DISCHARGE OR ITCHING   Items with * indicate a potential emergency and  should be followed up as soon as possible or go to the Emergency Department if any problems should occur.  Please show the CHEMOTHERAPY ALERT CARD or IMMUNOTHERAPY ALERT CARD at check-in to the Emergency Department and triage nurse.  Should you have questions after your visit or need to cancel or reschedule your appointment, please contact [facility] [place] - A DEPT OF [facility] [phone]  and follow the prompts.  Office hours are 8:00 a.m. to 4:30 p.m. Monday - Friday. Please note that voicemails left after 4:00 p.m. may not be returned until the following business day.  We are closed weekends and major holidays. You have access to a nurse at all times for urgent questions. Please call the main number to the clinic [phone] and follow the prompts.  For any non-urgent questions, you may also contact your provider using MyChart. We now offer e-Visits for anyone 88 and older to request care online for non-urgent symptoms. For details visit [email or web address].   Also download the MyChart app! Go to the app store, search MyChart, open the app, select [place], and log in with your MyChart username and password.

## 2024-01-15 ENCOUNTER — Other Ambulatory Visit: Payer: Self-pay

## 2024-01-15 ENCOUNTER — Encounter: Payer: Self-pay | Admitting: Oncology

## 2024-01-15 ENCOUNTER — Ambulatory Visit: Admitting: Internal Medicine

## 2024-01-15 ENCOUNTER — Other Ambulatory Visit (HOSPITAL_BASED_OUTPATIENT_CLINIC_OR_DEPARTMENT_OTHER): Payer: Self-pay

## 2024-01-15 LAB — CEA: CEA: 87.2 ng/mL — ABNORMAL HIGH (ref 0.0–4.7)

## 2024-01-15 MED ORDER — GABAPENTIN 300 MG PO CAPS
300.0000 mg | ORAL_CAPSULE | Freq: Four times a day (QID) | ORAL | 0 refills | Status: DC
  Filled 2024-01-15: qty 90, 23d supply, fill #0

## 2024-01-15 MED ORDER — LIDOCAINE-PRILOCAINE 2.5-2.5 % EX CREA
1.0000 | TOPICAL_CREAM | CUTANEOUS | 3 refills | Status: AC | PRN
  Filled 2024-01-15: qty 30, 30d supply, fill #0

## 2024-01-15 MED ORDER — MECLIZINE HCL 25 MG PO TABS
25.0000 mg | ORAL_TABLET | Freq: Every day | ORAL | 0 refills | Status: AC | PRN
  Filled 2024-01-15: qty 30, 30d supply, fill #0

## 2024-01-15 MED ORDER — METFORMIN HCL ER 750 MG PO TB24
750.0000 mg | ORAL_TABLET | Freq: Every day | ORAL | 0 refills | Status: DC
  Filled 2024-01-15 – 2024-03-18 (×2): qty 30, 30d supply, fill #0

## 2024-01-15 MED ORDER — FUROSEMIDE 40 MG PO TABS
40.0000 mg | ORAL_TABLET | Freq: Every day | ORAL | 3 refills | Status: AC
  Filled 2024-01-15: qty 30, 30d supply, fill #0
  Filled 2024-02-18: qty 30, 30d supply, fill #1
  Filled 2024-04-03: qty 30, 30d supply, fill #2
  Filled 2024-04-23 – 2024-04-28 (×2): qty 30, 30d supply, fill #3

## 2024-01-15 MED ORDER — PANTOPRAZOLE SODIUM 40 MG PO TBEC
40.0000 mg | DELAYED_RELEASE_TABLET | Freq: Every day | ORAL | 0 refills | Status: DC
  Filled 2024-01-15 – 2024-04-03 (×2): qty 30, 30d supply, fill #0

## 2024-01-15 MED ORDER — JARDIANCE 25 MG PO TABS
25.0000 mg | ORAL_TABLET | Freq: Every day | ORAL | 0 refills | Status: AC
  Filled 2024-01-15: qty 30, 30d supply, fill #0

## 2024-01-15 NOTE — Addendum Note (Signed)
 Addended by: Gregory Dowe on: 01/15/2024 11:44 PM   Modules accepted: Level of Service

## 2024-01-16 ENCOUNTER — Inpatient Hospital Stay

## 2024-01-16 VITALS — BP 149/71 | HR 102 | Temp 97.8°F | Resp 20

## 2024-01-16 DIAGNOSIS — Z5111 Encounter for antineoplastic chemotherapy: Secondary | ICD-10-CM | POA: Diagnosis not present

## 2024-01-16 DIAGNOSIS — C189 Malignant neoplasm of colon, unspecified: Secondary | ICD-10-CM

## 2024-01-16 NOTE — Patient Instructions (Signed)
 [facility] [place] - A DEPT OF [place]  Discharge Instructions: Thank you for choosing [facility] to provide your oncology and hematology care.  If you have a lab appointment with the Cancer Center - please note that after April 8th, 2024, all labs will be drawn in the cancer center.  You do not have to check in or register with the main entrance as you have in the past but will complete your check-in in the cancer center.  Wear comfortable clothing and clothing appropriate for easy access to any Portacath or PICC line.   We strive to give you quality time with your provider. You may need to reschedule your appointment if you arrive late (15 or more minutes).  Arriving late affects you and other patients whose appointments are after yours.  Also, if you miss three or more appointments without notifying the office, you may be dismissed from the clinic at the provider's discretion.      For prescription refill requests, have your pharmacy contact our office and allow 72 hours for refills to be completed.    Today you received the following: disconnection of ambulatory chemo pump per protocol.   To help prevent nausea and vomiting after your treatment, we encourage you to take your nausea medication as directed.  BELOW ARE SYMPTOMS THAT SHOULD BE REPORTED IMMEDIATELY: *FEVER GREATER THAN 100.4 F (38 C) OR HIGHER *CHILLS OR SWEATING *NAUSEA AND VOMITING THAT IS NOT CONTROLLED WITH YOUR NAUSEA MEDICATION *UNUSUAL SHORTNESS OF BREATH *UNUSUAL BRUISING OR BLEEDING *URINARY PROBLEMS (pain or burning when urinating, or frequent urination) *BOWEL PROBLEMS (unusual diarrhea, constipation, pain near the anus) TENDERNESS IN MOUTH AND THROAT WITH OR WITHOUT PRESENCE OF ULCERS (sore throat, sores in mouth, or a toothache) UNUSUAL RASH, SWELLING OR PAIN  UNUSUAL VAGINAL DISCHARGE OR ITCHING   Items with * indicate a potential emergency and should be followed up  as soon as possible or go to the Emergency Department if any problems should occur.  Please show the CHEMOTHERAPY ALERT CARD or IMMUNOTHERAPY ALERT CARD at check-in to the Emergency Department and triage nurse.  Should you have questions after your visit or need to cancel or reschedule your appointment, please contact [facility]  - A DEPT OF [facility] [phone]  and follow the prompts.  Office hours are 8:00 a.m. to 4:30 p.m. Monday - Friday. Please note that voicemails left after 4:00 p.m. may not be returned until the following business day.  We are closed weekends and major holidays. You have access to a nurse at all times for urgent questions. Please call the main number to the clinic [phone] and follow the prompts.  For any non-urgent questions, you may also contact your provider using MyChart. We now offer e-Visits for anyone 38 and older to request care online for non-urgent symptoms. For details visit [email or web address].   Also download the MyChart app! Go to the app store, search MyChart, open the app, select , and log in with your MyChart username and password.

## 2024-01-16 NOTE — Progress Notes (Signed)
 Aaron Matthews. presents to have home infusion pump d/c'd and for port-a-cath deaccess with flush.  Portacath located right chest wall accessed with  H 20 needle.  Good blood return present. Portacath flushed with NS 20 ml  and needle removed intact.  Procedure tolerated well and without incident.

## 2024-01-21 ENCOUNTER — Inpatient Hospital Stay

## 2024-01-21 ENCOUNTER — Other Ambulatory Visit: Payer: Self-pay | Admitting: Oncology

## 2024-01-21 ENCOUNTER — Other Ambulatory Visit (HOSPITAL_BASED_OUTPATIENT_CLINIC_OR_DEPARTMENT_OTHER): Payer: Self-pay

## 2024-01-21 ENCOUNTER — Other Ambulatory Visit: Payer: Self-pay | Admitting: *Deleted

## 2024-01-21 ENCOUNTER — Encounter: Payer: Self-pay | Admitting: Oncology

## 2024-01-21 ENCOUNTER — Other Ambulatory Visit: Payer: Self-pay

## 2024-01-21 VITALS — BP 119/68 | HR 92 | Temp 97.7°F | Resp 20 | Wt 303.6 lb

## 2024-01-21 DIAGNOSIS — Z5111 Encounter for antineoplastic chemotherapy: Secondary | ICD-10-CM | POA: Diagnosis not present

## 2024-01-21 DIAGNOSIS — D500 Iron deficiency anemia secondary to blood loss (chronic): Secondary | ICD-10-CM

## 2024-01-21 MED ORDER — CETIRIZINE HCL 10 MG/ML IV SOLN: 10.0000 mg | Freq: Once | INTRAVENOUS | Status: DC

## 2024-01-21 MED ORDER — ALUM & MAG HYDROXIDE-SIMETH 200-200-20 MG/5ML PO SUSP
15.0000 mL | Freq: Four times a day (QID) | ORAL | 0 refills | Status: AC | PRN
  Filled 2024-01-21: qty 355, 6d supply, fill #0

## 2024-01-21 MED ORDER — ACETAMINOPHEN 325 MG PO TABS: 650.0000 mg | ORAL_TABLET | Freq: Once | ORAL | Status: DC

## 2024-01-21 MED ORDER — LIDOCAINE VISCOUS HCL 2 % MT SOLN
15.0000 mL | Freq: Four times a day (QID) | OROMUCOSAL | 2 refills | Status: AC | PRN
  Filled 2024-01-21: qty 200, 4d supply, fill #0

## 2024-01-21 MED ORDER — SODIUM CHLORIDE 0.9 % IV SOLN: Freq: Once | INTRAVENOUS | Status: AC

## 2024-01-21 MED ORDER — IRON SUCROSE 400 MG IVPB - SIMPLE MED
400.0000 mg | Freq: Once | Status: AC
  Administered 2024-01-21: 400 mg via INTRAVENOUS
  Filled 2024-01-21: qty 400

## 2024-01-21 NOTE — Telephone Encounter (Signed)
 Assessed mouth due to c/o sores developing over the weekend. Multiple mouth ulcers noted to upper aspect as well as inside upper lip.  Tongue wnl.  Per Protocol, sent lidocaine  2% to mix 1:1 with maalox and swish and spit. Patient verbalized understanding.  Dr. Davonna aware.

## 2024-01-21 NOTE — Patient Instructions (Addendum)

## 2024-01-21 NOTE — Progress Notes (Signed)
 Patient presents today for Venofer  400 iron  infusion. Patient has complaints of sores in his mouth that are painful and reddened. Triage RN T. Myers assessed. Patient unable to eat well without pain per patient's words.   Venofer  given today per MD orders. Tolerated infusion without adverse affects. Vital signs stable. No complaints at this time. Discharged from clinic ambulatory in stable condition. Alert and oriented x 3. F/U with [facility] as scheduled.

## 2024-01-22 ENCOUNTER — Other Ambulatory Visit (HOSPITAL_BASED_OUTPATIENT_CLINIC_OR_DEPARTMENT_OTHER): Payer: Self-pay

## 2024-01-23 ENCOUNTER — Encounter: Payer: Self-pay | Admitting: Oncology

## 2024-01-23 ENCOUNTER — Other Ambulatory Visit (HOSPITAL_BASED_OUTPATIENT_CLINIC_OR_DEPARTMENT_OTHER): Payer: Self-pay

## 2024-01-28 ENCOUNTER — Inpatient Hospital Stay: Admitting: Dietician

## 2024-01-28 ENCOUNTER — Inpatient Hospital Stay (HOSPITAL_BASED_OUTPATIENT_CLINIC_OR_DEPARTMENT_OTHER): Admitting: Oncology

## 2024-01-28 ENCOUNTER — Inpatient Hospital Stay

## 2024-01-28 ENCOUNTER — Inpatient Hospital Stay: Attending: Oncology

## 2024-01-28 DIAGNOSIS — R110 Nausea: Secondary | ICD-10-CM | POA: Insufficient documentation

## 2024-01-28 DIAGNOSIS — E119 Type 2 diabetes mellitus without complications: Secondary | ICD-10-CM | POA: Insufficient documentation

## 2024-01-28 DIAGNOSIS — D701 Agranulocytosis secondary to cancer chemotherapy: Secondary | ICD-10-CM | POA: Insufficient documentation

## 2024-01-28 DIAGNOSIS — Z5111 Encounter for antineoplastic chemotherapy: Secondary | ICD-10-CM | POA: Diagnosis present

## 2024-01-28 DIAGNOSIS — C787 Secondary malignant neoplasm of liver and intrahepatic bile duct: Secondary | ICD-10-CM

## 2024-01-28 DIAGNOSIS — K1231 Oral mucositis (ulcerative) due to antineoplastic therapy: Secondary | ICD-10-CM | POA: Insufficient documentation

## 2024-01-28 DIAGNOSIS — C180 Malignant neoplasm of cecum: Secondary | ICD-10-CM | POA: Diagnosis not present

## 2024-01-28 DIAGNOSIS — D500 Iron deficiency anemia secondary to blood loss (chronic): Secondary | ICD-10-CM | POA: Diagnosis not present

## 2024-01-28 DIAGNOSIS — Z5189 Encounter for other specified aftercare: Secondary | ICD-10-CM | POA: Insufficient documentation

## 2024-01-28 DIAGNOSIS — D509 Iron deficiency anemia, unspecified: Secondary | ICD-10-CM | POA: Diagnosis not present

## 2024-01-28 DIAGNOSIS — T451X5A Adverse effect of antineoplastic and immunosuppressive drugs, initial encounter: Secondary | ICD-10-CM | POA: Insufficient documentation

## 2024-01-28 DIAGNOSIS — G620 Drug-induced polyneuropathy: Secondary | ICD-10-CM | POA: Insufficient documentation

## 2024-01-28 DIAGNOSIS — C189 Malignant neoplasm of colon, unspecified: Secondary | ICD-10-CM

## 2024-01-28 LAB — CBC WITH DIFFERENTIAL/PLATELET
Abs Immature Granulocytes: 0 K/uL (ref 0.00–0.07)
Basophils Absolute: 0.1 K/uL (ref 0.0–0.1)
Basophils Relative: 2 %
Eosinophils Absolute: 0.1 K/uL (ref 0.0–0.5)
Eosinophils Relative: 3 %
HCT: 38.3 % — ABNORMAL LOW (ref 39.0–52.0)
Hemoglobin: 11.3 g/dL — ABNORMAL LOW (ref 13.0–17.0)
Immature Granulocytes: 0 %
Lymphocytes Relative: 42 %
Lymphs Abs: 1.1 K/uL (ref 0.7–4.0)
MCH: 23.7 pg — ABNORMAL LOW (ref 26.0–34.0)
MCHC: 29.5 g/dL — ABNORMAL LOW (ref 30.0–36.0)
MCV: 80.5 fL (ref 80.0–100.0)
Monocytes Absolute: 0.3 K/uL (ref 0.1–1.0)
Monocytes Relative: 10 %
Neutro Abs: 1.2 K/uL — ABNORMAL LOW (ref 1.7–7.7)
Neutrophils Relative %: 43 %
Platelets: 156 K/uL (ref 150–400)
RBC: 4.76 MIL/uL (ref 4.22–5.81)
RDW: 29 % — ABNORMAL HIGH (ref 11.5–15.5)
Smear Review: NORMAL
WBC: 2.7 K/uL — ABNORMAL LOW (ref 4.0–10.5)
nRBC: 0 % (ref 0.0–0.2)

## 2024-01-28 LAB — COMPREHENSIVE METABOLIC PANEL WITH GFR
ALT: 33 U/L (ref 0–44)
AST: 39 U/L (ref 15–41)
Albumin: 4.7 g/dL (ref 3.5–5.0)
Alkaline Phosphatase: 68 U/L (ref 38–126)
Anion gap: 15 (ref 5–15)
BUN: 14 mg/dL (ref 8–23)
CO2: 23 mmol/L (ref 22–32)
Calcium: 10 mg/dL (ref 8.9–10.3)
Chloride: 102 mmol/L (ref 98–111)
Creatinine, Ser: 1 mg/dL (ref 0.61–1.24)
GFR, Estimated: >60 mL/min (ref >60)
Glucose, Bld: 233 mg/dL — ABNORMAL HIGH (ref 70–99)
Potassium: 4.4 mmol/L (ref 3.5–5.1)
Sodium: 139 mmol/L (ref 135–145)
Total Bilirubin: 0.6 mg/dL (ref 0.0–1.2)
Total Protein: 7.4 g/dL (ref 6.5–8.1)

## 2024-01-28 LAB — MAGNESIUM: Magnesium: 2.2 mg/dL (ref 1.7–2.4)

## 2024-01-28 LAB — SAMPLE TO BLOOD BANK

## 2024-01-28 MED ORDER — OXALIPLATIN CHEMO INJECTION 100 MG/20ML
85.0000 mg/m2 | Freq: Once | INTRAVENOUS | Status: AC
  Administered 2024-01-28: 225 mg via INTRAVENOUS
  Filled 2024-01-28: qty 20

## 2024-01-28 MED ORDER — SODIUM CHLORIDE 0.9 % IV SOLN: INTRAVENOUS | Status: AC

## 2024-01-28 MED ORDER — SODIUM CHLORIDE 0.9 % IV SOLN
5.0000 mg/kg | Freq: Once | INTRAVENOUS | Status: AC
  Administered 2024-01-28: 700 mg via INTRAVENOUS
  Filled 2024-01-28: qty 16

## 2024-01-28 MED ORDER — SODIUM CHLORIDE 0.9 % IV SOLN
2400.0000 mg/m2 | INTRAVENOUS | Status: AC
  Administered 2024-01-28: 7000 mg via INTRAVENOUS
  Filled 2024-01-28: qty 140

## 2024-01-28 MED ORDER — DEXTROSE 5 % IV SOLN: INTRAVENOUS | Status: AC

## 2024-01-28 MED ORDER — DEXAMETHASONE SODIUM PHOSPHATE 10 MG/ML IJ SOLN
10.0000 mg | Freq: Once | INTRAMUSCULAR | Status: AC
  Administered 2024-01-28: 10 mg via INTRAVENOUS
  Filled 2024-01-28: qty 1

## 2024-01-28 MED ORDER — LEUCOVORIN CALCIUM INJECTION 350 MG
400.0000 mg/m2 | Freq: Once | INTRAVENOUS | Status: AC
  Administered 2024-01-28: 1076 mg via INTRAVENOUS
  Filled 2024-01-28 (×2): qty 53.8

## 2024-01-28 MED ORDER — FLUOROURACIL CHEMO INJECTION 2.5 GM/50ML
400.0000 mg/m2 | Freq: Once | INTRAVENOUS | Status: AC
  Administered 2024-01-28: 1000 mg via INTRAVENOUS
  Filled 2024-01-28: qty 20

## 2024-01-28 MED ORDER — PALONOSETRON HCL INJECTION 0.25 MG/5ML
0.2500 mg | Freq: Once | INTRAVENOUS | Status: AC
  Administered 2024-01-28: 0.25 mg via INTRAVENOUS
  Filled 2024-01-28: qty 5

## 2024-01-28 NOTE — Patient Instructions (Signed)
 [facility] [place] - A DEPT OF [place]  Discharge Instructions: Thank you for choosing [facility] to provide your oncology and hematology care.  If you have a lab appointment with the Cancer Center - please note that after April 8th, 2024, all labs will be drawn in the cancer center.  You do not have to check in or register with the main entrance as you have in the past but will complete your check-in in the cancer center.  Wear comfortable clothing and clothing appropriate for easy access to any Portacath or PICC line.   We strive to give you quality time with your provider. You may need to reschedule your appointment if you arrive late (15 or more minutes).  Arriving late affects you and other patients whose appointments are after yours.  Also, if you miss three or more appointments without notifying the office, you may be dismissed from the clinic at the provider's discretion.      For prescription refill requests, have your pharmacy contact our office and allow 72 hours for refills to be completed.    Today you received the following chemotherapy and/or immunotherapy agents mvasi , oxaliplatin , adruicil.       To help prevent nausea and vomiting after your treatment, we encourage you to take your nausea medication as directed.  BELOW ARE SYMPTOMS THAT SHOULD BE REPORTED IMMEDIATELY: *FEVER GREATER THAN 100.4 F (38 C) OR HIGHER *CHILLS OR SWEATING *NAUSEA AND VOMITING THAT IS NOT CONTROLLED WITH YOUR NAUSEA MEDICATION *UNUSUAL SHORTNESS OF BREATH *UNUSUAL BRUISING OR BLEEDING *URINARY PROBLEMS (pain or burning when urinating, or frequent urination) *BOWEL PROBLEMS (unusual diarrhea, constipation, pain near the anus) TENDERNESS IN MOUTH AND THROAT WITH OR WITHOUT PRESENCE OF ULCERS (sore throat, sores in mouth, or a toothache) UNUSUAL RASH, SWELLING OR PAIN  UNUSUAL VAGINAL DISCHARGE OR ITCHING   Items with * indicate a potential emergency and  should be followed up as soon as possible or go to the Emergency Department if any problems should occur.  Please show the CHEMOTHERAPY ALERT CARD or IMMUNOTHERAPY ALERT CARD at check-in to the Emergency Department and triage nurse.  Should you have questions after your visit or need to cancel or reschedule your appointment, please contact [facility] [place] - A DEPT OF [facility] [phone]  and follow the prompts.  Office hours are 8:00 a.m. to 4:30 p.m. Monday - Friday. Please note that voicemails left after 4:00 p.m. may not be returned until the following business day.  We are closed weekends and major holidays. You have access to a nurse at all times for urgent questions. Please call the main number to the clinic [phone] and follow the prompts.  For any non-urgent questions, you may also contact your provider using MyChart. We now offer e-Visits for anyone 27 and older to request care online for non-urgent symptoms. For details visit [email or web address].   Also download the MyChart app! Go to the app store, search MyChart, open the app, select [place], and log in with your MyChart username and password.

## 2024-01-28 NOTE — Progress Notes (Signed)
 Udenyca added to D3 pump d/c days per Dr. Davonna.  Willamina Grieshop, PharmD, MBA

## 2024-01-28 NOTE — Progress Notes (Signed)
 Patient has been examined by Dr. Davonna. Vital signs and labs have been reviewed by MD - ANC (1.2), Creatinine, LFTs, hemoglobin, and platelets have been reviewed by M.D. - pt may proceed with treatment.  Primary RN and pharmacy notified.

## 2024-01-28 NOTE — Addendum Note (Signed)
 Addended by: LEBRON MILLER B on: 01/28/2024 10:04 AM   Modules accepted: Orders

## 2024-01-28 NOTE — Progress Notes (Signed)
 Patient Care Team: Bertell Satterfield, MD as PCP - General (Internal Medicine) Davonna Siad, MD as Medical Oncologist (Medical Oncology) Celestia Joesph SQUIBB, RN as Oncology Nurse Navigator (Medical Oncology)  Clinic Day:  01/28/2024  Referring physician: Bertell Satterfield, MD   CHIEF COMPLAINT:  CC: Metastatic colon carcinoma   ASSESSMENT & PLAN:   Assessment & Plan: Aaron Matthews.  is a 63 y.o. male with metastatic colon carcinoma  Assessment and Plan Assessment & Plan Metastatic colorectal cancer Stage IV metastatic colon adenocarcinoma-metastatic to liver Oncology history below Biopsy-proven from the liver biopsy.  MMR proficient. [facility] NGS: KRAS exon 2 mutation present, PD-L1: 0, HER2 negative -12/31/2023: Started on FOLFOX +Avastin .  Added Avastin  with second cycle  - Patient tolerated treatment well.  No significant complaints noted. - Cycle 3-day 1 today. - Labs reviewed: CMP: Normal creatinine and normal LFTs, CBC: WBC: 2.7, hemoglobin: 11.3, platelets: 156 -Physical exam stable today.  Proceed with chemotherapy today.  -Will repeat CT CAP after 6 cycles of chemotherapy I.e 3 months - Will check CEA with every other cycle   Return to clinic prior to next cycle of chemotherapy  Metastasis to liver Biopsy-proven as below Radiation not an option.  Due to the extent of the disease surgical resection was not an option  - Chemotherapy as above  Chemotherapy-induced nausea Nausea effectively managed with Zofran .  - Continue preemptive use of Zofran  for nausea management.  Chemotherapy-induced oral mucositis Oral mucositis present, with relief from magic mouthwash.  - Continue use of magic mouthwash. - Monitor oral mucositis and consider reducing chemotherapy dose if symptoms worsen.  Chemotherapy-induced peripheral neuropathy Peripheral neuropathy symptoms present but improved from prior.  Leukopenia secondary to chemotherapy Leukopenia present but  expected due to chemotherapy. White blood cell count low but not prohibitive for continuing chemotherapy.  - Administer chemotherapy as planned. - Will add growth factors to day 3  Iron  deficiency anemia Iron  deficiency anemia. Hemoglobin levels improved significantly with IV iron  replacement  - Repeat iron  labs in a few weeks to assess treatment efficacy.  Type 2 diabetes mellitus, poorly controlled Diabetes poorly controlled. Blood sugar levels improved slightly.  - Emphasize dietary management to control blood sugar levels. - Monitor kidney function and protein levels in urine. -Continue to follow-up with primary care   The patient understands the plans discussed today and is in agreement with them.  He knows to contact our office if he develops concerns prior to his next appointment.  I provided 40 minutes of face-to-face time during this encounter and > 50% was spent counseling as documented under my assessment and plan.    Siad Davonna, MD  [facility] - A DEPT OF [facility] [address] [place]  [place] [address] Dept: [phone] Dept Fax: [phone]   Orders Placed This Encounter  Procedures   Iron  and TIBC ([facility] ONLY)    Standing Status:   Future    Expected Date:   02/25/2024    Expiration Date:   05/25/2024   Ferritin    Standing Status:   Future    Expected Date:   02/25/2024    Expiration Date:   05/25/2024   Vitamin B12    Standing Status:   Future    Expected Date:   02/25/2024    Expiration Date:   05/25/2024   Folate    Standing Status:   Future    Expected Date:   02/25/2024  Expiration Date:   05/25/2024     ONCOLOGY HISTORY:   Diagnosis: Metastatic colon adenocarcinoma   -11/28/2023: Cecal mass biopsy: Invasive moderately differentiated adenocarcinoma. -11/28/2023: Colonoscopy: Ulcerated tumor in the cecum.  Markedly redundant and elongated colon. -11/29/2023:  CT RJE:Uyzmz is a single ill-defined hypoattenuating 2.4 x 2.7 cm lesion in the right hepatic lobe, segment 5, which is incompletely characterized on the current exam. Further evaluation with nonemergent MRI abdomen as per liver mass protocol is recommended. There are multiple, sub 4 mm, solid, calcified and noncalcified nodules throughout bilateral lungs. Otherwise no metastatic disease identified within the chest, abdomen or pelvis. -11/30/2023: MRI liver:There are at least 5 hepatic lesions, compatible with metastases. The dominant lesion is in the right hepatic lobe, segment 5 measuring 2.9 x 3.2 cm. -12/11/2023: Liver, biopsy, right lobe, mass :       Metastatic adenocarcinoma with necrosis, consistent with colorectal primary  -Evaluated by radiation oncology for liver metastasis, radiation ordered option.  Discussed at tumor board and considering the extent of disease, surgery not an option for the patient. -12/28/2023: IR guided port placement - 12/31/2023-Current: FOLFOX + Bevacizumab .  Avastin  added at cycle 2 - 01/02/2024 [facility] NGS: KRAS exon 2 mutation: Positive, MSI-stable, MMR: Proficient, PD-L1: Negative, TPS:0%  - BRAF, NTRK 1/2/3, RET, EGFR, HER2, NF1, NRAS, PIK3CA,POLE: Negative  -Pathogenic variants: AMER 1, APC, AXIN1, KRAS, TP53  Current Treatment:  FOLFOX + Bevacizumab   INTERVAL HISTORY:  Discussed the use of AI scribe software for clinical note transcription with the patient, who gave verbal consent to proceed.  History of Present Illness Aaron Matthews. is a 63 year old male with colorectal cancer who presents for chemotherapy follow-up.  He has been feeling very good over the past week, with significant improvement since the previous week when he felt very sick on Thursday, Friday, and Saturday. He has been taking Zofran  preemptively, with two doses on Thursday, Friday, Saturday, and Sunday, and one dose on Monday, which has helped manage his nausea  effectively.  He is undergoing chemotherapy for colorectal cancer and has been receiving Avastin  as part of his treatment. His CEA cancer marker has been decreasing, from 111 initially to 87 at the last check.  He experiences numbness and tingling in his hands and feet, particularly during the treatment days (Monday to Wednesday) and worsening on Thursday and Friday. However, he notes improvement during the week off from treatment, allowing him to engage in activities like cooking and delivering meals to others.  He has a history of iron  deficiency anemia and recently received an iron  infusion. His hemoglobin level is currently 11.3, which is an improvement.   He reports a sore in his mouth, which he attributes to his dental plate moving and causing irritation. He has been using a prescribed magic mouthwash for relief.   I have reviewed the past medical history, past surgical history, social history and family history with the patient and they are unchanged from previous note.  ALLERGIES:  is allergic to insulin  glargine and penicillins.  MEDICATIONS:  Current Outpatient Medications  Medication Sig Dispense Refill   Alpha-Lipoic Acid 600 MG CAPS Take 1 capsule (600 mg total) by mouth 2 (two) times daily. 60 capsule 11   alum & mag hydroxide-simeth (MAALOX/MYLANTA) 200-200-20 MG/5ML suspension Take 15 mLs by mouth every 6 (six) hours as needed for indigestion (Mix 1:1 with lidocaine  and swish and spit). 355 mL 0   aspirin  EC 81 MG tablet Take 81 mg by mouth  daily. Swallow whole.     atorvastatin  (LIPITOR) 20 MG tablet Take 1 tablet (20 mg total) by mouth daily. 90 tablet 3   dexamethasone  (DECADRON ) 4 MG tablet Take 2 tablets (8 mg total) by mouth daily. Start the day after chemotherapy for 2 days. Take with food. 30 tablet 1   empagliflozin  (JARDIANCE ) 25 MG TABS tablet Take 1 tablet (25 mg total) by mouth daily. 90 tablet 3   enalapril  (VASOTEC ) 10 MG tablet Take 1 tablet (10 mg total) by  mouth 2 (two) times daily. 180 tablet 3   fexofenadine (ALLEGRA) 180 MG tablet Take 180 mg by mouth daily.     furosemide  (LASIX ) 40 MG tablet Take 1 tablet (40 mg total) by mouth daily. 30 tablet 3   gabapentin  (NEURONTIN ) 300 MG capsule Take 1 capsule (300 mg total) by mouth 4 (four) times daily. 90 capsule 0   gemfibrozil  (LOPID ) 600 MG tablet Take 1 tablet (600 mg total) by mouth 2 (two) times daily. 180 tablet 3   HUMALOG KWIKPEN 100 UNIT/ML KiwkPen Inject 33 Units into the muscle 3 (three) times daily.     insulin  degludec (TRESIBA ) 100 UNIT/ML FlexTouch Pen Inject 50 Units into the skin 2 (two) times daily.     insulin  degludec (TRESIBA ) 100 UNIT/ML FlexTouch Pen Inject 100 Units into the skin 2 (two) times daily. 90 mL 3   JARDIANCE  25 MG TABS tablet Take 1 tablet (25 mg total) by mouth daily. 30 tablet 0   lidocaine  (XYLOCAINE ) 2 % solution Use as directed 15 mLs in the mouth or throat every 6 (six) hours as needed for mouth pain (Mix 1:1 with Maalox and Swish and spit). 200 mL 2   lidocaine -prilocaine  (EMLA ) cream Apply 1 Application topically as needed (Apply prior to port access). 30 g 3   meclizine  (ANTIVERT ) 25 MG tablet Take 1 tablet (25 mg total) by mouth daily as needed for dizziness. 30 tablet 0   metFORMIN  (GLUCOPHAGE -XR) 750 MG 24 hr tablet Take 1 tablet (750 mg total) by mouth daily with breakfast. 30 tablet 0   MOUNJARO  2.5 MG/0.5ML Pen Inject 2.5 mg into the skin once a week.     ondansetron  (ZOFRAN ) 8 MG tablet Take 1 tablet (8 mg total) by mouth every 8 (eight) hours as needed for nausea or vomiting. Start on the third day after chemotherapy. 30 tablet 1   pantoprazole  (PROTONIX ) 40 MG tablet Take 1 tablet (40 mg total) by mouth daily. 30 tablet 0   prochlorperazine  (COMPAZINE ) 10 MG tablet Take 1 tablet (10 mg total) by mouth every 6 (six) hours as needed for nausea or vomiting. 30 tablet 1   tirzepatide  (MOUNJARO ) 2.5 MG/0.5ML Pen Inject 2.5 mg into the skin once a week. 4  mL 1   No current facility-administered medications for this visit.   Facility-Administered Medications Ordered in Other Visits  Medication Dose Route Frequency Provider Last Rate Last Admin   0.9 %  sodium chloride  infusion   Intravenous Continuous Nasteho Glantz, MD 10 mL/hr at 01/28/24 1009 New Bag at 01/28/24 1009   dextrose  5 % solution   Intravenous Continuous Autumne Kallio, MD 10 mL/hr at 01/28/24 1113 New Bag at 01/28/24 1113   fluorouracil  (ADRUCIL ) 7,000 mg in sodium chloride  0.9 % 110 mL chemo infusion  2,400 mg/m2 (Treatment Plan Recorded) Intravenous 1 day or 1 dose Kerston Landeck, MD   7,000 mg at 01/28/24 1354    REVIEW OF SYSTEMS:   Constitutional: Denies fevers,  chills or abnormal weight loss Eyes: Denies blurriness of vision Ears, nose, mouth, throat, and face: Denies mucositis or sore throat Respiratory: Denies cough, dyspnea or wheezes Cardiovascular: Denies palpitation, chest discomfort or lower extremity swelling Gastrointestinal:  Denies nausea, heartburn or change in bowel habits Skin: Denies abnormal skin rashes Lymphatics: Denies new lymphadenopathy or easy bruising Neurological:Denies numbness, tingling or new weaknesses Behavioral/Psych: Mood is stable, no new changes  All other systems were reviewed with the patient and are negative.   VITALS:  There were no vitals taken for this visit.  Wt Readings from Last 3 Encounters:  01/28/24 (!) 308 lb 9.6 oz (140 kg)  01/21/24 (!) 303 lb 9.6 oz (137.7 kg)  01/14/24 (!) 303 lb 2.1 oz (137.5 kg)    There is no height or weight on file to calculate BMI.  Performance status (ECOG): 1 - Symptomatic but completely ambulatory  PHYSICAL EXAM:   GENERAL:alert, no distress and comfortable SKIN: skin color, texture, turgor are normal, no rashes or significant lesions HEENT: No evidence of mucositis LYMPH:  no palpable lymphadenopathy in the cervical, axillary or inguinal LUNGS: clear to auscultation and  percussion with normal breathing effort HEART: regular rate & rhythm and no murmurs and no lower extremity edema ABDOMEN:abdomen soft, non-tender and normal bowel sounds Musculoskeletal:no cyanosis of digits and no clubbing  NEURO: alert & oriented x 3 with fluent speech, no focal motor/sensory deficits  LABORATORY DATA:  I have reviewed the data as listed    Component Value Date/Time   NA 139 01/28/2024 0834   K 4.4 01/28/2024 0834   CL 102 01/28/2024 0834   CO2 23 01/28/2024 0834   GLUCOSE 233 (H) 01/28/2024 0834   BUN 14 01/28/2024 0834   CREATININE 1.00 01/28/2024 0834   CALCIUM  10.0 01/28/2024 0834   PROT 7.4 01/28/2024 0834   ALBUMIN 4.7 01/28/2024 0834   AST 39 01/28/2024 0834   ALT 33 01/28/2024 0834   ALKPHOS 68 01/28/2024 0834   BILITOT 0.6 01/28/2024 0834   GFRNONAA >60 01/28/2024 0834   GFRAA >60 04/16/2016 1019    Lab Results  Component Value Date   WBC 2.7 (L) 01/28/2024   NEUTROABS 1.2 (L) 01/28/2024   HGB 11.3 (L) 01/28/2024   HCT 38.3 (L) 01/28/2024   MCV 80.5 01/28/2024   PLT 156 01/28/2024      Chemistry      Component Value Date/Time   NA 139 01/28/2024 0834   K 4.4 01/28/2024 0834   CL 102 01/28/2024 0834   CO2 23 01/28/2024 0834   BUN 14 01/28/2024 0834   CREATININE 1.00 01/28/2024 0834      Component Value Date/Time   CALCIUM  10.0 01/28/2024 0834   ALKPHOS 68 01/28/2024 0834   AST 39 01/28/2024 0834   ALT 33 01/28/2024 0834   BILITOT 0.6 01/28/2024 0834      Latest Reference Range & Units 12/19/23 12:02  Iron  45 - 182 ug/dL 11 (L)  UIBC ug/dL 368  TIBC 749 - 549 ug/dL 357 (H)  Saturation Ratios 17.9 - 39.5 % 2 (L)  Ferritin 24 - 336 ng/mL 3 (L)  (L): Data is abnormally low (H): Data is abnormally high   Latest Reference Range & Units 01/14/24 10:04  CEA 0.0 - 4.7 ng/mL 87.2 (H)  (H): Data is abnormally high  RADIOGRAPHIC STUDIES: I have personally reviewed the radiological images as listed and agreed with the findings in the  report.  None new to review

## 2024-01-28 NOTE — Progress Notes (Signed)
 Nutrition Follow-up:  Pt with colon cancer metastatic to liver. He is receiving palliative chemotherapy with Folfox + Beva q14d as well as IV iron  as needed.   Met with patient in infusion. He is doing well today. Reports feeling great the last 5 days. His appetite has been good. Tolerating a variety of foods (pork chops, ribs, roast, pizza, pasta). He has a mouth sore spot on bottom gum. This is healing, but unable to wear lower partial. Patient reports nausea well controlled last cycle with antiemetics twice daily x 3 days. Denies constipation or diarrhea.    Medications: gabapentin , jardiance , lidocaine , metformin , protonix   Labs: glucose 233  Anthropometrics: Wt 308 lb 9.6 oz today - increased   9/29 - 303 lb 9.6 oz 9/17 - 306 lb 3.5 oz  8/27 - 313 lb 11.4 oz    NUTRITION DIAGNOSIS: Food and nutrition related knowledge deficit improving    INTERVENTION:  Continue including protein foods at every meal Antiemetics for nausea as needed Encourage flavoring water  to help with hydration     MONITORING, EVALUATION, GOAL: wt trends, intake   NEXT VISIT: Monday November 3 during infusion

## 2024-01-28 NOTE — Progress Notes (Signed)
 Patient tolerated chemotherapy with no complaints voiced.  Side effects with management reviewed with understanding verbalized.  Port site clean and dry with no bruising or swelling noted at site.  Good blood return noted before and after administration of chemotherapy.  Chemo pump applied with no alarms noted.   Patient left in satisfactory condition with VSS and no s/s of distress noted.

## 2024-01-28 NOTE — Patient Instructions (Signed)

## 2024-01-30 ENCOUNTER — Inpatient Hospital Stay

## 2024-01-30 VITALS — BP 149/81 | HR 89 | Temp 98.0°F | Resp 20

## 2024-01-30 DIAGNOSIS — Z5111 Encounter for antineoplastic chemotherapy: Secondary | ICD-10-CM | POA: Diagnosis not present

## 2024-01-30 DIAGNOSIS — C189 Malignant neoplasm of colon, unspecified: Secondary | ICD-10-CM

## 2024-01-30 MED ORDER — PEGFILGRASTIM-CBQV 6 MG/0.6ML ~~LOC~~ SOSY
6.0000 mg | PREFILLED_SYRINGE | Freq: Once | SUBCUTANEOUS | Status: AC
  Administered 2024-01-30: 6 mg via SUBCUTANEOUS
  Filled 2024-01-30: qty 0.6

## 2024-01-30 NOTE — Progress Notes (Signed)
 Patient presents today for pump d/c. Vital signs are stable. Port a cath site clean, dry, and intact. Port flushed with 20 mls of normal saline. Needle removed intact. Band aid applied. Patient has no complaints at this time. Discharged from clinic by wheel chair and in stable condition. Patient alert and oriented.

## 2024-01-30 NOTE — Patient Instructions (Signed)
 [facility] [place] - A DEPT OF [facility]  Discharge Instructions: Thank you for choosing [facility] to provide your oncology and hematology care.  If you have a lab appointment with the Cancer Center - please note that after April 8th, 2024, all labs will be drawn in the cancer center.  You do not have to check in or register with the main entrance as you have in the past but will complete your check-in in the cancer center.  Wear comfortable clothing and clothing appropriate for easy access to any Portacath or PICC line.   We strive to give you quality time with your provider. You may need to reschedule your appointment if you arrive late (15 or more minutes).  Arriving late affects you and other patients whose appointments are after yours.  Also, if you miss three or more appointments without notifying the office, you may be dismissed from the clinic at the provider's discretion.      For prescription refill requests, have your pharmacy contact our office and allow 72 hours for refills to be completed.    Today you received the following chemotherapy and/or immunotherapy agents Adrucil. Fluorouracil Injection What is this medication? FLUOROURACIL (flure oh YOOR a sil) treats some types of cancer. It works by slowing down the growth of cancer cells. This medicine may be used for other purposes; ask your health care provider or pharmacist if you have questions. COMMON BRAND NAME(S): Adrucil What should I tell my care team before I take this medication? They need to know if you have any of these conditions: Blood disorders Dihydropyrimidine dehydrogenase (DPD) deficiency Infection, such as chickenpox, cold sores, herpes Kidney disease Liver disease Poor nutrition Recent or ongoing radiation therapy An unusual or allergic reaction to fluorouracil, other medications, foods, dyes, or preservatives If you or your partner are pregnant or trying to get  pregnant Breast-feeding How should I use this medication? This medication is injected into a vein. It is administered by your care team in a hospital or clinic setting. Talk to your care team about the use of this medication in children. Special care may be needed. Overdosage: If you think you have taken too much of this medicine contact a poison control center or emergency room at once. NOTE: This medicine is only for you. Do not share this medicine with others. What if I miss a dose? Keep appointments for follow-up doses. It is important not to miss your dose. Call your care team if you are unable to keep an appointment. What may interact with this medication? Do not take this medication with any of the following: Live virus vaccines This medication may also interact with the following: Medications that treat or prevent blood clots, such as warfarin, enoxaparin, dalteparin This list may not describe all possible interactions. Give your health care provider a list of all the medicines, herbs, non-prescription drugs, or dietary supplements you use. Also tell them if you smoke, drink alcohol, or use illegal drugs. Some items may interact with your medicine. What should I watch for while using this medication? Your condition will be monitored carefully while you are receiving this medication. This medication may make you feel generally unwell. This is not uncommon as chemotherapy can affect healthy cells as well as cancer cells. Report any side effects. Continue your course of treatment even though you feel ill unless your care team tells you to stop. In some cases, you may be given additional  medications to help with side effects. Follow all directions for their use. This medication may increase your risk of getting an infection. Call your care team for advice if you get a fever, chills, sore throat, or other symptoms of a cold or flu. Do not treat yourself. Try to avoid being around people who are  sick. This medication may increase your risk to bruise or bleed. Call your care team if you notice any unusual bleeding. Be careful brushing or flossing your teeth or using a toothpick because you may get an infection or bleed more easily. If you have any dental work done, tell your dentist you are receiving this medication. Avoid taking medications that contain aspirin, acetaminophen, ibuprofen, naproxen, or ketoprofen unless instructed by your care team. These medications may hide a fever. Do not treat diarrhea with over the counter products. Contact your care team if you have diarrhea that lasts more than 2 days or if it is severe and watery. This medication can make you more sensitive to the sun. Keep out of the sun. If you cannot avoid being in the sun, wear protective clothing and sunscreen. Do not use sun lamps, tanning beds, or tanning booths. Talk to your care team if you or your partner wish to become pregnant or think you might be pregnant. This medication can cause serious birth defects if taken during pregnancy and for 3 months after the last dose. A reliable form of contraception is recommended while taking this medication and for 3 months after the last dose. Talk to your care team about effective forms of contraception. Do not father a child while taking this medication and for 3 months after the last dose. Use a condom while having sex during this time period. Do not breastfeed while taking this medication. This medication may cause infertility. Talk to your care team if you are concerned about your fertility. What side effects may I notice from receiving this medication? Side effects that you should report to your care team as soon as possible: Allergic reactions--skin rash, itching, hives, swelling of the face, lips, tongue, or throat Heart attack--pain or tightness in the chest, shoulders, arms, or jaw, nausea, shortness of breath, cold or clammy skin, feeling faint or  lightheaded Heart failure--shortness of breath, swelling of the ankles, feet, or hands, sudden weight gain, unusual weakness or fatigue Heart rhythm changes--fast or irregular heartbeat, dizziness, feeling faint or lightheaded, chest pain, trouble breathing High ammonia level--unusual weakness or fatigue, confusion, loss of appetite, nausea, vomiting, seizures Infection--fever, chills, cough, sore throat, wounds that don't heal, pain or trouble when passing urine, general feeling of discomfort or being unwell Low red blood cell level--unusual weakness or fatigue, dizziness, headache, trouble breathing Pain, tingling, or numbness in the hands or feet, muscle weakness, change in vision, confusion or trouble speaking, loss of balance or coordination, trouble walking, seizures Redness, swelling, and blistering of the skin over hands and feet Severe or prolonged diarrhea Unusual bruising or bleeding Side effects that usually do not require medical attention (report to your care team if they continue or are bothersome): Dry skin Headache Increased tears Nausea Pain, redness, or swelling with sores inside the mouth or throat Sensitivity to light Vomiting This list may not describe all possible side effects. Call your doctor for medical advice about side effects. You may report side effects to FDA at 1-800-FDA-1088. Where should I keep my medication? This medication is given in a hospital or clinic. It will not be stored at home.  NOTE: This sheet is a summary. It may not cover all possible information. If you have questions about this medicine, talk to your doctor, pharmacist, or health care provider.  2024 Elsevier/Gold Standard (2021-08-16 00:00:00)      To help prevent nausea and vomiting after your treatment, we encourage you to take your nausea medication as directed.  BELOW ARE SYMPTOMS THAT SHOULD BE REPORTED IMMEDIATELY: *FEVER GREATER THAN 100.4 F (38 C) OR HIGHER *CHILLS OR  SWEATING *NAUSEA AND VOMITING THAT IS NOT CONTROLLED WITH YOUR NAUSEA MEDICATION *UNUSUAL SHORTNESS OF BREATH *UNUSUAL BRUISING OR BLEEDING *URINARY PROBLEMS (pain or burning when urinating, or frequent urination) *BOWEL PROBLEMS (unusual diarrhea, constipation, pain near the anus) TENDERNESS IN MOUTH AND THROAT WITH OR WITHOUT PRESENCE OF ULCERS (sore throat, sores in mouth, or a toothache) UNUSUAL RASH, SWELLING OR PAIN  UNUSUAL VAGINAL DISCHARGE OR ITCHING   Items with * indicate a potential emergency and should be followed up as soon as possible or go to the Emergency Department if any problems should occur.  Please show the CHEMOTHERAPY ALERT CARD or IMMUNOTHERAPY ALERT CARD at check-in to the Emergency Department and triage nurse.  Should you have questions after your visit or need to cancel or reschedule your appointment, please contact [facility] [place] - A DEPT OF Eligha Bridegroom [facility] [phone]  and follow the prompts.  Office hours are 8:00 a.m. to 4:30 p.m. Monday - Friday. Please note that voicemails left after 4:00 p.m. may not be returned until the following business day.  We are closed weekends and major holidays. You have access to a nurse at all times for urgent questions. Please call the main number to the clinic [phone] and follow the prompts.  For any non-urgent questions, you may also contact your provider using MyChart. We now offer e-Visits for anyone 74 and older to request care online for non-urgent symptoms. For details visit [email or web address].   Also download the MyChart app! Go to the app store, search "MyChart", open the app, select [place], and log in with your MyChart username and password.

## 2024-02-01 ENCOUNTER — Other Ambulatory Visit (HOSPITAL_BASED_OUTPATIENT_CLINIC_OR_DEPARTMENT_OTHER): Payer: Self-pay

## 2024-02-05 ENCOUNTER — Other Ambulatory Visit: Payer: Self-pay | Admitting: Oncology

## 2024-02-05 ENCOUNTER — Other Ambulatory Visit (HOSPITAL_BASED_OUTPATIENT_CLINIC_OR_DEPARTMENT_OTHER): Payer: Self-pay

## 2024-02-05 MED ORDER — GABAPENTIN 300 MG PO CAPS
300.0000 mg | ORAL_CAPSULE | Freq: Four times a day (QID) | ORAL | 0 refills | Status: DC
  Filled 2024-02-05: qty 90, 23d supply, fill #0

## 2024-02-08 ENCOUNTER — Encounter: Payer: Self-pay | Admitting: Oncology

## 2024-02-11 ENCOUNTER — Inpatient Hospital Stay: Admitting: Oncology

## 2024-02-11 ENCOUNTER — Inpatient Hospital Stay

## 2024-02-11 VITALS — BP 130/84 | HR 93 | Temp 98.1°F | Resp 18 | Wt 305.6 lb

## 2024-02-11 DIAGNOSIS — C189 Malignant neoplasm of colon, unspecified: Secondary | ICD-10-CM

## 2024-02-11 DIAGNOSIS — Z5111 Encounter for antineoplastic chemotherapy: Secondary | ICD-10-CM | POA: Diagnosis not present

## 2024-02-11 LAB — COMPREHENSIVE METABOLIC PANEL WITH GFR
ALT: 28 U/L (ref 0–44)
AST: 33 U/L (ref 15–41)
Albumin: 4.9 g/dL (ref 3.5–5.0)
Alkaline Phosphatase: 76 U/L (ref 38–126)
Anion gap: 16 — ABNORMAL HIGH (ref 5–15)
BUN: 18 mg/dL (ref 8–23)
CO2: 25 mmol/L (ref 22–32)
Calcium: 10.1 mg/dL (ref 8.9–10.3)
Chloride: 96 mmol/L — ABNORMAL LOW (ref 98–111)
Creatinine, Ser: 1.04 mg/dL (ref 0.61–1.24)
GFR, Estimated: >60 mL/min (ref >60)
Glucose, Bld: 208 mg/dL — ABNORMAL HIGH (ref 70–99)
Potassium: 3.8 mmol/L (ref 3.5–5.1)
Sodium: 137 mmol/L (ref 135–145)
Total Bilirubin: 0.5 mg/dL (ref 0.0–1.2)
Total Protein: 7.8 g/dL (ref 6.5–8.1)

## 2024-02-11 LAB — CBC WITH DIFFERENTIAL/PLATELET
Abs Immature Granulocytes: 0.01 K/uL (ref 0.00–0.07)
Basophils Absolute: 0.1 K/uL (ref 0.0–0.1)
Basophils Relative: 2 %
Eosinophils Absolute: 0.1 K/uL (ref 0.0–0.5)
Eosinophils Relative: 3 %
HCT: 41.7 % (ref 39.0–52.0)
Hemoglobin: 12.9 g/dL — ABNORMAL LOW (ref 13.0–17.0)
Immature Granulocytes: 0 %
Lymphocytes Relative: 52 %
Lymphs Abs: 1.6 K/uL (ref 0.7–4.0)
MCH: 25.3 pg — ABNORMAL LOW (ref 26.0–34.0)
MCHC: 30.9 g/dL (ref 30.0–36.0)
MCV: 81.9 fL (ref 80.0–100.0)
Monocytes Absolute: 0.3 K/uL (ref 0.1–1.0)
Monocytes Relative: 10 %
Neutro Abs: 1 K/uL — ABNORMAL LOW (ref 1.7–7.7)
Neutrophils Relative %: 33 %
Platelets: 153 K/uL (ref 150–400)
RBC: 5.09 MIL/uL (ref 4.22–5.81)
RDW: 27.1 % — ABNORMAL HIGH (ref 11.5–15.5)
WBC: 3.1 K/uL — ABNORMAL LOW (ref 4.0–10.5)
nRBC: 0 % (ref 0.0–0.2)

## 2024-02-11 LAB — MAGNESIUM: Magnesium: 2.1 mg/dL (ref 1.7–2.4)

## 2024-02-11 LAB — URINALYSIS, DIPSTICK ONLY
Bilirubin Urine: NEGATIVE
Glucose, UA: >=500 mg/dL — AB
Hgb urine dipstick: NEGATIVE
Ketones, ur: NEGATIVE mg/dL
Nitrite: NEGATIVE
Protein, ur: NEGATIVE mg/dL
Specific Gravity, Urine: 1.011 (ref 1.005–1.030)
pH: 5 (ref 5.0–8.0)

## 2024-02-11 MED ORDER — FLUOROURACIL CHEMO INJECTION 2.5 GM/50ML
400.0000 mg/m2 | Freq: Once | INTRAVENOUS | Status: AC
  Administered 2024-02-11: 1000 mg via INTRAVENOUS
  Filled 2024-02-11: qty 20

## 2024-02-11 MED ORDER — SODIUM CHLORIDE 0.9 % IV SOLN
5.0000 mg/kg | Freq: Once | INTRAVENOUS | Status: AC
  Administered 2024-02-11: 700 mg via INTRAVENOUS
  Filled 2024-02-11: qty 16

## 2024-02-11 MED ORDER — SODIUM CHLORIDE 0.9 % IV SOLN
2400.0000 mg/m2 | INTRAVENOUS | Status: DC
  Administered 2024-02-11: 7000 mg via INTRAVENOUS
  Filled 2024-02-11: qty 140

## 2024-02-11 MED ORDER — OXALIPLATIN CHEMO INJECTION 100 MG/20ML
85.0000 mg/m2 | Freq: Once | INTRAVENOUS | Status: AC
  Administered 2024-02-11: 225 mg via INTRAVENOUS
  Filled 2024-02-11: qty 40

## 2024-02-11 MED ORDER — DEXTROSE 5 % IV SOLN: INTRAVENOUS | Status: DC

## 2024-02-11 MED ORDER — PALONOSETRON HCL INJECTION 0.25 MG/5ML
0.2500 mg | Freq: Once | INTRAVENOUS | Status: AC
  Administered 2024-02-11: 0.25 mg via INTRAVENOUS
  Filled 2024-02-11: qty 5

## 2024-02-11 MED ORDER — LEUCOVORIN CALCIUM INJECTION 350 MG
400.0000 mg/m2 | Freq: Once | INTRAVENOUS | Status: AC
  Administered 2024-02-11: 1076 mg via INTRAVENOUS
  Filled 2024-02-11: qty 53.8

## 2024-02-11 MED ORDER — SODIUM CHLORIDE 0.9 % IV SOLN: INTRAVENOUS | Status: DC

## 2024-02-11 MED ORDER — DEXAMETHASONE SOD PHOSPHATE PF 10 MG/ML IJ SOLN
10.0000 mg | Freq: Once | INTRAMUSCULAR | Status: AC
  Administered 2024-02-11: 10 mg via INTRAVENOUS
  Filled 2024-02-11: qty 1

## 2024-02-11 MED ORDER — INSULIN ASPART 100 UNIT/ML IJ SOLN
10.0000 [IU] | Freq: Once | INTRAMUSCULAR | Status: AC
  Administered 2024-02-11: 10 [IU] via SUBCUTANEOUS
  Filled 2024-02-11: qty 1

## 2024-02-11 NOTE — Progress Notes (Signed)
 Patient presents today for MVASI /Folfox infusion with 5FU pump start.  ANC noted to be 1.0, MD notified.  Message received from Isaiah Piety RN/Dr. Davonna okay to proceed with treatment.  Patient will get 10 Units of Novolog  subcutaneously today per MD standing order for blood glucose of 208.  Treatment given today per MD orders.  Tolerated infusion without adverse affects.  Vital signs stable.  5FU pump connected and verified RUN on the screen with the patient.  No complaints at this time.  Discharge from clinic ambulatory in stable condition.  Alert and oriented X 3.  Follow up with [facility] as scheduled.

## 2024-02-11 NOTE — Patient Instructions (Signed)
 [facility] [place] - A DEPT OF [facility]  Discharge Instructions: Thank you for choosing [facility] to provide your oncology and hematology care.  If you have a lab appointment with the Cancer Center - please note that after April 8th, 2024, all labs will be drawn in the cancer center.  You do not have to check in or register with the main entrance as you have in the past but will complete your check-in in the cancer center.  Wear comfortable clothing and clothing appropriate for easy access to any Portacath or PICC line.   We strive to give you quality time with your provider. You may need to reschedule your appointment if you arrive late (15 or more minutes).  Arriving late affects you and other patients whose appointments are after yours.  Also, if you miss three or more appointments without notifying the office, you may be dismissed from the clinic at the provider's discretion.      For prescription refill requests, have your pharmacy contact our office and allow 72 hours for refills to be completed.    Today you received the following chemotherapy and/or immunotherapy agents MVASI  Folfox 5FU pump start      To help prevent nausea and vomiting after your treatment, we encourage you to take your nausea medication as directed.  BELOW ARE SYMPTOMS THAT SHOULD BE REPORTED IMMEDIATELY: *FEVER GREATER THAN 100.4 F (38 C) OR HIGHER *CHILLS OR SWEATING *NAUSEA AND VOMITING THAT IS NOT CONTROLLED WITH YOUR NAUSEA MEDICATION *UNUSUAL SHORTNESS OF BREATH *UNUSUAL BRUISING OR BLEEDING *URINARY PROBLEMS (pain or burning when urinating, or frequent urination) *BOWEL PROBLEMS (unusual diarrhea, constipation, pain near the anus) TENDERNESS IN MOUTH AND THROAT WITH OR WITHOUT PRESENCE OF ULCERS (sore throat, sores in mouth, or a toothache) UNUSUAL RASH, SWELLING OR PAIN  UNUSUAL VAGINAL DISCHARGE OR ITCHING   Items with * indicate a potential emergency and  should be followed up as soon as possible or go to the Emergency Department if any problems should occur.  Please show the CHEMOTHERAPY ALERT CARD or IMMUNOTHERAPY ALERT CARD at check-in to the Emergency Department and triage nurse.  Should you have questions after your visit or need to cancel or reschedule your appointment, please contact [facility]CER CTR [place] - A DEPT OF [facility] [phone]  and follow the prompts.  Office hours are 8:00 a.m. to 4:30 p.m. Monday - Friday. Please note that voicemails left after 4:00 p.m. may not be returned until the following business day.  We are closed weekends and major holidays. You have access to a nurse at all times for urgent questions. Please call the main number to the clinic [phone] and follow the prompts.  For any non-urgent questions, you may also contact your provider using MyChart. We now offer e-Visits for anyone 64 and older to request care online for non-urgent symptoms. For details visit [email or web address].   Also download the MyChart app! Go to the app store, search MyChart, open the app, select , and log in with your MyChart username and password.

## 2024-02-11 NOTE — Progress Notes (Signed)
 SPIRITUAL CARE AND COUNSELING CONSULT NOTE   VISIT SUMMARY:  Reason for Visit: Chaplain identified Pt on the schedule as a Pt I had not connected with yet and visited to deliver introduction to Spiritual Care  Description of Visit: Upon arrival I found Aaron Matthews seated in the recliner receiving treatment, and his daughter, [name], was there as a support person. [name] has been at all of his treatments and appointments, according to Pt.  I introduced myself as the chaplain for the cancer center and offered a brief education on the role of a chaplain and the support we can offer to our patients, caregivers, and staff.     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   In sharing his story he spoke of losing his father and there was a deep connection there.  It's a subject to explore deeper in future visits.   [name] and his family are currently searching for a new church home as their previous church of 30 years has a new pastor that have taken issue with. He describes finding support and welcome in several places.  Plan of Care: I will continue to follow up with Elza on a monthly basis.   SPIRITUAL ENCOUNTER                                                                                                                                                                      Type of Visit: Initial Care provided to:: Patient, Family (Daughter Primary school teacher) Psychologist, sport and exercise partners present during encounter: Nurse Referral source: Chief Strategy Officer Reason for visit:  (Intorduction to Spiritual Care) OnCall Visit: No   SPIRITUAL FRAMEWORK  Presenting Themes: Goals in life/care, Values and beliefs, Impactful experiences and emotions, Caregiving needs, Community and relationships Values/beliefs: [name] is a strong and mature Christian who values his faith and the support of the church.  He also places a high priorotiy on  family Community/Connection: Family, Friend(s), Designer, multimedia, Faith community Strengths: Maturity, Wisdom, Strong supportive relationships Patient Stress Factors: None identified   GOALS   Self/Personal Goals: Continue to thrive and share his faith Additional Comment(s): Concerned about how chemo will impact his already exisiting neuropathy   INTERVENTIONS   Spiritual Care Interventions Made: Established relationship of care and support, Reflective listening, Narrative/life review, Prayer    INTERVENTION OUTCOMES   Outcomes: Awareness around self/spiritual resourses, Awareness of support  SPIRITUAL CARE PLAN   Spiritual Care Issues Still Outstanding: No further spiritual care needs at this time (see row info)     Maude Roll, MDiv Chaplain, [facility] [email or web address] [phone]  02/11/2024 10:53 AM

## 2024-02-12 LAB — CEA: CEA: 25.5 ng/mL — ABNORMAL HIGH (ref 0.0–4.7)

## 2024-02-13 ENCOUNTER — Inpatient Hospital Stay

## 2024-02-13 VITALS — BP 120/77 | HR 90 | Temp 98.7°F | Resp 19

## 2024-02-13 DIAGNOSIS — C189 Malignant neoplasm of colon, unspecified: Secondary | ICD-10-CM

## 2024-02-13 DIAGNOSIS — Z5111 Encounter for antineoplastic chemotherapy: Secondary | ICD-10-CM | POA: Diagnosis not present

## 2024-02-13 MED ORDER — PEGFILGRASTIM-CBQV 6 MG/0.6ML ~~LOC~~ SOSY
6.0000 mg | PREFILLED_SYRINGE | Freq: Once | SUBCUTANEOUS | Status: AC
  Administered 2024-02-13: 6 mg via SUBCUTANEOUS
  Filled 2024-02-13: qty 0.6

## 2024-02-13 NOTE — Progress Notes (Signed)
 Aaron Matthews. presents to have home infusion pump d/c'd and for port-a-cath deaccess with flush.  Portacath located right chest wall accessed with  H 20 needle.  Good blood return present. Portacath flushed with NS and needle removed intact.  Procedure tolerated well and without incident.   Udenyca injection given per orders. Patient tolerated it well without problems. Vitals stable and discharged home from clinic ambulatory. Follow up as scheduled.

## 2024-02-13 NOTE — Patient Instructions (Signed)
 [facility] [place] - A DEPT OF [facility]  Discharge Instructions: Thank you for choosing [facility] to provide your oncology and hematology care.  If you have a lab appointment with the Cancer Center - please note that after April 8th, 2024, all labs will be drawn in the cancer center.  You do not have to check in or register with the main entrance as you have in the past but will complete your check-in in the cancer center.  Wear comfortable clothing and clothing appropriate for easy access to any Portacath or PICC line.   We strive to give you quality time with your provider. You may need to reschedule your appointment if you arrive late (15 or more minutes).  Arriving late affects you and other patients whose appointments are after yours.  Also, if you miss three or more appointments without notifying the office, you may be dismissed from the clinic at the provider's discretion.      For prescription refill requests, have your pharmacy contact our office and allow 72 hours for refills to be completed.    Today you received the following: pump disconnected per protocol. Udenyca injection    To help prevent nausea and vomiting after your treatment, we encourage you to take your nausea medication as directed.  BELOW ARE SYMPTOMS THAT SHOULD BE REPORTED IMMEDIATELY: *FEVER GREATER THAN 100.4 F (38 C) OR HIGHER *CHILLS OR SWEATING *NAUSEA AND VOMITING THAT IS NOT CONTROLLED WITH YOUR NAUSEA MEDICATION *UNUSUAL SHORTNESS OF BREATH *UNUSUAL BRUISING OR BLEEDING *URINARY PROBLEMS (pain or burning when urinating, or frequent urination) *BOWEL PROBLEMS (unusual diarrhea, constipation, pain near the anus) TENDERNESS IN MOUTH AND THROAT WITH OR WITHOUT PRESENCE OF ULCERS (sore throat, sores in mouth, or a toothache) UNUSUAL RASH, SWELLING OR PAIN  UNUSUAL VAGINAL DISCHARGE OR ITCHING   Items with * indicate a potential emergency and should be followed up  as soon as possible or go to the Emergency Department if any problems should occur.  Please show the CHEMOTHERAPY ALERT CARD or IMMUNOTHERAPY ALERT CARD at check-in to the Emergency Department and triage nurse.  Should you have questions after your visit or need to cancel or reschedule your appointment, please contact [facility] [place] - A DEPT OF [facility] [phone]  and follow the prompts.  Office hours are 8:00 a.m. to 4:30 p.m. Monday - Friday. Please note that voicemails left after 4:00 p.m. may not be returned until the following business day.  We are closed weekends and major holidays. You have access to a nurse at all times for urgent questions. Please call the main number to the clinic [phone] and follow the prompts.  For any non-urgent questions, you may also contact your provider using MyChart. We now offer e-Visits for anyone 106 and older to request care online for non-urgent symptoms. For details visit [email or web address].   Also download the MyChart app! Go to the app store, search MyChart, open the app, select [place], and log in with your MyChart username and password.

## 2024-02-18 ENCOUNTER — Encounter: Payer: Self-pay | Admitting: Oncology

## 2024-02-18 ENCOUNTER — Other Ambulatory Visit (HOSPITAL_BASED_OUTPATIENT_CLINIC_OR_DEPARTMENT_OTHER): Payer: Self-pay

## 2024-02-18 ENCOUNTER — Other Ambulatory Visit: Payer: Self-pay | Admitting: Oncology

## 2024-02-18 ENCOUNTER — Other Ambulatory Visit: Payer: Self-pay

## 2024-02-19 ENCOUNTER — Encounter: Payer: Self-pay | Admitting: Oncology

## 2024-02-25 ENCOUNTER — Encounter: Payer: Self-pay | Admitting: Oncology

## 2024-02-25 ENCOUNTER — Inpatient Hospital Stay: Attending: Oncology

## 2024-02-25 ENCOUNTER — Inpatient Hospital Stay

## 2024-02-25 ENCOUNTER — Inpatient Hospital Stay (HOSPITAL_BASED_OUTPATIENT_CLINIC_OR_DEPARTMENT_OTHER): Admitting: Oncology

## 2024-02-25 ENCOUNTER — Inpatient Hospital Stay: Admitting: Dietician

## 2024-02-25 DIAGNOSIS — C180 Malignant neoplasm of cecum: Secondary | ICD-10-CM | POA: Diagnosis not present

## 2024-02-25 DIAGNOSIS — C189 Malignant neoplasm of colon, unspecified: Secondary | ICD-10-CM

## 2024-02-25 DIAGNOSIS — E119 Type 2 diabetes mellitus without complications: Secondary | ICD-10-CM

## 2024-02-25 DIAGNOSIS — D509 Iron deficiency anemia, unspecified: Secondary | ICD-10-CM | POA: Insufficient documentation

## 2024-02-25 DIAGNOSIS — K1230 Oral mucositis (ulcerative), unspecified: Secondary | ICD-10-CM | POA: Diagnosis not present

## 2024-02-25 DIAGNOSIS — T451X5A Adverse effect of antineoplastic and immunosuppressive drugs, initial encounter: Secondary | ICD-10-CM | POA: Diagnosis not present

## 2024-02-25 DIAGNOSIS — D701 Agranulocytosis secondary to cancer chemotherapy: Secondary | ICD-10-CM | POA: Diagnosis not present

## 2024-02-25 DIAGNOSIS — Z5111 Encounter for antineoplastic chemotherapy: Secondary | ICD-10-CM | POA: Insufficient documentation

## 2024-02-25 DIAGNOSIS — C787 Secondary malignant neoplasm of liver and intrahepatic bile duct: Secondary | ICD-10-CM | POA: Insufficient documentation

## 2024-02-25 DIAGNOSIS — R110 Nausea: Secondary | ICD-10-CM | POA: Insufficient documentation

## 2024-02-25 DIAGNOSIS — G620 Drug-induced polyneuropathy: Secondary | ICD-10-CM

## 2024-02-25 DIAGNOSIS — D500 Iron deficiency anemia secondary to blood loss (chronic): Secondary | ICD-10-CM

## 2024-02-25 DIAGNOSIS — Z5189 Encounter for other specified aftercare: Secondary | ICD-10-CM | POA: Insufficient documentation

## 2024-02-25 DIAGNOSIS — E1165 Type 2 diabetes mellitus with hyperglycemia: Secondary | ICD-10-CM | POA: Diagnosis not present

## 2024-02-25 DIAGNOSIS — K1231 Oral mucositis (ulcerative) due to antineoplastic therapy: Secondary | ICD-10-CM

## 2024-02-25 LAB — COMPREHENSIVE METABOLIC PANEL WITH GFR
ALT: 25 U/L (ref 0–44)
AST: 36 U/L (ref 15–41)
Albumin: 4.8 g/dL (ref 3.5–5.0)
Alkaline Phosphatase: 75 U/L (ref 38–126)
Anion gap: 16 — ABNORMAL HIGH (ref 5–15)
BUN: 16 mg/dL (ref 8–23)
CO2: 25 mmol/L (ref 22–32)
Calcium: 9.9 mg/dL (ref 8.9–10.3)
Chloride: 97 mmol/L — ABNORMAL LOW (ref 98–111)
Creatinine, Ser: 0.97 mg/dL (ref 0.61–1.24)
GFR, Estimated: >60 mL/min (ref >60)
Glucose, Bld: 147 mg/dL — ABNORMAL HIGH (ref 70–99)
Potassium: 3.8 mmol/L (ref 3.5–5.1)
Sodium: 138 mmol/L (ref 135–145)
Total Bilirubin: 0.5 mg/dL (ref 0.0–1.2)
Total Protein: 7.6 g/dL (ref 6.5–8.1)

## 2024-02-25 LAB — CBC WITH DIFFERENTIAL/PLATELET
Abs Immature Granulocytes: 0.1 K/uL — ABNORMAL HIGH (ref 0.00–0.07)
Basophils Absolute: 0.1 K/uL (ref 0.0–0.1)
Basophils Relative: 3 %
Eosinophils Absolute: 0.1 K/uL (ref 0.0–0.5)
Eosinophils Relative: 2 %
HCT: 41.2 % (ref 39.0–52.0)
Hemoglobin: 12.8 g/dL — ABNORMAL LOW (ref 13.0–17.0)
Immature Granulocytes: 3 %
Lymphocytes Relative: 52 %
Lymphs Abs: 1.8 K/uL (ref 0.7–4.0)
MCH: 26.2 pg (ref 26.0–34.0)
MCHC: 31.1 g/dL (ref 30.0–36.0)
MCV: 84.3 fL (ref 80.0–100.0)
Monocytes Absolute: 0.5 K/uL (ref 0.1–1.0)
Monocytes Relative: 15 %
Neutro Abs: 0.8 K/uL — ABNORMAL LOW (ref 1.7–7.7)
Neutrophils Relative %: 25 %
Platelets: 187 K/uL (ref 150–400)
RBC: 4.89 MIL/uL (ref 4.22–5.81)
RDW: 24.9 % — ABNORMAL HIGH (ref 11.5–15.5)
Smear Review: NORMAL
WBC: 3.4 K/uL — ABNORMAL LOW (ref 4.0–10.5)
nRBC: 0.9 % — ABNORMAL HIGH (ref 0.0–0.2)

## 2024-02-25 LAB — VITAMIN B12: Vitamin B-12: 1032 pg/mL — ABNORMAL HIGH (ref 180–914)

## 2024-02-25 LAB — IRON AND TIBC
Iron: 81 ug/dL (ref 45–182)
Saturation Ratios: 14 % — ABNORMAL LOW (ref 17.9–39.5)
TIBC: 598 ug/dL — ABNORMAL HIGH (ref 250–450)
UIBC: 517 ug/dL

## 2024-02-25 LAB — MAGNESIUM: Magnesium: 2 mg/dL (ref 1.7–2.4)

## 2024-02-25 LAB — FOLATE: Folate: >20 ng/mL (ref >5.9)

## 2024-02-25 LAB — FERRITIN: Ferritin: 104 ng/mL (ref 24–336)

## 2024-02-25 NOTE — Addendum Note (Signed)
 Addended by: JOSHUA IVANOFF E on: 02/25/2024 11:14 AM   Modules accepted: Orders

## 2024-02-25 NOTE — Progress Notes (Unsigned)
 Nutrition Follow-up:  Pt with colon cancer metastatic to liver. He is receiving palliative chemotherapy with Folfox + Beva q14d as well as IV iron  as needed.   Planned to follow-up with patient during infusion. Treatment held today. Nutrition appointment rescheduled with next infusion.    Medications: reviewed   Labs: glucose 147  Anthropometrics: wt 308 lb 3.3 lb - increased from 305 lb 9.6 oz on 10/20  10/6 - 308 lb 9.6 oz 9/29 - 303 lb 9.6 oz  9/3 - 315 lb 7.7 oz      NEXT VISIT: Monday November 10 during infusion

## 2024-02-25 NOTE — Progress Notes (Signed)
 Patient Care Team: Bertell Satterfield, MD as PCP - General (Internal Medicine) Davonna Siad, MD as Medical Oncologist (Medical Oncology) Celestia Joesph SQUIBB, RN as Oncology Nurse Navigator (Medical Oncology)  Clinic Day:  02/25/2024  Referring physician: Bertell Satterfield, MD   CHIEF COMPLAINT:  CC: Metastatic colon carcinoma   ASSESSMENT & PLAN:   Assessment & Plan: Aaron Matthews.  is a 63 y.o. male with metastatic colon carcinoma  Assessment and Plan Assessment & Plan Metastatic colorectal cancer Stage IV metastatic colon adenocarcinoma-metastatic to liver Oncology history below Biopsy-proven from the liver biopsy.  MMR proficient. [facility] NGS: KRAS exon 2 mutation present, PD-L1: 0, HER2 negative -12/31/2023: Started on FOLFOX +Avastin .  Added Avastin  with second cycle - Patient tolerated treatment well.  No significant complaints noted. -Scheduled for cycle 5-day 1 today but ANC is below 1.  Hold and repeat labs in 1 week.  -Will repeat CT CAP after 6 cycles of chemotherapy I.e 3 months-CT scheduled for 03/24/2024. - Will check CEA with every other cycle   Return to in 1 week  Metastasis to liver Biopsy-proven as below Radiation not an option.  Due to the extent of the disease surgical resection was not an option  - Chemotherapy as above  Chemotherapy-induced nausea Nausea effectively managed with Zofran .  - Continue preemptive use of Zofran  for nausea management.  Chemotherapy-induced oral mucositis Oral mucositis present, with relief from magic mouthwash.  - Continue use of magic mouthwash. - Monitor oral mucositis and consider reducing chemotherapy dose if symptoms worsen.  Chemotherapy-induced peripheral neuropathy Peripheral neuropathy symptoms present but improved from prior.  Leukopenia secondary to chemotherapy Leukopenia present but expected due to chemotherapy. White blood cell count low but not prohibitive for continuing chemotherapy.  -  Administer chemotherapy as planned. - Growth factors support added.  Iron  deficiency anemia Iron  deficiency anemia. Hemoglobin levels improved significantly with IV iron  replacement. Iron  levels improved but iron  saturation is still low.  Hemoglobin 12.8. Patient could benefit from additional dose of iron .  - Repeat iron  labs in a few weeks to assess treatment efficacy.  Type 2 diabetes mellitus, poorly controlled Diabetes poorly controlled. Blood sugar levels improved slightly.  - Emphasize dietary management to control blood sugar levels. - Monitor kidney function and protein levels in urine. -Continue to follow-up with primary care   The patient understands the plans discussed today and is in agreement with them.  He knows to contact our office if he develops concerns prior to his next appointment.  I spent 25 minutes dedicated to the care of this patient (face-to-face and non-face-to-face) on the date of the encounter to include what is described in the assessment and plan.    Delon FORBES Hope, NP  [facility] [place] - A DEPT OF [facility] [address] [place] [address] Dept: [phone] Dept Fax: [phone]   No orders of the defined types were placed in this encounter.    ONCOLOGY HISTORY:   Diagnosis: Metastatic colon adenocarcinoma   -11/28/2023: Cecal mass biopsy: Invasive moderately differentiated adenocarcinoma. -11/28/2023: Colonoscopy: Ulcerated tumor in the cecum.  Markedly redundant and elongated colon. -11/29/2023: CT RJE:Uyzmz is a single ill-defined hypoattenuating 2.4 x 2.7 cm lesion in the right hepatic lobe, segment 5, which is incompletely characterized on the current exam. Further evaluation with nonemergent MRI abdomen as per liver mass protocol is recommended. There are multiple, sub 4 mm, solid, calcified and noncalcified nodules throughout bilateral lungs.  Otherwise no metastatic  disease identified within the chest, abdomen or pelvis. -11/30/2023: MRI liver:There are at least 5 hepatic lesions, compatible with metastases. The dominant lesion is in the right hepatic lobe, segment 5 measuring 2.9 x 3.2 cm. -12/11/2023: Liver, biopsy, right lobe, mass :       Metastatic adenocarcinoma with necrosis, consistent with colorectal primary  -Evaluated by radiation oncology for liver metastasis, radiation ordered option.  Discussed at tumor board and considering the extent of disease, surgery not an option for the patient. -12/28/2023: IR guided port placement - 12/31/2023-Current: FOLFOX + Bevacizumab .  Avastin  added at cycle 2 - 01/02/2024 [facility] NGS: KRAS exon 2 mutation: Positive, MSI-stable, MMR: Proficient, PD-L1: Negative, TPS:0%  - BRAF, NTRK 1/2/3, RET, EGFR, HER2, NF1, NRAS, PIK3CA,POLE: Negative  -Pathogenic variants: AMER 1, APC, AXIN1, KRAS, TP53  Current Treatment:  FOLFOX + Bevacizumab   INTERVAL HISTORY:  Discussed the use of AI scribe software for clinical note transcription with the patient, who gave verbal consent to proceed.  History of Present Illness Aaron Matthews. is a 63 year old male with colorectal cancer who presents for chemotherapy follow-up.  Patient presents today with his daughter.  Overall, he has been feeling well.  He just got back from the beach thinks he may have gotten some sun on his arms which has caused a rash that is not itchy.  He is undergoing chemotherapy for colorectal cancer and has been receiving Avastin  as part of his treatment. His CEA cancer marker has been decreasing most recently 25.  He experiences numbness and tingling in his hands and feet, particularly during the treatment days (Monday to Wednesday) and worsening on Thursday and Friday. However, he notes improvement during the week off from treatment, allowing him to engage in activities like cooking and delivering meals to others.  He has a history of iron   deficiency anemia and recently received an iron  infusion. His hemoglobin level improved after the infusion.  He denies any bleeding.  He reports a sore in his mouth, which he attributes to his dental plate moving and causing irritation. He has been using a prescribed magic mouthwash for relief.   I have reviewed the past medical history, past surgical history, social history and family history with the patient and they are unchanged from previous note.  ALLERGIES:  is allergic to insulin  glargine and penicillins.  MEDICATIONS:  Current Outpatient Medications  Medication Sig Dispense Refill   Alpha-Lipoic Acid 600 MG CAPS Take 1 capsule (600 mg total) by mouth 2 (two) times daily. 60 capsule 11   alum & mag hydroxide-simeth (MAALOX/MYLANTA) 200-200-20 MG/5ML suspension Take 15 mLs by mouth every 6 (six) hours as needed for indigestion (Mix 1:1 with lidocaine  and swish and spit). 355 mL 0   aspirin  EC 81 MG tablet Take 81 mg by mouth daily. Swallow whole.     atorvastatin  (LIPITOR) 20 MG tablet Take 1 tablet (20 mg total) by mouth daily. 90 tablet 3   dexamethasone  (DECADRON ) 4 MG tablet Take 2 tablets (8 mg total) by mouth daily. Start the day after chemotherapy for 2 days. Take with food. 30 tablet 1   empagliflozin  (JARDIANCE ) 25 MG TABS tablet Take 1 tablet (25 mg total) by mouth daily. 90 tablet 3   enalapril  (VASOTEC ) 10 MG tablet Take 1 tablet (10 mg total) by mouth 2 (two) times daily. 180 tablet 3   fexofenadine (ALLEGRA) 180 MG tablet Take 180 mg by mouth daily.     furosemide  (  LASIX ) 40 MG tablet Take 1 tablet (40 mg total) by mouth daily. 30 tablet 3   gabapentin  (NEURONTIN ) 300 MG capsule Take 1 capsule (300 mg total) by mouth 4 (four) times daily. 90 capsule 0   gemfibrozil  (LOPID ) 600 MG tablet Take 1 tablet (600 mg total) by mouth 2 (two) times daily. 180 tablet 3   HUMALOG KWIKPEN 100 UNIT/ML KiwkPen Inject 33 Units into the muscle 3 (three) times daily.     insulin  degludec  (TRESIBA ) 100 UNIT/ML FlexTouch Pen Inject 50 Units into the skin 2 (two) times daily.     insulin  degludec (TRESIBA ) 100 UNIT/ML FlexTouch Pen Inject 100 Units into the skin 2 (two) times daily. 90 mL 3   JARDIANCE  25 MG TABS tablet Take 1 tablet (25 mg total) by mouth daily. 30 tablet 0   lidocaine  (XYLOCAINE ) 2 % solution Use as directed 15 mLs in the mouth or throat every 6 (six) hours as needed for mouth pain (Mix 1:1 with Maalox and Swish and spit). 200 mL 2   lidocaine -prilocaine  (EMLA ) cream Apply 1 Application topically as needed (Apply prior to port access). 30 g 3   meclizine  (ANTIVERT ) 25 MG tablet Take 1 tablet (25 mg total) by mouth daily as needed for dizziness. 30 tablet 0   metFORMIN  (GLUCOPHAGE -XR) 750 MG 24 hr tablet Take 1 tablet (750 mg total) by mouth daily with breakfast. 30 tablet 0   MOUNJARO  2.5 MG/0.5ML Pen Inject 2.5 mg into the skin once a week.     ondansetron  (ZOFRAN ) 8 MG tablet Take 1 tablet (8 mg total) by mouth every 8 (eight) hours as needed for nausea or vomiting. Start on the third day after chemotherapy. 36 tablet 1   pantoprazole  (PROTONIX ) 40 MG tablet Take 1 tablet (40 mg total) by mouth daily. 30 tablet 0   prochlorperazine  (COMPAZINE ) 10 MG tablet Take 1 tablet (10 mg total) by mouth every 6 (six) hours as needed for nausea or vomiting. 30 tablet 1   tirzepatide  (MOUNJARO ) 2.5 MG/0.5ML Pen Inject 2.5 mg into the skin once a week. 4 mL 1   No current facility-administered medications for this visit.   Facility-Administered Medications Ordered in Other Visits  Medication Dose Route Frequency Provider Last Rate Last Admin   0.9 %  sodium chloride  infusion   Intravenous Continuous Kandala, Hyndavi, MD   Stopped at 01/28/24 1113   dextrose  5 % solution   Intravenous Continuous Kandala, Hyndavi, MD   Stopped at 01/28/24 1404    REVIEW OF SYSTEMS:   Constitutional: Denies fevers, chills or abnormal weight loss Eyes: Denies blurriness of vision Ears, nose,  mouth, throat, and face: Denies mucositis or sore throat Respiratory: Denies cough, dyspnea or wheezes Cardiovascular: Denies palpitation, chest discomfort or lower extremity swelling Gastrointestinal:  Denies nausea, heartburn or change in bowel habits Skin: Denies abnormal skin rashes Lymphatics: Denies new lymphadenopathy or easy bruising Neurological:Denies numbness, tingling or new weaknesses Behavioral/Psych: Mood is stable, no new changes  All other systems were reviewed with the patient and are negative.   VITALS:  There were no vitals taken for this visit.  Wt Readings from Last 3 Encounters:  02/25/24 (!) 308 lb 3.3 oz (139.8 kg)  02/11/24 (!) 305 lb 9.6 oz (138.6 kg)  01/28/24 (!) 308 lb 9.6 oz (140 kg)    There is no height or weight on file to calculate BMI.  Performance status (ECOG): 1 - Symptomatic but completely ambulatory  PHYSICAL EXAM:   GENERAL:alert,  no distress and comfortable SKIN: skin color, texture, turgor are normal, no rashes or significant lesions HEENT: No evidence of mucositis LYMPH:  no palpable lymphadenopathy in the cervical, axillary or inguinal LUNGS: clear to auscultation and percussion with normal breathing effort HEART: regular rate & rhythm and no murmurs and no lower extremity edema ABDOMEN:abdomen soft, non-tender and normal bowel sounds Musculoskeletal:no cyanosis of digits and no clubbing  NEURO: alert & oriented x 3 with fluent speech, no focal motor/sensory deficits  LABORATORY DATA:  I have reviewed the data as listed    Component Value Date/Time   NA 138 02/25/2024 0907   K 3.8 02/25/2024 0907   CL 97 (L) 02/25/2024 0907   CO2 25 02/25/2024 0907   GLUCOSE 147 (H) 02/25/2024 0907   BUN 16 02/25/2024 0907   CREATININE 0.97 02/25/2024 0907   CALCIUM  9.9 02/25/2024 0907   PROT 7.6 02/25/2024 0907   ALBUMIN 4.8 02/25/2024 0907   AST 36 02/25/2024 0907   ALT 25 02/25/2024 0907   ALKPHOS 75 02/25/2024 0907   BILITOT 0.5  02/25/2024 0907   GFRNONAA >60 02/25/2024 0907   GFRAA >60 04/16/2016 1019    Lab Results  Component Value Date   WBC 3.4 (L) 02/25/2024   NEUTROABS PENDING 02/25/2024   HGB 12.8 (L) 02/25/2024   HCT 41.2 02/25/2024   MCV 84.3 02/25/2024   PLT 187 02/25/2024      Chemistry      Component Value Date/Time   NA 138 02/25/2024 0907   K 3.8 02/25/2024 0907   CL 97 (L) 02/25/2024 0907   CO2 25 02/25/2024 0907   BUN 16 02/25/2024 0907   CREATININE 0.97 02/25/2024 0907      Component Value Date/Time   CALCIUM  9.9 02/25/2024 0907   ALKPHOS 75 02/25/2024 0907   AST 36 02/25/2024 0907   ALT 25 02/25/2024 0907   BILITOT 0.5 02/25/2024 0907      Latest Reference Range & Units 12/19/23 12:02  Iron  45 - 182 ug/dL 11 (L)  UIBC ug/dL 368  TIBC 749 - 549 ug/dL 357 (H)  Saturation Ratios 17.9 - 39.5 % 2 (L)  Ferritin 24 - 336 ng/mL 3 (L)  (L): Data is abnormally low (H): Data is abnormally high   Latest Reference Range & Units 01/14/24 10:04  CEA 0.0 - 4.7 ng/mL 87.2 (H)  (H): Data is abnormally high  RADIOGRAPHIC STUDIES: I have personally reviewed the radiological images as listed and agreed with the findings in the report.  None new to review

## 2024-02-25 NOTE — Progress Notes (Signed)
 We will hold treatment today due to [facility] of 0.8 and patient will return on next week for treatment per Delon Hope, NP.

## 2024-02-26 ENCOUNTER — Other Ambulatory Visit: Payer: Self-pay

## 2024-02-26 ENCOUNTER — Encounter: Payer: Self-pay | Admitting: Oncology

## 2024-02-27 ENCOUNTER — Inpatient Hospital Stay

## 2024-02-28 ENCOUNTER — Other Ambulatory Visit: Payer: Self-pay | Admitting: Oncology

## 2024-02-28 ENCOUNTER — Other Ambulatory Visit (HOSPITAL_BASED_OUTPATIENT_CLINIC_OR_DEPARTMENT_OTHER): Payer: Self-pay

## 2024-02-29 ENCOUNTER — Other Ambulatory Visit (HOSPITAL_BASED_OUTPATIENT_CLINIC_OR_DEPARTMENT_OTHER): Payer: Self-pay

## 2024-02-29 ENCOUNTER — Encounter: Payer: Self-pay | Admitting: Oncology

## 2024-02-29 MED ORDER — GABAPENTIN 300 MG PO CAPS
300.0000 mg | ORAL_CAPSULE | Freq: Four times a day (QID) | ORAL | 0 refills | Status: DC
  Filled 2024-02-29: qty 90, 23d supply, fill #0

## 2024-03-03 ENCOUNTER — Inpatient Hospital Stay

## 2024-03-03 ENCOUNTER — Inpatient Hospital Stay: Admitting: Dietician

## 2024-03-03 VITALS — BP 132/70 | HR 92 | Temp 97.7°F | Resp 18

## 2024-03-03 DIAGNOSIS — C189 Malignant neoplasm of colon, unspecified: Secondary | ICD-10-CM

## 2024-03-03 DIAGNOSIS — Z5111 Encounter for antineoplastic chemotherapy: Secondary | ICD-10-CM | POA: Diagnosis not present

## 2024-03-03 LAB — CBC WITH DIFFERENTIAL/PLATELET
Abs Immature Granulocytes: 0.3 K/uL — ABNORMAL HIGH (ref 0.00–0.07)
Basophils Absolute: 0.1 K/uL (ref 0.0–0.1)
Basophils Relative: 2 %
Eosinophils Absolute: 0.1 K/uL (ref 0.0–0.5)
Eosinophils Relative: 2 %
HCT: 43.1 % (ref 39.0–52.0)
Hemoglobin: 13.5 g/dL (ref 13.0–17.0)
Immature Granulocytes: 4 %
Lymphocytes Relative: 30 %
Lymphs Abs: 2.2 K/uL (ref 0.7–4.0)
MCH: 26.8 pg (ref 26.0–34.0)
MCHC: 31.3 g/dL (ref 30.0–36.0)
MCV: 85.5 fL (ref 80.0–100.0)
Monocytes Absolute: 0.7 K/uL (ref 0.1–1.0)
Monocytes Relative: 10 %
Neutro Abs: 3.9 K/uL (ref 1.7–7.7)
Neutrophils Relative %: 52 %
Platelets: 203 K/uL (ref 150–400)
RBC: 5.04 MIL/uL (ref 4.22–5.81)
RDW: 23.7 % — ABNORMAL HIGH (ref 11.5–15.5)
Smear Review: NORMAL
WBC: 7.4 K/uL (ref 4.0–10.5)
nRBC: 0 % (ref 0.0–0.2)

## 2024-03-03 LAB — COMPREHENSIVE METABOLIC PANEL WITH GFR
ALT: 21 U/L (ref 0–44)
AST: 33 U/L (ref 15–41)
Albumin: 4.6 g/dL (ref 3.5–5.0)
Alkaline Phosphatase: 71 U/L (ref 38–126)
Anion gap: 15 (ref 5–15)
BUN: 22 mg/dL (ref 8–23)
CO2: 26 mmol/L (ref 22–32)
Calcium: 10.1 mg/dL (ref 8.9–10.3)
Chloride: 94 mmol/L — ABNORMAL LOW (ref 98–111)
Creatinine, Ser: 0.97 mg/dL (ref 0.61–1.24)
GFR, Estimated: >60 mL/min (ref >60)
Glucose, Bld: 136 mg/dL — ABNORMAL HIGH (ref 70–99)
Potassium: 4.2 mmol/L (ref 3.5–5.1)
Sodium: 135 mmol/L (ref 135–145)
Total Bilirubin: 0.4 mg/dL (ref 0.0–1.2)
Total Protein: 7.5 g/dL (ref 6.5–8.1)

## 2024-03-03 LAB — GLUCOSE, CAPILLARY: Glucose-Capillary: 324 mg/dL — ABNORMAL HIGH (ref 70–99)

## 2024-03-03 LAB — MAGNESIUM: Magnesium: 2.5 mg/dL — ABNORMAL HIGH (ref 1.7–2.4)

## 2024-03-03 MED ORDER — LEUCOVORIN CALCIUM INJECTION 350 MG
400.0000 mg/m2 | Freq: Once | INTRAVENOUS | Status: AC
  Administered 2024-03-03: 1076 mg via INTRAVENOUS
  Filled 2024-03-03: qty 53.8

## 2024-03-03 MED ORDER — DEXAMETHASONE SOD PHOSPHATE PF 10 MG/ML IJ SOLN
10.0000 mg | Freq: Once | INTRAMUSCULAR | Status: AC
  Administered 2024-03-03: 10 mg via INTRAVENOUS

## 2024-03-03 MED ORDER — SODIUM CHLORIDE 0.9 % IV SOLN: INTRAVENOUS | Status: DC

## 2024-03-03 MED ORDER — PALONOSETRON HCL INJECTION 0.25 MG/5ML
0.2500 mg | Freq: Once | INTRAVENOUS | Status: AC
  Administered 2024-03-03: 0.25 mg via INTRAVENOUS

## 2024-03-03 MED ORDER — SODIUM CHLORIDE 0.9 % IV SOLN
5.0000 mg/kg | Freq: Once | INTRAVENOUS | Status: AC
  Administered 2024-03-03: 700 mg via INTRAVENOUS
  Filled 2024-03-03: qty 16

## 2024-03-03 MED ORDER — FLUOROURACIL CHEMO INJECTION 2.5 GM/50ML
400.0000 mg/m2 | Freq: Once | INTRAVENOUS | Status: AC
  Administered 2024-03-03: 1000 mg via INTRAVENOUS
  Filled 2024-03-03: qty 20

## 2024-03-03 MED ORDER — DEXTROSE 5 % IV SOLN: INTRAVENOUS | Status: DC

## 2024-03-03 MED ORDER — SODIUM CHLORIDE 0.9 % IV SOLN
2400.0000 mg/m2 | INTRAVENOUS | Status: DC
  Administered 2024-03-03: 7000 mg via INTRAVENOUS
  Filled 2024-03-03: qty 140

## 2024-03-03 MED ORDER — OXALIPLATIN CHEMO INJECTION 100 MG/20ML
85.0000 mg/m2 | Freq: Once | INTRAVENOUS | Status: AC
  Administered 2024-03-03: 225 mg via INTRAVENOUS
  Filled 2024-03-03: qty 40

## 2024-03-03 NOTE — Patient Instructions (Addendum)
 [facility] - A DEPT OF [facility]  Discharge Instructions: Thank you for choosing [facility] to provide your oncology and hematology care.  If you have a lab appointment with the Cancer Center - please note that after April 8th, 2024, all labs will be drawn in the cancer center.  You do not have to check in or register with the main entrance as you have in the past but will complete your check-in in the cancer center.  Wear comfortable clothing and clothing appropriate for easy access to any Portacath or PICC line.   We strive to give you quality time with your provider. You may need to reschedule your appointment if you arrive late (15 or more minutes).  Arriving late affects you and other patients whose appointments are after yours.  Also, if you miss three or more appointments without notifying the office, you may be dismissed from the clinic at the provider's discretion.      For prescription refill requests, have your pharmacy contact our office and allow 72 hours for refills to be completed.    Today you received the following chemotherapy and/or immunotherapy agents Leucovorin  Injection What is this medication? Fluorouracil  Injection What is this medication? FLUOROURACIL  (flure oh YOOR a sil) treats some types of cancer. It works by slowing down the growth of cancer cells. This medicine may be used for other purposes; ask your health care provider or pharmacist if you have questions. COMMON BRAND NAME(S): Adrucil  What should I tell my care team before I take this medication? They need to know if you have any of these conditions: Blood disorders Dihydropyrimidine dehydrogenase (DPD) deficiency Infection, such as chickenpox, cold sores, herpes Kidney disease Liver disease Poor nutrition Recent or ongoing radiation therapy An unusual or allergic reaction to fluorouracil , other medications, foods, dyes, or preservatives If you or your  partner are pregnant or trying to get pregnant Breast-feeding How should I use this medication? This medication is injected into a vein. It is administered by your care team in a hospital or clinic setting. Talk to your care team about the use of this medication in children. Special care may be needed. Overdosage: If you think you have taken too much of this medicine contact a poison control center or emergency room at once. NOTE: This medicine is only for you. Do not share this medicine with others. What if I miss a dose? Keep appointments for follow-up doses. It is important not to miss your dose. Call your care team if you are unable to keep an appointment. What may interact with this medication? Do not take this medication with any of the following: Live virus vaccines This medication may also interact with the following: Medications that treat or prevent blood clots, such as warfarin, enoxaparin , dalteparin This list may not describe all possible interactions. Give your health care provider a list of all the medicines, herbs, non-prescription drugs, or dietary supplements you use. Also tell them if you smoke, drink alcohol, or use illegal drugs. Some items may interact with your medicine. What should I watch for while using this medication? Your condition will be monitored carefully while you are receiving this medication. This medication may make you feel generally unwell. This is not uncommon as chemotherapy can affect healthy cells as well as cancer cells. Report any side effects. Continue your course of treatment even though you feel ill unless your care team tells you to stop. In some cases,  you may be given additional medications to help with side effects. Follow all directions for their use. This medication may increase your risk of getting an infection. Call your care team for advice if you get a fever, chills, sore throat, or other symptoms of a cold or flu. Do not treat yourself.  Try to avoid being around people who are sick. This medication may increase your risk to bruise or bleed. Call your care team if you notice any unusual bleeding. Be careful brushing or flossing your teeth or using a toothpick because you may get an infection or bleed more easily. If you have any dental work done, tell your dentist you are receiving this medication. Avoid taking medications that contain aspirin , acetaminophen , ibuprofen, naproxen, or ketoprofen unless instructed by your care team. These medications may hide a fever. Do not treat diarrhea with over the counter products. Contact your care team if you have diarrhea that lasts more than 2 days or if it is severe and watery. This medication can make you more sensitive to the sun. Keep out of the sun. If you cannot avoid being in the sun, wear protective clothing and sunscreen. Do not use sun lamps, tanning beds, or tanning booths. Talk to your care team if you or your partner wish to become pregnant or think you might be pregnant. This medication can cause serious birth defects if taken during pregnancy and for 3 months after the last dose. A reliable form of contraception is recommended while taking this medication and for 3 months after the last dose. Talk to your care team about effective forms of contraception. Do not father a child while taking this medication and for 3 months after the last dose. Use a condom while having sex during this time period. Do not breastfeed while taking this medication. This medication may cause infertility. Talk to your care team if you are concerned about your fertility. What side effects may I notice from receiving this medication? Side effects that you should report to your care team as soon as possible: Allergic reactions--skin rash, itching, hives, swelling of the face, lips, tongue, or throat Heart attack--pain or tightness in the chest, shoulders, arms, or jaw, nausea, shortness of breath, cold or  clammy skin, feeling faint or lightheaded Heart failure--shortness of breath, swelling of the ankles, feet, or hands, sudden weight gain, unusual weakness or fatigue Heart rhythm changes--fast or irregular heartbeat, dizziness, feeling faint or lightheaded, chest pain, trouble breathing High ammonia level--unusual weakness or fatigue, confusion, loss of appetite, nausea, vomiting, seizures Infection--fever, chills, cough, sore throat, wounds that don't heal, pain or trouble when passing urine, general feeling of discomfort or being unwell Low red blood cell level--unusual weakness or fatigue, dizziness, headache, trouble breathing Pain, tingling, or numbness in the hands or feet, muscle weakness, change in vision, confusion or trouble speaking, loss of balance or coordination, trouble walking, seizures Redness, swelling, and blistering of the skin over hands and feet Severe or prolonged diarrhea Unusual bruising or bleeding Side effects that usually do not require medical attention (report to your care team if they continue or are bothersome): Dry skin Headache Increased tears Nausea Pain, redness, or swelling with sores inside the mouth or throat Sensitivity to light Vomiting This list may not describe all possible side effects. Call your doctor for medical advice about side effects. You may report side effects to FDA at 1-800-FDA-1088. Where should I keep my medication? This medication is given in a hospital or clinic. It will  not be stored at home. NOTE: This sheet is a summary. It may not cover all possible information. If you have questions about this medicine, talk to your doctor, pharmacist, or health care provider.  2024 Elsevier/Gold Standard (2021-08-16 00:00:00)  LEUCOVORIN  (loo koe VOR in) prevents side effects from certain medications, such as methotrexate. It works by increasing folate levels. This helps protect healthy cells in your body. It may also be used to treat anemia  caused by low levels of folate. It can also be used with fluorouracil , a type of chemotherapy, to treat colorectal cancer. It works by increasing the effects of fluorouracil  in the body. This medicine may be used for other purposes; ask your health care provider or pharmacist if you have questions. What should I tell my care team before I take this medication? They need to know if you have any of these conditions: Anemia from low levels of vitamin B12 in the blood An unusual or allergic reaction to leucovorin , folic acid , other medications, foods, dyes, or preservatives Pregnant or trying to get pregnant Breastfeeding How should I use this medication? This medication is injected into a vein or a muscle. It is given by your care team in a hospital or clinic setting. Talk to your care team about the use of this medication in children. Special care may be needed. Overdosage: If you think you have taken too much of this medicine contact a poison control center or emergency room at once. NOTE: This medicine is only for you. Do not share this medicine with others. What if I miss a dose? Keep appointments for follow-up doses. It is important not to miss your dose. Call your care team if you are unable to keep an appointment. What may interact with this medication? Capecitabine Fluorouracil  Phenobarbital Phenytoin Primidone Trimethoprim;sulfamethoxazole This list may not describe all possible interactions. Give your health care provider a list of all the medicines, herbs, non-prescription drugs, or dietary supplements you use. Also tell them if you smoke, drink alcohol, or use illegal drugs. Some items may interact with your medicine. What should I watch for while using this medication? Your condition will be monitored carefully while you are receiving this medication. This medication may increase the side effects of 5-fluorouracil . Tell your care team if you have diarrhea or mouth sores that do not  get better or that get worse. What side effects may I notice from receiving this medication? Side effects that you should report to your care team as soon as possible: Allergic reactions--skin rash, itching, hives, swelling of the face, lips, tongue, or throat This list may not describe all possible side effects. Call your doctor for medical advice about side effects. You may report side effects to FDA at 1-800-FDA-1088. Where should I keep my medication? This medication is given in a hospital or clinic. It will not be stored at home. NOTE: This sheet is a summary. It may not cover all possible information. If you have questions about this medicine, talk to your doctor, pharmacist, or health care provider.  2024 Elsevier/Gold Standard (2021-09-13 00:00:00)Oxaliplatin  Injection What is this medication? OXALIPLATIN  (ox AL i PLA tin) treats colorectal cancer. It works by slowing down the growth of cancer cells. This medicine may be used for other purposes; ask your health care provider or pharmacist if you have questions. COMMON BRAND NAME(S): Eloxatin  What should I tell my care team before I take this medication? They need to know if you have any of these conditions: Heart disease  History of irregular heartbeat or rhythm Liver disease Low blood cell levels (white cells, red cells, and platelets) Lung or breathing disease, such as asthma Take medications that treat or prevent blood clots Tingling of the fingers, toes, or other nerve disorder An unusual or allergic reaction to oxaliplatin , other medications, foods, dyes, or preservatives If you or your partner are pregnant or trying to get pregnant Breast-feeding How should I use this medication? This medication is injected into a vein. It is given by your care team in a hospital or clinic setting. Talk to your care team about the use of this medication in children. Special care may be needed. Overdosage: If you think you have taken too much  of this medicine contact a poison control center or emergency room at once. NOTE: This medicine is only for you. Do not share this medicine with others. What if I miss a dose? Keep appointments for follow-up doses. It is important not to miss a dose. Call your care team if you are unable to keep an appointment. What may interact with this medication? Do not take this medication with any of the following: Cisapride Dronedarone Pimozide Thioridazine This medication may also interact with the following: Aspirin  and aspirin -like medications Certain medications that treat or prevent blood clots, such as warfarin, apixaban, dabigatran, and rivaroxaban  Cisplatin Cyclosporine Diuretics Medications for infection, such as acyclovir, adefovir, amphotericin B, bacitracin, cidofovir, foscarnet, ganciclovir, gentamicin, pentamidine, vancomycin NSAIDs, medications for pain and inflammation, such as ibuprofen or naproxen Other medications that cause heart rhythm changes Pamidronate Zoledronic acid This list may not describe all possible interactions. Give your health care provider a list of all the medicines, herbs, non-prescription drugs, or dietary supplements you use. Also tell them if you smoke, drink alcohol, or use illegal drugs. Some items may interact with your medicine. What should I watch for while using this medication? Your condition will be monitored carefully while you are receiving this medication. You may need blood work while taking this medication. This medication may make you feel generally unwell. This is not uncommon as chemotherapy can affect healthy cells as well as cancer cells. Report any side effects. Continue your course of treatment even though you feel ill unless your care team tells you to stop. This medication may increase your risk of getting an infection. Call your care team for advice if you get a fever, chills, sore throat, or other symptoms of a cold or flu. Do not treat  yourself. Try to avoid being around people who are sick. Avoid taking medications that contain aspirin , acetaminophen , ibuprofen, naproxen, or ketoprofen unless instructed by your care team. These medications may hide a fever. Be careful brushing or flossing your teeth or using a toothpick because you may get an infection or bleed more easily. If you have any dental work done, tell your dentist you are receiving this medication. This medication can make you more sensitive to cold. Do not drink cold drinks or use ice. Cover exposed skin before coming in contact with cold temperatures or cold objects. When out in cold weather wear warm clothing and cover your mouth and nose to warm the air that goes into your lungs. Tell your care team if you get sensitive to the cold. Talk to your care team if you or your partner are pregnant or think either of you might be pregnant. This medication can cause serious birth defects if taken during pregnancy and for 9 months after the last dose. A negative pregnancy test  is required before starting this medication. A reliable form of contraception is recommended while taking this medication and for 9 months after the last dose. Talk to your care team about effective forms of contraception. Do not father a child while taking this medication and for 6 months after the last dose. Use a condom while having sex during this time period. Do not breastfeed while taking this medication and for 3 months after the last dose. This medication may cause infertility. Talk to your care team if you are concerned about your fertility. What side effects may I notice from receiving this medication? Side effects that you should report to your care team as soon as possible: Allergic reactions--skin rash, itching, hives, swelling of the face, lips, tongue, or throat Bleeding--bloody or black, tar-like stools, vomiting blood or brown material that looks like coffee grounds, red or dark brown urine,  small red or purple spots on skin, unusual bruising or bleeding Dry cough, shortness of breath or trouble breathing Heart rhythm changes--fast or irregular heartbeat, dizziness, feeling faint or lightheaded, chest pain, trouble breathing Infection--fever, chills, cough, sore throat, wounds that don't heal, pain or trouble when passing urine, general feeling of discomfort or being unwell Liver injury--right upper belly pain, loss of appetite, nausea, light-colored stool, dark yellow or brown urine, yellowing skin or eyes, unusual weakness or fatigue Low red blood cell level--unusual weakness or fatigue, dizziness, headache, trouble breathing Muscle injury--unusual weakness or fatigue, muscle pain, dark yellow or brown urine, decrease in amount of urine Pain, tingling, or numbness in the hands or feet Sudden and severe headache, confusion, change in vision, seizures, which may be signs of posterior reversible encephalopathy syndrome (PRES) Unusual bruising or bleeding Side effects that usually do not require medical attention (report to your care team if they continue or are bothersome): Diarrhea Nausea Pain, redness, or swelling with sores inside the mouth or throat Unusual weakness or fatigue Vomiting This list may not describe all possible side effects. Call your doctor for medical advice about side effects. You may report side effects to FDA at 1-800-FDA-1088. Where should I keep my medication? This medication is given in a hospital or clinic. It will not be stored at home. NOTE: This sheet is a summary. It may not cover all possible information. If you have questions about this medicine, talk to your doctor, pharmacist, or health care provider.  2024 Elsevier/Gold Standard (2023-03-23 00:00:00)Bevacizumab  Injection What is this medication? .hom BEVACIZUMAB  (be va SIZ yoo mab) treats some types of cancer. It works by blocking a protein that causes cancer cells to grow and multiply. This  helps to slow or stop the spread of cancer cells. It is a monoclonal antibody. This medicine may be used for other purposes; ask your health care provider or pharmacist if you have questions. COMMON BRAND NAME(S): Alymsys , Avastin , MVASI , Vegzalma, Zirabev  What should I tell my care team before I take this medication? They need to know if you have any of these conditions: Blood clots Coughing up blood Having or recent surgery Heart failure High blood pressure History of a connection between 2 or more body parts that do not usually connect (fistula) History of a tear in your stomach or intestines Protein in your urine An unusual or allergic reaction to bevacizumab , other medications, foods, dyes, or preservatives Pregnant or trying to get pregnant Breast-feeding How should I use this medication? This medication is injected into a vein. It is given by your care team in a hospital or  clinic setting. Talk to your care team the use of this medication in children. Special care may be needed. Overdosage: If you think you have taken too much of this medicine contact a poison control center or emergency room at once. NOTE: This medicine is only for you. Do not share this medicine with others. What if I miss a dose? Keep appointments for follow-up doses. It is important not to miss your dose. Call your care team if you are unable to keep an appointment. What may interact with this medication? Interactions are not expected. This list may not describe all possible interactions. Give your health care provider a list of all the medicines, herbs, non-prescription drugs, or dietary supplements you use. Also tell them if you smoke, drink alcohol, or use illegal drugs. Some items may interact with your medicine. What should I watch for while using this medication? Your condition will be monitored carefully while you are receiving this medication. You may need blood work while taking this medication. This  medication may make you feel generally unwell. This is not uncommon as chemotherapy can affect healthy cells as well as cancer cells. Report any side effects. Continue your course of treatment even though you feel ill unless your care team tells you to stop. This medication may increase your risk to bruise or bleed. Call your care team if you notice any unusual bleeding. Before having surgery, talk to your care team to make sure it is ok. This medication can increase the risk of poor healing of your surgical site or wound. You will need to stop this medication for 28 days before surgery. After surgery, wait at least 28 days before restarting this medication. Make sure the surgical site or wound is healed enough before restarting this medication. Talk to your care team if questions. Talk to your care team if you may be pregnant. Serious birth defects can occur if you take this medication during pregnancy and for 6 months after the last dose. Contraception is recommended while taking this medication and for 6 months after the last dose. Your care team can help you find the option that works for you. Do not breastfeed while taking this medication and for 6 months after the last dose. This medication can cause infertility. Talk to your care team if you are concerned about your fertility. What side effects may I notice from receiving this medication? Side effects that you should report to your care team as soon as possible: Allergic reactions--skin rash, itching, hives, swelling of the face, lips, tongue, or throat Bleeding--bloody or black, tar-like stools, vomiting blood or brown material that looks like coffee grounds, red or dark brown urine, small red or purple spots on skin, unusual bruising or bleeding Blood clot--pain, swelling, or warmth in the leg, shortness of breath, chest pain Heart attack--pain or tightness in the chest, shoulders, arms, or jaw, nausea, shortness of breath, cold or clammy skin,  feeling faint or lightheaded Heart failure--shortness of breath, swelling of the ankles, feet, or hands, sudden weight gain, unusual weakness or fatigue Increase in blood pressure Infection--fever, chills, cough, sore throat, wounds that don't heal, pain or trouble when passing urine, general feeling of discomfort or being unwell Infusion reactions--chest pain, shortness of breath or trouble breathing, feeling faint or lightheaded Kidney injury--decrease in the amount of urine, swelling of the ankles, hands, or feet Stomach pain that is severe, does not go away, or gets worse Stroke--sudden numbness or weakness of the face, arm, or leg, trouble  speaking, confusion, trouble walking, loss of balance or coordination, dizziness, severe headache, change in vision Sudden and severe headache, confusion, change in vision, seizures, which may be signs of posterior reversible encephalopathy syndrome (PRES) Side effects that usually do not require medical attention (report to your care team if they continue or are bothersome): Back pain Change in taste Diarrhea Dry skin Increased tears Nosebleed This list may not describe all possible side effects. Call your doctor for medical advice about side effects. You may report side effects to FDA at 1-800-FDA-1088. Where should I keep my medication? This medication is given in a hospital or clinic. It will not be stored at home. NOTE: This sheet is a summary. It may not cover all possible information. If you have questions about this medicine, talk to your doctor, pharmacist, or health care provider.  2024 Elsevier/Gold Standard (2021-08-26 00:00:00)      To help prevent nausea and vomiting after your treatment, we encourage you to take your nausea medication as directed.  BELOW ARE SYMPTOMS THAT SHOULD BE REPORTED IMMEDIATELY: *FEVER GREATER THAN 100.4 F (38 C) OR HIGHER *CHILLS OR SWEATING *NAUSEA AND VOMITING THAT IS NOT CONTROLLED WITH YOUR NAUSEA  MEDICATION *UNUSUAL SHORTNESS OF BREATH *UNUSUAL BRUISING OR BLEEDING *URINARY PROBLEMS (pain or burning when urinating, or frequent urination) *BOWEL PROBLEMS (unusual diarrhea, constipation, pain near the anus) TENDERNESS IN MOUTH AND THROAT WITH OR WITHOUT PRESENCE OF ULCERS (sore throat, sores in mouth, or a toothache) UNUSUAL RASH, SWELLING OR PAIN  UNUSUAL VAGINAL DISCHARGE OR ITCHING    Items with * indicate a potential emergency and should be followed up as soon as possible or go to the Emergency Department if any problems should occur.  Please show the CHEMOTHERAPY ALERT CARD or IMMUNOTHERAPY ALERT CARD at check-in to the Emergency Department and triage nurse.  Should you have questions after your visit or need to cancel or reschedule your appointment, please contact [place] [facility] [place] - A DEPT OF [facility] [phone]  and follow the prompts.  Office hours are 8:00 a.m. to 4:30 p.m. Monday - Friday. Please note that voicemails left after 4:00 p.m. may not be returned until the following business day.  We are closed weekends and major holidays. You have access to a nurse at all times for urgent questions. Please call the main number to the clinic [phone] and follow the prompts.  For any non-urgent questions, you may also contact your provider using MyChart. We now offer e-Visits for anyone 26 and older to request care online for non-urgent symptoms. For details visit [email or web address].   Also download the MyChart app! Go to the app store, search MyChart, open the app, select , and log in with your MyChart username and password.

## 2024-03-03 NOTE — Progress Notes (Signed)
 Patient presents today for Folfox, Avastin . Vital signs within parameters for treatment. Labs pending. Patient expresses concerns with having sweats that made him feel bad.   Folfox and Avastin  given today per MD orders. Tolerated infusion without adverse affects. Vital signs stable. No complaints at this time. 5 FU pump placed on patient. RUN noted on screen Verified with patient. Discharged from clinic by wheel chair in stable condition. Alert and oriented x 3. F/U with [facility] as scheduled.

## 2024-03-03 NOTE — Progress Notes (Signed)
 Nutrition Follow-up:   Pt with colon cancer metastatic to liver. He is receiving palliative chemotherapy with Folfox + Beva q14d as well as IV iron  as needed.    11/3 - tx held  Met with patient in infusion. Daughter is present at visit. Patient reports doing well overall. He is pleased with decreased CEA numbers. Asking what his CEA target number would be. Patient says he felt yucky most of last week with fatigue and generally feeling unwell. This improved and felt his usual self on Saturday. Appetite is good. Patient reports eating 3 good meals. Has not been drinking protein shakes recently given good po.    Medications: reviewed   Labs: glucose 136  Anthropometrics: wt 309 lb 3.2 oz today - stable   11/3 - 308 lb 3.3 oz 10/20 - 305 lb 9.6 oz 9/17 - 306 lb 3.5 oz    NUTRITION DIAGNOSIS: Food and nutrition related knowledge deficit improving    INTERVENTION:  Continue including good sources of protein at every meal Defer questions regarding CEA to Dr. Davonna   MONITORING, EVALUATION, GOAL: wt trends, intake    NEXT VISIT: To be scheduled as needed

## 2024-03-05 ENCOUNTER — Inpatient Hospital Stay

## 2024-03-05 VITALS — BP 120/62 | HR 101 | Temp 98.0°F | Resp 20

## 2024-03-05 DIAGNOSIS — Z5111 Encounter for antineoplastic chemotherapy: Secondary | ICD-10-CM | POA: Diagnosis not present

## 2024-03-05 DIAGNOSIS — C189 Malignant neoplasm of colon, unspecified: Secondary | ICD-10-CM

## 2024-03-05 MED ORDER — PEGFILGRASTIM-CBQV 6 MG/0.6ML ~~LOC~~ SOSY
6.0000 mg | PREFILLED_SYRINGE | Freq: Once | SUBCUTANEOUS | Status: AC
  Administered 2024-03-05: 6 mg via SUBCUTANEOUS
  Filled 2024-03-05: qty 0.6

## 2024-03-05 NOTE — Progress Notes (Signed)
 Patient presents today for 5FU pump stop and disconnection after 46 hour continous infusion.   5FU pump deaccessed.  Patients port flushed without difficulty.  Good blood return noted with no bruising or swelling noted at site.  Needle removed intact.  Band aid applied.  VSS with discharge and left in satisfactory condition via wheelchair with no s/s of distress noted.

## 2024-03-10 ENCOUNTER — Inpatient Hospital Stay

## 2024-03-10 ENCOUNTER — Other Ambulatory Visit: Payer: Self-pay | Admitting: Oncology

## 2024-03-10 DIAGNOSIS — C189 Malignant neoplasm of colon, unspecified: Secondary | ICD-10-CM

## 2024-03-12 ENCOUNTER — Inpatient Hospital Stay

## 2024-03-17 ENCOUNTER — Inpatient Hospital Stay

## 2024-03-17 ENCOUNTER — Encounter: Payer: Self-pay | Admitting: Oncology

## 2024-03-17 VITALS — BP 136/81 | HR 83 | Temp 97.0°F | Resp 20

## 2024-03-17 DIAGNOSIS — Z5111 Encounter for antineoplastic chemotherapy: Secondary | ICD-10-CM | POA: Diagnosis not present

## 2024-03-17 DIAGNOSIS — C189 Malignant neoplasm of colon, unspecified: Secondary | ICD-10-CM

## 2024-03-17 LAB — CBC WITH DIFFERENTIAL/PLATELET
Abs Immature Granulocytes: 0.02 K/uL (ref 0.00–0.07)
Basophils Absolute: 0.1 K/uL (ref 0.0–0.1)
Basophils Relative: 2 %
Eosinophils Absolute: 0.2 K/uL (ref 0.0–0.5)
Eosinophils Relative: 5 %
HCT: 41 % (ref 39.0–52.0)
Hemoglobin: 13.3 g/dL (ref 13.0–17.0)
Immature Granulocytes: 1 %
Lymphocytes Relative: 41 %
Lymphs Abs: 1.6 K/uL (ref 0.7–4.0)
MCH: 28 pg (ref 26.0–34.0)
MCHC: 32.4 g/dL (ref 30.0–36.0)
MCV: 86.3 fL (ref 80.0–100.0)
Monocytes Absolute: 0.5 K/uL (ref 0.1–1.0)
Monocytes Relative: 12 %
Neutro Abs: 1.5 K/uL — ABNORMAL LOW (ref 1.7–7.7)
Neutrophils Relative %: 39 %
Platelets: 178 K/uL (ref 150–400)
RBC: 4.75 MIL/uL (ref 4.22–5.81)
RDW: 21.3 % — ABNORMAL HIGH (ref 11.5–15.5)
Smear Review: NORMAL
WBC: 3.8 K/uL — ABNORMAL LOW (ref 4.0–10.5)
nRBC: 0 % (ref 0.0–0.2)

## 2024-03-17 LAB — COMPREHENSIVE METABOLIC PANEL WITH GFR
ALT: 33 U/L (ref 0–44)
AST: 40 U/L (ref 15–41)
Albumin: 4.7 g/dL (ref 3.5–5.0)
Alkaline Phosphatase: 89 U/L (ref 38–126)
Anion gap: 14 (ref 5–15)
BUN: 17 mg/dL (ref 8–23)
CO2: 24 mmol/L (ref 22–32)
Calcium: 9.8 mg/dL (ref 8.9–10.3)
Chloride: 98 mmol/L (ref 98–111)
Creatinine, Ser: 0.84 mg/dL (ref 0.61–1.24)
GFR, Estimated: >60 mL/min (ref >60)
Glucose, Bld: 156 mg/dL — ABNORMAL HIGH (ref 70–99)
Potassium: 3.7 mmol/L (ref 3.5–5.1)
Sodium: 136 mmol/L (ref 135–145)
Total Bilirubin: 0.5 mg/dL (ref 0.0–1.2)
Total Protein: 7.6 g/dL (ref 6.5–8.1)

## 2024-03-17 LAB — MAGNESIUM: Magnesium: 2 mg/dL (ref 1.7–2.4)

## 2024-03-17 MED ORDER — SODIUM CHLORIDE 0.9 % IV SOLN: INTRAVENOUS | Status: DC

## 2024-03-17 MED ORDER — FLUOROURACIL CHEMO INJECTION 2.5 GM/50ML
400.0000 mg/m2 | Freq: Once | INTRAVENOUS | Status: AC
  Administered 2024-03-17: 1000 mg via INTRAVENOUS
  Filled 2024-03-17: qty 20

## 2024-03-17 MED ORDER — SODIUM CHLORIDE 0.9 % IV SOLN
2400.0000 mg/m2 | INTRAVENOUS | Status: DC
  Administered 2024-03-17: 7000 mg via INTRAVENOUS
  Filled 2024-03-17: qty 140

## 2024-03-17 MED ORDER — SODIUM CHLORIDE 0.9 % IV SOLN
5.0000 mg/kg | Freq: Once | INTRAVENOUS | Status: AC
  Administered 2024-03-17: 700 mg via INTRAVENOUS
  Filled 2024-03-17: qty 16

## 2024-03-17 MED ORDER — LEUCOVORIN CALCIUM INJECTION 350 MG
400.0000 mg/m2 | Freq: Once | INTRAVENOUS | Status: AC
  Administered 2024-03-17: 1076 mg via INTRAVENOUS
  Filled 2024-03-17: qty 53.8

## 2024-03-17 MED ORDER — PALONOSETRON HCL INJECTION 0.25 MG/5ML
0.2500 mg | Freq: Once | INTRAVENOUS | Status: AC
  Administered 2024-03-17: 0.25 mg via INTRAVENOUS
  Filled 2024-03-17: qty 5

## 2024-03-17 MED ORDER — DEXAMETHASONE SOD PHOSPHATE PF 10 MG/ML IJ SOLN
10.0000 mg | Freq: Once | INTRAMUSCULAR | Status: AC
  Administered 2024-03-17: 10 mg via INTRAVENOUS

## 2024-03-17 MED ORDER — OXALIPLATIN CHEMO INJECTION 100 MG/20ML
85.0000 mg/m2 | Freq: Once | INTRAVENOUS | Status: AC
  Administered 2024-03-17: 225 mg via INTRAVENOUS
  Filled 2024-03-17: qty 40

## 2024-03-17 MED ORDER — DEXTROSE 5 % IV SOLN: INTRAVENOUS | Status: DC

## 2024-03-17 NOTE — Patient Instructions (Signed)
 [facility] [place] - A DEPT OF [facility]  Discharge Instructions: Thank you for choosing [facility] to provide your oncology and hematology care.  If you have a lab appointment with the Cancer Center - please note that after April 8th, 2024, all labs will be drawn in the cancer center.  You do not have to check in or register with the main entrance as you have in the past but will complete your check-in in the cancer center.  Wear comfortable clothing and clothing appropriate for easy access to any Portacath or PICC line.   We strive to give you quality time with your provider. You may need to reschedule your appointment if you arrive late (15 or more minutes).  Arriving late affects you and other patients whose appointments are after yours.  Also, if you miss three or more appointments without notifying the office, you may be dismissed from the clinic at the provider's discretion.      For prescription refill requests, have your pharmacy contact our office and allow 72 hours for refills to be completed.    Today you received the following chemotherapy and/or immunotherapy agents Avastin  Folfox 29fu pump start      To help prevent nausea and vomiting after your treatment, we encourage you to take your nausea medication as directed.  BELOW ARE SYMPTOMS THAT SHOULD BE REPORTED IMMEDIATELY: *FEVER GREATER THAN 100.4 F (38 C) OR HIGHER *CHILLS OR SWEATING *NAUSEA AND VOMITING THAT IS NOT CONTROLLED WITH YOUR NAUSEA MEDICATION *UNUSUAL SHORTNESS OF BREATH *UNUSUAL BRUISING OR BLEEDING *URINARY PROBLEMS (pain or burning when urinating, or frequent urination) *BOWEL PROBLEMS (unusual diarrhea, constipation, pain near the anus) TENDERNESS IN MOUTH AND THROAT WITH OR WITHOUT PRESENCE OF ULCERS (sore throat, sores in mouth, or a toothache) UNUSUAL RASH, SWELLING OR PAIN  UNUSUAL VAGINAL DISCHARGE OR ITCHING   Items with * indicate a potential emergency and  should be followed up as soon as possible or go to the Emergency Department if any problems should occur.  Please show the CHEMOTHERAPY ALERT CARD or IMMUNOTHERAPY ALERT CARD at check-in to the Emergency Department and triage nurse.  Should you have questions after your visit or need to cancel or reschedule your appointment, please contact [facility] [place] - A DEPT OF [facility] [phone]  and follow the prompts.  Office hours are 8:00 a.m. to 4:30 p.m. Monday - Friday. Please note that voicemails left after 4:00 p.m. may not be returned until the following business day.  We are closed weekends and major holidays. You have access to a nurse at all times for urgent questions. Please call the main number to the clinic [phone] and follow the prompts.  For any non-urgent questions, you may also contact your provider using MyChart. We now offer e-Visits for anyone 73 and older to request care online for non-urgent symptoms. For details visit [email or web address].   Also download the MyChart app! Go to the app store, search MyChart, open the app, select [place], and log in with your MyChart username and password.

## 2024-03-17 NOTE — Progress Notes (Signed)
 Patient presents today for Avastin  FOLFOX infusion with 5FU pump start.  Vital signs and labs within parameters for treatment.  Patient states that he has had noses bleeds over the last week, mostly when he blows his nose.  MD notified, message received form Isaiah Piety RN/Dr. Davonna, okay to proceed with treatment.  Patient advised per MD to use saline nasal spray.  Treatment given today per MD orders.  Tolerated infusion without adverse affects.  Vital signs stable.  5FU pump connected and verified RUN on the screen with the patient.  No complaints at this time.  Discharge from clinic ambulatory in stable condition.  Alert and oriented X 3.  Follow up with [facility] as scheduled.

## 2024-03-18 ENCOUNTER — Other Ambulatory Visit: Payer: Self-pay | Admitting: Oncology

## 2024-03-18 ENCOUNTER — Other Ambulatory Visit (HOSPITAL_BASED_OUTPATIENT_CLINIC_OR_DEPARTMENT_OTHER): Payer: Self-pay

## 2024-03-18 ENCOUNTER — Other Ambulatory Visit: Payer: Self-pay

## 2024-03-18 DIAGNOSIS — C189 Malignant neoplasm of colon, unspecified: Secondary | ICD-10-CM

## 2024-03-18 LAB — CEA: CEA: 8.6 ng/mL — ABNORMAL HIGH (ref 0.0–4.7)

## 2024-03-18 MED ORDER — ONDANSETRON HCL 8 MG PO TABS
8.0000 mg | ORAL_TABLET | Freq: Three times a day (TID) | ORAL | 1 refills | Status: AC | PRN
Start: 2024-03-18 — End: ?
  Filled 2024-03-18: qty 36, 12d supply, fill #0

## 2024-03-18 MED FILL — Gabapentin Cap 300 MG: ORAL | 23 days supply | Qty: 90 | Fill #0 | Status: AC

## 2024-03-19 ENCOUNTER — Inpatient Hospital Stay

## 2024-03-19 ENCOUNTER — Other Ambulatory Visit (HOSPITAL_BASED_OUTPATIENT_CLINIC_OR_DEPARTMENT_OTHER): Payer: Self-pay

## 2024-03-19 VITALS — BP 140/79 | HR 80 | Temp 98.4°F | Resp 20

## 2024-03-19 DIAGNOSIS — C189 Malignant neoplasm of colon, unspecified: Secondary | ICD-10-CM

## 2024-03-19 DIAGNOSIS — Z5111 Encounter for antineoplastic chemotherapy: Secondary | ICD-10-CM | POA: Diagnosis not present

## 2024-03-19 MED ORDER — PEGFILGRASTIM-CBQV 6 MG/0.6ML ~~LOC~~ SOSY
6.0000 mg | PREFILLED_SYRINGE | Freq: Once | SUBCUTANEOUS | Status: AC
  Administered 2024-03-19: 6 mg via SUBCUTANEOUS
  Filled 2024-03-19: qty 0.6

## 2024-03-19 NOTE — Progress Notes (Signed)
Patient presents today for 5FU pump stop and disconnection after 46 hour continous infusion.   5FU pump deaccessed.  Patients port flushed without difficulty.  Good blood return noted with no bruising or swelling noted at site.  Needle removed intact.  Band aid applied.  Udenyca administration without incident; injection site WNL; see MAR for injection details.  Patient tolerated procedure well and without incident.  No questions or complaints noted at this time. VSS with discharge and left in satisfactory condition via wheelchair with no s/s of distress noted.    

## 2024-03-22 ENCOUNTER — Emergency Department (HOSPITAL_COMMUNITY)
Admission: EM | Admit: 2024-03-22 | Discharge: 2024-03-22 | Disposition: A | Discharge status: Home / Self Care | Attending: Emergency Medicine | Admitting: Emergency Medicine

## 2024-03-22 ENCOUNTER — Encounter: Payer: Self-pay | Admitting: Oncology

## 2024-03-22 ENCOUNTER — Encounter (HOSPITAL_COMMUNITY): Payer: Self-pay

## 2024-03-22 DIAGNOSIS — R110 Nausea: Secondary | ICD-10-CM | POA: Insufficient documentation

## 2024-03-22 DIAGNOSIS — Z794 Long term (current) use of insulin: Secondary | ICD-10-CM | POA: Insufficient documentation

## 2024-03-22 DIAGNOSIS — I10 Essential (primary) hypertension: Secondary | ICD-10-CM | POA: Diagnosis not present

## 2024-03-22 DIAGNOSIS — Z7984 Long term (current) use of oral hypoglycemic drugs: Secondary | ICD-10-CM | POA: Diagnosis not present

## 2024-03-22 DIAGNOSIS — E119 Type 2 diabetes mellitus without complications: Secondary | ICD-10-CM | POA: Insufficient documentation

## 2024-03-22 DIAGNOSIS — E871 Hypo-osmolality and hyponatremia: Secondary | ICD-10-CM | POA: Insufficient documentation

## 2024-03-22 DIAGNOSIS — D72829 Elevated white blood cell count, unspecified: Secondary | ICD-10-CM | POA: Insufficient documentation

## 2024-03-22 DIAGNOSIS — R1033 Periumbilical pain: Secondary | ICD-10-CM | POA: Insufficient documentation

## 2024-03-22 DIAGNOSIS — Z8505 Personal history of malignant neoplasm of liver: Secondary | ICD-10-CM | POA: Insufficient documentation

## 2024-03-22 DIAGNOSIS — Z79899 Other long term (current) drug therapy: Secondary | ICD-10-CM | POA: Insufficient documentation

## 2024-03-22 DIAGNOSIS — Z7982 Long term (current) use of aspirin: Secondary | ICD-10-CM | POA: Insufficient documentation

## 2024-03-22 DIAGNOSIS — Z85038 Personal history of other malignant neoplasm of large intestine: Secondary | ICD-10-CM | POA: Insufficient documentation

## 2024-03-22 LAB — COMPREHENSIVE METABOLIC PANEL WITH GFR
ALT: 16 U/L (ref 0–44)
AST: 20 U/L (ref 15–41)
Albumin: 4.3 g/dL (ref 3.5–5.0)
Alkaline Phosphatase: 149 U/L — ABNORMAL HIGH (ref 38–126)
Anion gap: 15 (ref 5–15)
BUN: 34 mg/dL — ABNORMAL HIGH (ref 8–23)
CO2: 20 mmol/L — ABNORMAL LOW (ref 22–32)
Calcium: 9.3 mg/dL (ref 8.9–10.3)
Chloride: 96 mmol/L — ABNORMAL LOW (ref 98–111)
Creatinine, Ser: 1.12 mg/dL (ref 0.61–1.24)
GFR, Estimated: >60 mL/min (ref >60)
Glucose, Bld: 116 mg/dL — ABNORMAL HIGH (ref 70–99)
Potassium: 4 mmol/L (ref 3.5–5.1)
Sodium: 131 mmol/L — ABNORMAL LOW (ref 135–145)
Total Bilirubin: 0.5 mg/dL (ref 0.0–1.2)
Total Protein: 7 g/dL (ref 6.5–8.1)

## 2024-03-22 LAB — CBC WITH DIFFERENTIAL/PLATELET
Abs Immature Granulocytes: 0.11 K/uL — ABNORMAL HIGH (ref 0.00–0.07)
Basophils Absolute: 0.1 K/uL (ref 0.0–0.1)
Basophils Relative: 1 %
Eosinophils Absolute: 0.2 K/uL (ref 0.0–0.5)
Eosinophils Relative: 2 %
HCT: 42.6 % (ref 39.0–52.0)
Hemoglobin: 13.9 g/dL (ref 13.0–17.0)
Immature Granulocytes: 1 %
Lymphocytes Relative: 17 %
Lymphs Abs: 2.5 K/uL (ref 0.7–4.0)
MCH: 27.9 pg (ref 26.0–34.0)
MCHC: 32.6 g/dL (ref 30.0–36.0)
MCV: 85.4 fL (ref 80.0–100.0)
Monocytes Absolute: 0.4 K/uL (ref 0.1–1.0)
Monocytes Relative: 3 %
Neutro Abs: 11.4 K/uL — ABNORMAL HIGH (ref 1.7–7.7)
Neutrophils Relative %: 76 %
Platelets: 180 K/uL (ref 150–400)
RBC: 4.99 MIL/uL (ref 4.22–5.81)
RDW: 20.2 % — ABNORMAL HIGH (ref 11.5–15.5)
WBC: 14.7 K/uL — ABNORMAL HIGH (ref 4.0–10.5)
nRBC: 0 % (ref 0.0–0.2)

## 2024-03-22 LAB — LIPASE, BLOOD: Lipase: 35 U/L (ref 11–51)

## 2024-03-22 MED ORDER — ONDANSETRON HCL 4 MG/2ML IJ SOLN
4.0000 mg | Freq: Once | INTRAMUSCULAR | Status: AC
  Administered 2024-03-22: 4 mg via INTRAVENOUS
  Filled 2024-03-22: qty 2

## 2024-03-22 NOTE — ED Notes (Signed)
Pt informed of need of urine sample 

## 2024-03-22 NOTE — ED Provider Notes (Signed)
 [place] EMERGENCY DEPARTMENT AT [facility] Provider Note   CSN: [id number] Arrival date & time: 03/22/24  1849     Patient presents with: Nausea   Aaron Matthews. is a 63 y.o. male with a history including type 2 diabetes, GERD, hypertension who is currently undergoing chemotherapy secondary to metastatic colon cancer presenting for evaluation of nausea along with cramping and burning in his periumbilical abdomen which he frequently experiences after chemotherapy.  His last treatment occurred 3 days ago.  He has Zofran  for home use but occasionally needs to return to the clinic for IV antiemetics when the Zofran  is not sufficient.  The clinic is closed today therefore he was advised to come here for stronger medication.  He denies fevers or chills, denies abdominal pain distention, chest pain, shortness of breath, no diarrhea, no dysuria.   The history is provided by the patient.       Prior to Admission medications   Medication Sig Start Date End Date Taking? Authorizing Provider  Alpha-Lipoic Acid 600 MG CAPS Take 1 capsule (600 mg total) by mouth 2 (two) times daily. 12/10/23   Bertell Satterfield, MD  alum & mag hydroxide-simeth (MAALOX/MYLANTA) 200-200-20 MG/5ML suspension Take 15 mLs by mouth every 6 (six) hours as needed for indigestion (Mix 1:1 with lidocaine  and swish and spit). 01/21/24   Davonna Siad, MD  aspirin  EC 81 MG tablet Take 81 mg by mouth daily. Swallow whole.    [provider]  atorvastatin  (LIPITOR) 20 MG tablet Take 1 tablet (20 mg total) by mouth daily. 12/10/23     dexamethasone  (DECADRON ) 4 MG tablet Take 2 tablets (8 mg total) by mouth daily. Start the day after chemotherapy for 2 days. Take with food. 12/27/23   Kandala, Hyndavi, MD  empagliflozin  (JARDIANCE ) 25 MG TABS tablet Take 1 tablet (25 mg total) by mouth daily. 12/10/23   Bertell Satterfield, MD  enalapril  (VASOTEC ) 10 MG tablet Take 1 tablet (10 mg total) by mouth 2 (two) times  daily. 12/10/23   Bertell Satterfield, MD  fexofenadine (ALLEGRA) 180 MG tablet Take 180 mg by mouth daily.    [provider]  furosemide  (LASIX ) 40 MG tablet Take 1 tablet (40 mg total) by mouth daily. 01/15/24   Kandala, Hyndavi, MD  gabapentin  (NEURONTIN ) 300 MG capsule Take 1 capsule (300 mg total) by mouth 4 (four) times daily. 03/18/24   Geofm Delon BRAVO, NP  gemfibrozil  (LOPID ) 600 MG tablet Take 1 tablet (600 mg total) by mouth 2 (two) times daily. 12/10/23   Bertell Satterfield, MD  HUMALOG KWIKPEN 100 UNIT/ML KiwkPen Inject 33 Units into the muscle 3 (three) times daily. 04/07/16   [provider]  insulin  degludec (TRESIBA ) 100 UNIT/ML FlexTouch Pen Inject 50 Units into the skin 2 (two) times daily.    [provider]  insulin  degludec (TRESIBA ) 100 UNIT/ML FlexTouch Pen Inject 100 Units into the skin 2 (two) times daily. 12/18/23   Bertell Satterfield, MD  JARDIANCE  25 MG TABS tablet Take 1 tablet (25 mg total) by mouth daily. 01/15/24   Kandala, Hyndavi, MD  lidocaine  (XYLOCAINE ) 2 % solution Use as directed 15 mLs in the mouth or throat every 6 (six) hours as needed for mouth pain (Mix 1:1 with Maalox and Swish and spit). 01/21/24   Kandala, Hyndavi, MD  lidocaine -prilocaine  (EMLA ) cream Apply 1 Application topically as needed (Apply prior to port access). 01/15/24   Kandala, Hyndavi, MD  meclizine  (ANTIVERT ) 25 MG tablet Take  1 tablet (25 mg total) by mouth daily as needed for dizziness. 01/15/24   Kandala, Hyndavi, MD  metFORMIN  (GLUCOPHAGE -XR) 750 MG 24 hr tablet Take 1 tablet (750 mg total) by mouth daily with breakfast. 01/15/24   Davonna Siad, MD  MOUNJARO  2.5 MG/0.5ML Pen Inject 2.5 mg into the skin once a week.    [provider]  ondansetron  (ZOFRAN ) 8 MG tablet Take 1 tablet (8 mg total) by mouth every 8 (eight) hours as needed for nausea or vomiting. Start on the third day after chemotherapy. 03/18/24   Geofm Delon BRAVO, NP  pantoprazole  (PROTONIX ) 40 MG  tablet Take 1 tablet (40 mg total) by mouth daily. 01/15/24   Kandala, Hyndavi, MD  prochlorperazine  (COMPAZINE ) 10 MG tablet Take 1 tablet (10 mg total) by mouth every 6 (six) hours as needed for nausea or vomiting. 12/27/23   Kandala, Hyndavi, MD  tirzepatide  (MOUNJARO ) 2.5 MG/0.5ML Pen Inject 2.5 mg into the skin once a week. 12/10/23   Bertell Satterfield, MD    Allergies: Insulin  glargine and Penicillins    Review of Systems  Constitutional:  Negative for chills and fever.  HENT:  Negative for congestion and sore throat.   Eyes: Negative.   Respiratory:  Negative for chest tightness and shortness of breath.   Cardiovascular:  Negative for chest pain.  Gastrointestinal:  Positive for nausea. Negative for abdominal pain, constipation, diarrhea and vomiting.  Genitourinary: Negative.   Musculoskeletal:  Negative for arthralgias, joint swelling and neck pain.  Skin: Negative.  Negative for rash and wound.  Neurological:  Negative for dizziness, weakness, light-headedness, numbness and headaches.  Psychiatric/Behavioral: Negative.      Updated Vital Signs BP (!) 108/51 (BP Location: Right Arm)   Pulse 86   Temp 97.7 F (36.5 C)   Resp 18   Ht 6' (1.829 m)   Wt (!) 138.5 kg   SpO2 96%   BMI 41.41 kg/m   Physical Exam Vitals and nursing note reviewed.  Constitutional:      Appearance: He is well-developed.  HENT:     Head: Normocephalic and atraumatic.  Eyes:     Conjunctiva/sclera: Conjunctivae normal.  Cardiovascular:     Rate and Rhythm: Normal rate and regular rhythm.     Heart sounds: Normal heart sounds.  Pulmonary:     Effort: Pulmonary effort is normal.     Breath sounds: Normal breath sounds. No wheezing.  Abdominal:     General: Abdomen is protuberant. Bowel sounds are normal. There is no distension.     Palpations: Abdomen is soft.     Tenderness: There is no abdominal tenderness. There is no guarding or rebound.     Comments: Abdominal exam is benign, no acute  abdominal findings present.  Musculoskeletal:        General: Normal range of motion.     Cervical back: Normal range of motion.  Skin:    General: Skin is warm and dry.  Neurological:     Mental Status: He is alert.     (all labs ordered are listed, but only abnormal results are displayed) Labs Reviewed  CBC WITH DIFFERENTIAL/PLATELET - Abnormal; Notable for the following components:      Result Value   WBC 14.7 (*)    RDW 20.2 (*)    Neutro Abs 11.4 (*)    Abs Immature Granulocytes 0.11 (*)    All other components within normal limits  COMPREHENSIVE METABOLIC PANEL WITH GFR - Abnormal; Notable for the  following components:   Sodium 131 (*)    Chloride 96 (*)    CO2 20 (*)    Glucose, Bld 116 (*)    BUN 34 (*)    Alkaline Phosphatase 149 (*)    All other components within normal limits  LIPASE, BLOOD    EKG: None  Radiology: CT CHEST ABDOMEN PELVIS W CONTRAST Result Date: 03/24/2024 CLINICAL DATA:  Metastatic colon cancer to liver undergoing chemotherapy. * Tracking Code: BO * EXAM: CT CHEST, ABDOMEN, AND PELVIS WITH CONTRAST TECHNIQUE: Multidetector CT imaging of the chest, abdomen and pelvis was performed following the standard protocol during bolus administration of intravenous contrast. RADIATION DOSE REDUCTION: This exam was performed according to the departmental dose-optimization program which includes automated exposure control, adjustment of the mA and/or kV according to patient size and/or use of iterative reconstruction technique. CONTRAST:  OMNIPAQUE  IOHEXOL  300 MG/ML  SOLN COMPARISON:  CT chest, abdomen, and pelvis dated 11/29/2023, MRI abdomen dated 11/30/2023 FINDINGS: CT CHEST FINDINGS Cardiovascular: Right chest wall port tip terminates at the superior cavoatrial junction. Normal heart size. No significant pericardial fluid/thickening. Great vessels are normal in course and caliber. No central pulmonary emboli. Coronary artery calcifications and aortic  atherosclerosis. Mediastinum/Nodes: Imaged thyroid gland without nodules meeting criteria for imaging follow-up by size. Normal esophagus. No pathologically enlarged axillary, supraclavicular, mediastinal, or hilar lymph nodes. Lungs/Pleura: The central airways are patent. Scattered punctate calcified granulomata. Bibasilar linear atelectasis/scarring. No pneumothorax. No pleural effusion. Musculoskeletal: No acute or abnormal lytic or blastic osseous lesions. Multilevel degenerative changes of the thoracic spine. CT ABDOMEN PELVIS FINDINGS Hepatobiliary: Interval decreased size of segment 5 lesion measuring 2.4 x 1.7 cm (4:70), previously 3.2 x 2.9 cm. Additional subcentimeter lesions seen on prior MRI are not well appreciated by CT. Irregular hypodensity adjacent to the hilum measures 6.6 x 2.4 cm (4:56). No intra or extrahepatic biliary ductal dilation. Cholecystectomy. Pancreas: No focal lesions or main ductal dilation. Spleen: Unchanged splenomegaly measures 15.6 cm. Adrenals/Urinary Tract: No adrenal nodules. No suspicious renal mass, calculi, or hydronephrosis. Unchanged left upper pole simple cyst. No focal bladder wall thickening. Stomach/Bowel: Normal appearance of the stomach. Circumferential mural thickening of the ileum. No abnormal bowel dilation. Ill-defined intraluminal hyperdensity within the rectum may represent contrast material. Underdistended cecum with irregular mild mural thickening, at the site of known primary colon cancer. Normal appendix. Vascular/Lymphatic: Aortic atherosclerosis. No enlarged abdominal or pelvic lymph nodes. Reproductive: Prostate is unremarkable. Other: Mild mesenteric stranding and small volume ascites. No free air or fluid collection. Again seen are ovoid calcified structures located between the duodenum and liver, which may reflect sequela of prior infection/inflammation. Musculoskeletal: No acute or abnormal lytic or blastic osseous findings. Postsurgical changes of  left hip arthroplasty. Hardware appears intact and well seated. IMPRESSION: 1. Decreased size of segment 5 hepatic lesion. Additional subcentimeter lesions seen on prior MRI are not well appreciated by CT. 2. Interval decreased hepatic density, in keeping with steatosis. More pronounced irregular hypodensity adjacent to the hilum, which may represent focal fatty infiltration. Consider further evaluation with MRI abdomen. 3. Circumferential mural thickening of the ileum, in keeping with enteritis, which may be infectious, inflammatory, or treatment-related. 4. Underdistended cecum with irregular mild mural thickening, at the site of known primary colon cancer. 5. No evidence of metastatic disease in the chest. 6. Unchanged splenomegaly. 7.  Aortic Atherosclerosis (ICD10-I70.0). Electronically Signed   By: Limin  Xu M.D.   On: 03/24/2024 14:46     Procedures   Medications  Ordered in the ED  ondansetron  (ZOFRAN ) injection 4 mg (4 mg Intravenous Given 03/22/24 2016)                                    Medical Decision Making Patient presenting with nausea consistent with his typical post chemotherapy infusion side effects with oral Zofran  not helping him at home.  He was given an IV dose of Zofran  here which was effective, he was able to tolerate p.o. intake and had no complaints of any discomfort at time of discharge and was motivated for discharge.  He does have close follow-up with his oncologist this coming week, return precautions were outlined.  His labs are reassuring, although he does have a leukocytosis of 14.7.  He has been afebrile.  Given his benign exam no further workup felt needed at this time.  Amount and/or Complexity of Data Reviewed Labs: ordered.    Details: Labs including a normal lipase, his CMET is relatively normal, he does have a mild hyponatremia at 131 and as mentioned above a WBC count of 14.7.        Final diagnoses:  Nausea    ED Discharge Orders     None           Birdena Mliss RIGGERS 03/24/24 1816    Cleotilde Rogue, MD 03/26/24 2033

## 2024-03-22 NOTE — ED Triage Notes (Signed)
 Pt comes in for nausea, abd pain. Pain is cramping and burning.   Pt has colon cancer and liver cancer. Pt is currently on chemo txt. Pt completed his txt Monday - Wednesday.  Last BM 03/21/2024. A&Ox4.

## 2024-03-22 NOTE — Discharge Instructions (Signed)
 Your lab test tonight are reassuring.  As discussed your white blood cell count is a little elevated at 14.7, however your exam is reassuring and since your nausea has resolved, you do not need any further testing tonight.  As discussed, return here for any return of symptoms or any new concerns.

## 2024-03-24 ENCOUNTER — Inpatient Hospital Stay: Admitting: Oncology

## 2024-03-24 ENCOUNTER — Inpatient Hospital Stay

## 2024-03-24 ENCOUNTER — Telehealth: Payer: Self-pay | Admitting: *Deleted

## 2024-03-24 ENCOUNTER — Ambulatory Visit (HOSPITAL_COMMUNITY)
Admission: RE | Admit: 2024-03-24 | Discharge: 2024-03-24 | Disposition: A | Discharge status: Home / Self Care | Source: Ambulatory Visit | Attending: Oncology | Admitting: Oncology

## 2024-03-24 DIAGNOSIS — C189 Malignant neoplasm of colon, unspecified: Secondary | ICD-10-CM | POA: Diagnosis present

## 2024-03-24 MED ORDER — IOHEXOL 9 MG/ML PO SOLN
ORAL | Status: AC
  Filled 2024-03-24: qty 1000

## 2024-03-24 MED ORDER — IOHEXOL 300 MG/ML  SOLN
100.0000 mL | Freq: Once | INTRAMUSCULAR | Status: AC | PRN
  Administered 2024-03-24: 100 mL via INTRAVENOUS

## 2024-03-24 NOTE — Telephone Encounter (Signed)
 Patient came to clinic with continued complaints of mid/upper abdominal pain associated with burning.  Had been to the ER 11/29.  Was for a CT CAP today and was encouraged to complete scan and would review recommendations with Dr. Davonna once resulted.

## 2024-03-26 ENCOUNTER — Inpatient Hospital Stay

## 2024-03-27 ENCOUNTER — Inpatient Hospital Stay: Attending: Oncology | Admitting: Oncology

## 2024-03-27 ENCOUNTER — Inpatient Hospital Stay

## 2024-03-27 ENCOUNTER — Encounter: Payer: Self-pay | Admitting: Oncology

## 2024-03-27 VITALS — BP 134/80 | HR 76 | Temp 98.1°F | Resp 18 | Wt 292.3 lb

## 2024-03-27 DIAGNOSIS — C787 Secondary malignant neoplasm of liver and intrahepatic bile duct: Secondary | ICD-10-CM

## 2024-03-27 DIAGNOSIS — R110 Nausea: Secondary | ICD-10-CM

## 2024-03-27 DIAGNOSIS — R112 Nausea with vomiting, unspecified: Secondary | ICD-10-CM | POA: Insufficient documentation

## 2024-03-27 DIAGNOSIS — C180 Malignant neoplasm of cecum: Secondary | ICD-10-CM | POA: Insufficient documentation

## 2024-03-27 DIAGNOSIS — E1165 Type 2 diabetes mellitus with hyperglycemia: Secondary | ICD-10-CM | POA: Diagnosis not present

## 2024-03-27 DIAGNOSIS — G620 Drug-induced polyneuropathy: Secondary | ICD-10-CM | POA: Insufficient documentation

## 2024-03-27 DIAGNOSIS — D72829 Elevated white blood cell count, unspecified: Secondary | ICD-10-CM | POA: Diagnosis not present

## 2024-03-27 DIAGNOSIS — K1230 Oral mucositis (ulcerative), unspecified: Secondary | ICD-10-CM | POA: Diagnosis not present

## 2024-03-27 DIAGNOSIS — D509 Iron deficiency anemia, unspecified: Secondary | ICD-10-CM | POA: Insufficient documentation

## 2024-03-27 DIAGNOSIS — T451X5A Adverse effect of antineoplastic and immunosuppressive drugs, initial encounter: Secondary | ICD-10-CM | POA: Insufficient documentation

## 2024-03-27 DIAGNOSIS — Z5111 Encounter for antineoplastic chemotherapy: Secondary | ICD-10-CM | POA: Diagnosis present

## 2024-03-27 DIAGNOSIS — Z5189 Encounter for other specified aftercare: Secondary | ICD-10-CM | POA: Insufficient documentation

## 2024-03-27 DIAGNOSIS — Z7189 Other specified counseling: Secondary | ICD-10-CM

## 2024-03-27 DIAGNOSIS — C189 Malignant neoplasm of colon, unspecified: Secondary | ICD-10-CM

## 2024-03-27 LAB — CBC WITH DIFFERENTIAL/PLATELET
Abs Immature Granulocytes: 0.01 K/uL (ref 0.00–0.07)
Basophils Absolute: 0 K/uL (ref 0.0–0.1)
Basophils Relative: 1 %
Eosinophils Absolute: 0.1 K/uL (ref 0.0–0.5)
Eosinophils Relative: 3 %
HCT: 36.3 % — ABNORMAL LOW (ref 39.0–52.0)
Hemoglobin: 11.6 g/dL — ABNORMAL LOW (ref 13.0–17.0)
Immature Granulocytes: 0 %
Lymphocytes Relative: 28 %
Lymphs Abs: 1 K/uL (ref 0.7–4.0)
MCH: 27.6 pg (ref 26.0–34.0)
MCHC: 32 g/dL (ref 30.0–36.0)
MCV: 86.4 fL (ref 80.0–100.0)
Monocytes Absolute: 0.3 K/uL (ref 0.1–1.0)
Monocytes Relative: 8 %
Neutro Abs: 2.2 K/uL (ref 1.7–7.7)
Neutrophils Relative %: 60 %
Platelets: 154 K/uL (ref 150–400)
RBC: 4.2 MIL/uL — ABNORMAL LOW (ref 4.22–5.81)
RDW: 19.1 % — ABNORMAL HIGH (ref 11.5–15.5)
WBC: 3.7 K/uL — ABNORMAL LOW (ref 4.0–10.5)
nRBC: 0 % (ref 0.0–0.2)

## 2024-03-27 LAB — SAMPLE TO BLOOD BANK

## 2024-03-27 MED ORDER — DEXAMETHASONE SODIUM PHOSPHATE 10 MG/ML IJ SOLN
10.0000 mg | Freq: Once | INTRAMUSCULAR | Status: AC
  Administered 2024-03-27: 10 mg via INTRAVENOUS

## 2024-03-27 MED ORDER — ONDANSETRON HCL 4 MG/2ML IJ SOLN: 8.0000 mg | Freq: Once | INTRAMUSCULAR | Status: DC

## 2024-03-27 MED ORDER — SODIUM CHLORIDE 0.9 % IV SOLN: Freq: Once | INTRAVENOUS | Status: AC

## 2024-03-27 MED ORDER — ONDANSETRON HCL 4 MG/2ML IJ SOLN
8.0000 mg | Freq: Once | INTRAMUSCULAR | Status: AC
  Administered 2024-03-27: 8 mg via INTRAVENOUS
  Filled 2024-03-27: qty 4

## 2024-03-27 MED ORDER — DEXAMETHASONE SOD PHOSPHATE PF 10 MG/ML IJ SOLN: 10.0000 mg | Freq: Once | INTRAMUSCULAR | Status: DC

## 2024-03-27 NOTE — Progress Notes (Signed)
 Per NP to give pt 500mL NS over 30 minutes, Zofran  8 mg IV, and decadron  10 mg IV. Pt tolerated fluids, steroids, and Zofran  well with no signs of complications. VSS. All follow ups as scheduled.   Eesa Justiss

## 2024-03-27 NOTE — Progress Notes (Unsigned)
 Patient Care Team: Davonna Siad, MD as PCP - General (Oncology) Davonna Siad, MD as Medical Oncologist (Medical Oncology) Celestia Joesph SQUIBB, RN as Oncology Nurse Navigator (Medical Oncology)  Clinic Day:  03/27/2024  Referring physician: Bertell Satterfield, MD   CHIEF COMPLAINT:  CC: Metastatic colon carcinoma   ASSESSMENT & PLAN:   Assessment & Plan: Aaron Matthews.  is a 63 y.o. male with metastatic colon carcinoma  Assessment and Plan Assessment & Plan Metastatic colorectal cancer Stage IV metastatic colon adenocarcinoma-metastatic to liver Oncology history below Biopsy-proven from the liver biopsy.  MMR proficient. [facility] NGS: KRAS exon 2 mutation present, PD-L1: 0, HER2 negative  -12/31/2023: Started on FOLFOX +Avastin .  Added Avastin  with second cycle - He is status post 6 cycles of FOLFOX plus Avastin  last given on 03/17/2024. -Seen in the ED on 03/22/2024-abdominal cramping, nausea, vomiting refractory to p.o. Zofran .  - He was given IV Zofran  with improvement of his symptoms. -We reviewed most recent CT scan of his abdomen which shows significant improvement with decreased hepatic lesions without evidence of metastatic disease in the chest.  CEA continues to trend down and is currently 8.6. -Patient would like to push out next chemo until after Christmas. -Will go ahead and push out his treatments for 3 weeks so that he can recover. - Will give him a liter of normal saline, dexamethasone  and IV Zofran . -RTC on 04/21/2024 for labs, FOLFOX plus Avastin  and on 04/23/2024 for pump DC.  He will then see Dr. Davonna back on 05/05/2024 prior to next cycle.  Metastasis to liver Biopsy-proven as below Radiation not an option.  Due to the extent of the disease surgical resection was not an option  - Chemotherapy as above  Chemotherapy-induced nausea Nausea effectively managed with Zofran  for previous cycles but unfortunately during his most recent cycle, Zofran   and was not effective. Patient was seen in the hospital and given IV Zofran  which did seem to help.  - Continue preemptive use of Zofran  for nausea management.  Chemotherapy-induced oral mucositis Oral mucositis present, with relief from magic mouthwash.  - Continue use of magic mouthwash. - Monitor oral mucositis and consider reducing chemotherapy dose if symptoms worsen.  Chemotherapy-induced peripheral neuropathy Peripheral neuropathy symptoms present but improved from prior.  Leukopenia secondary to chemotherapy Leukopenia present but expected due to chemotherapy. White blood cell count low but not prohibitive for continuing chemotherapy.  - Administer chemotherapy as planned. - Growth factors support added.  Iron  deficiency anemia Iron  deficiency anemia. Hemoglobin levels improved significantly with IV iron  replacement. Patient received 40 mg IV Venofer  on 01/21/2024. Repeat iron  levels from 02/25/2024 showed iron  saturation 14 percent with elevated TIBC. Ferritin improved to 104.  - Repeat iron  labs in a few weeks to assess treatment efficacy.  Type 2 diabetes mellitus, poorly controlled Diabetes poorly controlled. Blood sugar levels improved slightly.  - Emphasize dietary management to control blood sugar levels. - Monitor kidney function and protein levels in urine. -Continue to follow-up with primary care  Leukocytosis Patient had elevated white count on 03/22/2024 when he was seen in the ED for nausea, vomiting and diarrhea 14.7. Will repeat CBC today to make sure he does not need any antibiotics.  We discussed that leukocytosis likely secondary to growth factor support but given he was symptomatic, we will make sure it is trending down.  From Repeat CBC from 03/27/2024 shows improvement of his white count to 3.7.  The patient understands the plans discussed today and is in  agreement with them.  He knows to contact our office if he develops concerns prior to his next  appointment.  I spent 25 minutes dedicated to the care of this patient (face-to-face and non-face-to-face) on the date of the encounter to include what is described in the assessment and plan.    Delon FORBES Hope, NP  [facility] [place] - A DEPT OF [facility] [address] [place] [address] Dept: [phone] Dept Fax: [phone]   No orders of the defined types were placed in this encounter.    ONCOLOGY HISTORY:   Diagnosis: Metastatic colon adenocarcinoma   -11/28/2023: Cecal mass biopsy: Invasive moderately differentiated adenocarcinoma. -11/28/2023: Colonoscopy: Ulcerated tumor in the cecum.  Markedly redundant and elongated colon. -11/29/2023: CT RJE:Uyzmz is a single ill-defined hypoattenuating 2.4 x 2.7 cm lesion in the right hepatic lobe, segment 5, which is incompletely characterized on the current exam. Further evaluation with nonemergent MRI abdomen as per liver mass protocol is recommended. There are multiple, sub 4 mm, solid, calcified and noncalcified nodules throughout bilateral lungs. Otherwise no metastatic disease identified within the chest, abdomen or pelvis. -11/30/2023: MRI liver:There are at least 5 hepatic lesions, compatible with metastases. The dominant lesion is in the right hepatic lobe, segment 5 measuring 2.9 x 3.2 cm. -12/11/2023: Liver, biopsy, right lobe, mass :       Metastatic adenocarcinoma with necrosis, consistent with colorectal primary  -Evaluated by radiation oncology for liver metastasis, radiation ordered option.  Discussed at tumor board and considering the extent of disease, surgery not an option for the patient. -12/28/2023: IR guided port placement - 12/31/2023-Current: FOLFOX + Bevacizumab .  Avastin  added at cycle 2 - 01/02/2024 [facility] NGS: KRAS exon 2 mutation: Positive, MSI-stable, MMR: Proficient, PD-L1: Negative, TPS:0%  - BRAF, NTRK 1/2/3, RET, EGFR, HER2, NF1, NRAS,  PIK3CA,POLE: Negative  -Pathogenic variants: AMER 1, APC, AXIN1, KRAS, TP53  Current Treatment:  FOLFOX + Bevacizumab   INTERVAL HISTORY:  Discussed the use of AI scribe software for clinical note transcription with the patient, who gave verbal consent to proceed.  History of Present Illness Aaron Lo. is a 63 year old male with colorectal cancer who presents   Patient presents today with his daughter and wife.  He is status post 6 cycles of FOLFOX plus Avastin  last given on 03/17/2024 with pump removal on 03/19/2024.  Reportedly, developed nausea, vomiting, diarrhea and weakness so he was seen in the emergency room on 03/22/2024.  He was given IV Zofran  with improvement of his symptoms.  Patient had a CT scan of his abdomen on 03/24/2024 which showed decreased segment 5 hepatic lesion but additional lesions were hard to see  Overall, he reports he has not been doing well.  He has improved nausea but still feels very weak.  Diarrhea has also improved but he feels dehydrated and tired.  He is asking if he can push out his chemotherapy beyond Christmas  you gain his strength back.  Reports very low appetite and low energy levels.  He is trying to drink as much fluids as he can but he is unable to tolerate food.  He has lost 13 pounds since last week.  He is having intermittent abdominal pain but this has improved.  Has intermittent nosebleeds.  Has dizziness when he stands.  Has tingling in his hands and feet.  Reports Zofran  has not been working and he is wondering if there is something stronger  he may use with his treatment next time.  Denies any sick contacts.  Wife and daughter are both okay.   I have reviewed the past medical history, past surgical history, social history and family history with the patient and they are unchanged from previous note.  ALLERGIES:  is allergic to insulin  glargine and penicillins.  MEDICATIONS:  Current Outpatient Medications  Medication Sig  Dispense Refill   Alpha-Lipoic Acid 600 MG CAPS Take 1 capsule (600 mg total) by mouth 2 (two) times daily. 60 capsule 11   alum & mag hydroxide-simeth (MAALOX/MYLANTA) 200-200-20 MG/5ML suspension Take 15 mLs by mouth every 6 (six) hours as needed for indigestion (Mix 1:1 with lidocaine  and swish and spit). 355 mL 0   aspirin  EC 81 MG tablet Take 81 mg by mouth daily. Swallow whole.     atorvastatin  (LIPITOR) 20 MG tablet Take 1 tablet (20 mg total) by mouth daily. 90 tablet 3   dexamethasone  (DECADRON ) 4 MG tablet Take 2 tablets (8 mg total) by mouth daily. Start the day after chemotherapy for 2 days. Take with food. 30 tablet 1   empagliflozin  (JARDIANCE ) 25 MG TABS tablet Take 1 tablet (25 mg total) by mouth daily. 90 tablet 3   enalapril  (VASOTEC ) 10 MG tablet Take 1 tablet (10 mg total) by mouth 2 (two) times daily. 180 tablet 3   fexofenadine (ALLEGRA) 180 MG tablet Take 180 mg by mouth daily.     furosemide  (LASIX ) 40 MG tablet Take 1 tablet (40 mg total) by mouth daily. 30 tablet 3   gabapentin  (NEURONTIN ) 300 MG capsule Take 1 capsule (300 mg total) by mouth 4 (four) times daily. 90 capsule 0   gemfibrozil  (LOPID ) 600 MG tablet Take 1 tablet (600 mg total) by mouth 2 (two) times daily. 180 tablet 3   HUMALOG KWIKPEN 100 UNIT/ML KiwkPen Inject 33 Units into the muscle 3 (three) times daily.     insulin  degludec (TRESIBA ) 100 UNIT/ML FlexTouch Pen Inject 50 Units into the skin 2 (two) times daily.     insulin  degludec (TRESIBA ) 100 UNIT/ML FlexTouch Pen Inject 100 Units into the skin 2 (two) times daily. 90 mL 3   JARDIANCE  25 MG TABS tablet Take 1 tablet (25 mg total) by mouth daily. 30 tablet 0   lidocaine  (XYLOCAINE ) 2 % solution Use as directed 15 mLs in the mouth or throat every 6 (six) hours as needed for mouth pain (Mix 1:1 with Maalox and Swish and spit). 200 mL 2   lidocaine -prilocaine  (EMLA ) cream Apply 1 Application topically as needed (Apply prior to port access). 30 g 3    meclizine  (ANTIVERT ) 25 MG tablet Take 1 tablet (25 mg total) by mouth daily as needed for dizziness. 30 tablet 0   metFORMIN  (GLUCOPHAGE -XR) 750 MG 24 hr tablet Take 1 tablet (750 mg total) by mouth daily with breakfast. 30 tablet 0   MOUNJARO  2.5 MG/0.5ML Pen Inject 2.5 mg into the skin once a week.     ondansetron  (ZOFRAN ) 8 MG tablet Take 1 tablet (8 mg total) by mouth every 8 (eight) hours as needed for nausea or vomiting. Start on the third day after chemotherapy. 36 tablet 1   pantoprazole  (PROTONIX ) 40 MG tablet Take 1 tablet (40 mg total) by mouth daily. 30 tablet 0   prochlorperazine  (COMPAZINE ) 10 MG tablet Take 1 tablet (10 mg total) by mouth every 6 (six) hours as needed for nausea or vomiting. 30 tablet 1   tirzepatide  (MOUNJARO ) 2.5 MG/0.5ML  Pen Inject 2.5 mg into the skin once a week. 4 mL 1   No current facility-administered medications for this visit.   Facility-Administered Medications Ordered in Other Visits  Medication Dose Route Frequency Provider Last Rate Last Admin   0.9 %  sodium chloride  infusion   Intravenous Continuous Kandala, Hyndavi, MD   Stopped at 01/28/24 1113   dextrose  5 % solution   Intravenous Continuous Kandala, Hyndavi, MD   Stopped at 01/28/24 1404    REVIEW OF SYSTEMS:   Review of Systems  Constitutional:  Positive for malaise/fatigue.  Respiratory:  Positive for shortness of breath.   Gastrointestinal:  Positive for abdominal pain, diarrhea, nausea and vomiting.  Neurological:  Positive for dizziness, weakness and headaches.  Psychiatric/Behavioral:  The patient is nervous/anxious.      VITALS:  Blood pressure 134/80, pulse 76, temperature 98.1 F (36.7 C), temperature source Oral, resp. rate 18, weight 292 lb 4.8 oz (132.6 kg), SpO2 96%.  Wt Readings from Last 3 Encounters:  03/27/24 292 lb 4.8 oz (132.6 kg)  03/22/24 (!) 305 lb 5.4 oz (138.5 kg)  03/17/24 (!) 305 lb 5.4 oz (138.5 kg)    Body mass index is 39.64 kg/m.  Performance  status (ECOG): 1 - Symptomatic but completely ambulatory  PHYSICAL EXAM:   Physical Exam Constitutional:      Appearance: Normal appearance.  HENT:     Head: Normocephalic and atraumatic.  Eyes:     Pupils: Pupils are equal, round, and reactive to light.  Cardiovascular:     Rate and Rhythm: Normal rate and regular rhythm.     Heart sounds: Normal heart sounds. No murmur heard. Pulmonary:     Effort: Pulmonary effort is normal.     Breath sounds: Normal breath sounds. No wheezing.  Abdominal:     General: Bowel sounds are normal. There is no distension.     Palpations: Abdomen is soft.     Tenderness: There is no abdominal tenderness.  Musculoskeletal:        General: Normal range of motion.     Cervical back: Normal range of motion.  Skin:    General: Skin is warm and dry.     Findings: No rash.  Neurological:     Mental Status: He is alert and oriented to person, place, and time.  Psychiatric:        Judgment: Judgment normal.      LABORATORY DATA:  I have reviewed the data as listed    Component Value Date/Time   NA 131 (L) 03/22/2024 1955   K 4.0 03/22/2024 1955   CL 96 (L) 03/22/2024 1955   CO2 20 (L) 03/22/2024 1955   GLUCOSE 116 (H) 03/22/2024 1955   BUN 34 (H) 03/22/2024 1955   CREATININE 1.12 03/22/2024 1955   CALCIUM  9.3 03/22/2024 1955   PROT 7.0 03/22/2024 1955   ALBUMIN 4.3 03/22/2024 1955   AST 20 03/22/2024 1955   ALT 16 03/22/2024 1955   ALKPHOS 149 (H) 03/22/2024 1955   BILITOT 0.5 03/22/2024 1955   GFRNONAA >60 03/22/2024 1955   GFRAA >60 04/16/2016 1019    Lab Results  Component Value Date   WBC 14.7 (H) 03/22/2024   NEUTROABS 11.4 (H) 03/22/2024   HGB 13.9 03/22/2024   HCT 42.6 03/22/2024   MCV 85.4 03/22/2024   PLT 180 03/22/2024      Chemistry      Component Value Date/Time   NA 131 (L) 03/22/2024 1955   K 4.0  03/22/2024 1955   CL 96 (L) 03/22/2024 1955   CO2 20 (L) 03/22/2024 1955   BUN 34 (H) 03/22/2024 1955    CREATININE 1.12 03/22/2024 1955      Component Value Date/Time   CALCIUM  9.3 03/22/2024 1955   ALKPHOS 149 (H) 03/22/2024 1955   AST 20 03/22/2024 1955   ALT 16 03/22/2024 1955   BILITOT 0.5 03/22/2024 1955      Latest Reference Range & Units 12/19/23 12:02  Iron  45 - 182 ug/dL 11 (L)  UIBC ug/dL 368  TIBC 749 - 549 ug/dL 357 (H)  Saturation Ratios 17.9 - 39.5 % 2 (L)  Ferritin 24 - 336 ng/mL 3 (L)  (L): Data is abnormally low (H): Data is abnormally high   Latest Reference Range & Units 01/14/24 10:04  CEA 0.0 - 4.7 ng/mL 87.2 (H)  (H): Data is abnormally high  RADIOGRAPHIC STUDIES: I have personally reviewed the radiological images as listed and agreed with the findings in the report.  None new to review

## 2024-03-28 ENCOUNTER — Other Ambulatory Visit: Payer: Self-pay

## 2024-03-28 ENCOUNTER — Encounter: Payer: Self-pay | Admitting: Oncology

## 2024-03-31 ENCOUNTER — Telehealth: Payer: Self-pay | Admitting: *Deleted

## 2024-03-31 NOTE — Telephone Encounter (Signed)
 Patient called with continued nasal congestion, despite Afrin.  Per Johnston Police, NP recommended phenylephrine  verses Sudafed as it has less BP effects. If he is still congested would also recommend ocean nasal spray.   Verbalized understanding.  Advised to minimize use of Afrin and alternate with the saline spray.

## 2024-04-01 ENCOUNTER — Inpatient Hospital Stay: Admitting: Oncology

## 2024-04-01 ENCOUNTER — Inpatient Hospital Stay: Attending: Oncology

## 2024-04-01 ENCOUNTER — Inpatient Hospital Stay

## 2024-04-03 ENCOUNTER — Other Ambulatory Visit (HOSPITAL_BASED_OUTPATIENT_CLINIC_OR_DEPARTMENT_OTHER): Payer: Self-pay

## 2024-04-03 ENCOUNTER — Inpatient Hospital Stay

## 2024-04-03 ENCOUNTER — Encounter: Payer: Self-pay | Admitting: Oncology

## 2024-04-04 ENCOUNTER — Other Ambulatory Visit (HOSPITAL_BASED_OUTPATIENT_CLINIC_OR_DEPARTMENT_OTHER): Payer: Self-pay

## 2024-04-07 ENCOUNTER — Other Ambulatory Visit: Payer: Self-pay | Admitting: Oncology

## 2024-04-07 ENCOUNTER — Other Ambulatory Visit (HOSPITAL_BASED_OUTPATIENT_CLINIC_OR_DEPARTMENT_OTHER): Payer: Self-pay

## 2024-04-08 ENCOUNTER — Encounter: Payer: Self-pay | Admitting: Oncology

## 2024-04-08 ENCOUNTER — Other Ambulatory Visit (HOSPITAL_BASED_OUTPATIENT_CLINIC_OR_DEPARTMENT_OTHER): Payer: Self-pay

## 2024-04-08 MED ORDER — INSULIN LISPRO (1 UNIT DIAL) 100 UNIT/ML (KWIKPEN)
40.0000 [IU] | PEN_INJECTOR | Freq: Three times a day (TID) | SUBCUTANEOUS | 3 refills | Status: AC
  Filled 2024-04-08: qty 30, 25d supply, fill #0
  Filled 2024-05-14: qty 30, 25d supply, fill #1

## 2024-04-14 ENCOUNTER — Other Ambulatory Visit (HOSPITAL_BASED_OUTPATIENT_CLINIC_OR_DEPARTMENT_OTHER): Payer: Self-pay

## 2024-04-14 ENCOUNTER — Encounter: Payer: Self-pay | Admitting: Oncology

## 2024-04-14 ENCOUNTER — Other Ambulatory Visit: Payer: Self-pay | Admitting: Oncology

## 2024-04-15 ENCOUNTER — Other Ambulatory Visit (HOSPITAL_BASED_OUTPATIENT_CLINIC_OR_DEPARTMENT_OTHER): Payer: Self-pay

## 2024-04-15 MED ORDER — GABAPENTIN 300 MG PO CAPS
300.0000 mg | ORAL_CAPSULE | Freq: Four times a day (QID) | ORAL | 0 refills | Status: DC
  Filled 2024-04-15: qty 90, 23d supply, fill #0

## 2024-04-21 ENCOUNTER — Other Ambulatory Visit (HOSPITAL_BASED_OUTPATIENT_CLINIC_OR_DEPARTMENT_OTHER): Payer: Self-pay

## 2024-04-21 ENCOUNTER — Telehealth: Payer: Self-pay | Admitting: *Deleted

## 2024-04-21 ENCOUNTER — Inpatient Hospital Stay

## 2024-04-21 ENCOUNTER — Encounter: Payer: Self-pay | Admitting: *Deleted

## 2024-04-21 ENCOUNTER — Other Ambulatory Visit: Payer: Self-pay | Admitting: *Deleted

## 2024-04-21 VITALS — BP 140/85 | HR 91 | Temp 97.3°F | Resp 20

## 2024-04-21 DIAGNOSIS — C189 Malignant neoplasm of colon, unspecified: Secondary | ICD-10-CM

## 2024-04-21 DIAGNOSIS — Z5111 Encounter for antineoplastic chemotherapy: Secondary | ICD-10-CM | POA: Diagnosis not present

## 2024-04-21 LAB — CBC WITH DIFFERENTIAL/PLATELET
Abs Immature Granulocytes: 0.02 K/uL (ref 0.00–0.07)
Basophils Absolute: 0.1 K/uL (ref 0.0–0.1)
Basophils Relative: 2 %
Eosinophils Absolute: 0.3 K/uL (ref 0.0–0.5)
Eosinophils Relative: 5 %
HCT: 43 % (ref 39.0–52.0)
Hemoglobin: 13.7 g/dL (ref 13.0–17.0)
Immature Granulocytes: 0 %
Lymphocytes Relative: 28 %
Lymphs Abs: 1.5 K/uL (ref 0.7–4.0)
MCH: 29 pg (ref 26.0–34.0)
MCHC: 31.9 g/dL (ref 30.0–36.0)
MCV: 90.9 fL (ref 80.0–100.0)
Monocytes Absolute: 0.7 K/uL (ref 0.1–1.0)
Monocytes Relative: 14 %
Neutro Abs: 2.7 K/uL (ref 1.7–7.7)
Neutrophils Relative %: 51 %
Platelets: 241 K/uL (ref 150–400)
RBC: 4.73 MIL/uL (ref 4.22–5.81)
RDW: 18.4 % — ABNORMAL HIGH (ref 11.5–15.5)
WBC: 5.3 K/uL (ref 4.0–10.5)
nRBC: 0 % (ref 0.0–0.2)

## 2024-04-21 LAB — URINALYSIS, DIPSTICK ONLY
Bilirubin Urine: NEGATIVE
Glucose, UA: >=500 mg/dL — AB
Hgb urine dipstick: NEGATIVE
Ketones, ur: NEGATIVE mg/dL
Nitrite: NEGATIVE
Protein, ur: NEGATIVE mg/dL
Specific Gravity, Urine: 1.02 (ref 1.005–1.030)
pH: 5 (ref 5.0–8.0)

## 2024-04-21 LAB — COMPREHENSIVE METABOLIC PANEL WITH GFR
ALT: 28 U/L (ref 0–44)
AST: 32 U/L (ref 15–41)
Albumin: 4.7 g/dL (ref 3.5–5.0)
Alkaline Phosphatase: 79 U/L (ref 38–126)
Anion gap: 12 (ref 5–15)
BUN: 21 mg/dL (ref 8–23)
CO2: 28 mmol/L (ref 22–32)
Calcium: 9.9 mg/dL (ref 8.9–10.3)
Chloride: 97 mmol/L — ABNORMAL LOW (ref 98–111)
Creatinine, Ser: 0.91 mg/dL (ref 0.61–1.24)
GFR, Estimated: >60 mL/min
Glucose, Bld: 179 mg/dL — ABNORMAL HIGH (ref 70–99)
Potassium: 4 mmol/L (ref 3.5–5.1)
Sodium: 137 mmol/L (ref 135–145)
Total Bilirubin: 0.7 mg/dL (ref 0.0–1.2)
Total Protein: 7.4 g/dL (ref 6.5–8.1)

## 2024-04-21 LAB — MAGNESIUM: Magnesium: 2.4 mg/dL (ref 1.7–2.4)

## 2024-04-21 MED ORDER — DEXAMETHASONE SOD PHOSPHATE PF 10 MG/ML IJ SOLN
10.0000 mg | Freq: Once | INTRAMUSCULAR | Status: AC
  Administered 2024-04-21: 10 mg via INTRAVENOUS

## 2024-04-21 MED ORDER — FLUOROURACIL CHEMO INJECTION 2.5 GM/50ML
400.0000 mg/m2 | Freq: Once | INTRAVENOUS | Status: AC
  Administered 2024-04-21: 1000 mg via INTRAVENOUS
  Filled 2024-04-21: qty 20

## 2024-04-21 MED ORDER — SODIUM CHLORIDE 0.9 % IV SOLN
2400.0000 mg/m2 | INTRAVENOUS | Status: DC
  Administered 2024-04-21: 7000 mg via INTRAVENOUS
  Filled 2024-04-21: qty 140

## 2024-04-21 MED ORDER — SODIUM CHLORIDE 0.9 % IV SOLN
5.0000 mg/kg | Freq: Once | INTRAVENOUS | Status: AC
  Administered 2024-04-21: 700 mg via INTRAVENOUS
  Filled 2024-04-21: qty 16

## 2024-04-21 MED ORDER — PREGABALIN 75 MG PO CAPS
75.0000 mg | ORAL_CAPSULE | Freq: Every day | ORAL | 0 refills | Status: DC
  Filled 2024-04-21: qty 30, 30d supply, fill #0

## 2024-04-21 MED ORDER — LEUCOVORIN CALCIUM INJECTION 350 MG
400.0000 mg/m2 | Freq: Once | INTRAVENOUS | Status: AC
  Administered 2024-04-21: 1076 mg via INTRAVENOUS
  Filled 2024-04-21: qty 53.8

## 2024-04-21 MED ORDER — OXALIPLATIN CHEMO INJECTION 100 MG/20ML
85.0000 mg/m2 | Freq: Once | INTRAVENOUS | Status: DC
  Administered 2024-04-21: 225 mg via INTRAVENOUS
  Filled 2024-04-21: qty 45

## 2024-04-21 MED ORDER — SODIUM CHLORIDE 0.9% FLUSH
500.0000 mL | Freq: Once | INTRAVENOUS | Status: AC
  Administered 2024-04-21: 500 mL via INTRAVENOUS

## 2024-04-21 MED ORDER — SODIUM CHLORIDE 0.9 % IV SOLN
150.0000 mg | Freq: Once | INTRAVENOUS | Status: AC
  Administered 2024-04-21: 150 mg via INTRAVENOUS
  Filled 2024-04-21: qty 150

## 2024-04-21 MED ORDER — SODIUM CHLORIDE 0.9 % IV SOLN: INTRAVENOUS | Status: DC

## 2024-04-21 MED ORDER — PALONOSETRON HCL INJECTION 0.25 MG/5ML
0.2500 mg | Freq: Once | INTRAVENOUS | Status: AC
  Administered 2024-04-21: 0.25 mg via INTRAVENOUS
  Filled 2024-04-21: qty 5

## 2024-04-21 MED ORDER — DEXTROSE 5 % IV SOLN: INTRAVENOUS | Status: DC

## 2024-04-21 NOTE — Progress Notes (Unsigned)
 Patient with previous side effect of nausea.  Adding Emend 150 mg IVPB to premedication for chemotherapy.  OK to proceed with elevated HR.  V.O. Delon Hope, NP/Zion Ta Joshua, PharmD

## 2024-04-21 NOTE — Telephone Encounter (Signed)
 Patient states that he has begun having severe neuropathy in bilateral hands over the past week or so, despite gabapentin  dose.  Per Dr. Davonna, will change to Lyrica  75 mg daily.  Patient aware and verbalized understanding to stop gabapentin  at this time.

## 2024-04-21 NOTE — Patient Instructions (Signed)
 [facility] [place] - A DEPT OF [facility]  Discharge Instructions: Thank you for choosing [facility] to provide your oncology and hematology care.  If you have a lab appointment with the Cancer Center - please note that after April 8th, 2024, all labs will be drawn in the cancer center.  You do not have to check in or register with the main entrance as you have in the past but will complete your check-in in the cancer center.  Wear comfortable clothing and clothing appropriate for easy access to any Portacath or PICC line.   We strive to give you quality time with your provider. You may need to reschedule your appointment if you arrive late (15 or more minutes).  Arriving late affects you and other patients whose appointments are after yours.  Also, if you miss three or more appointments without notifying the office, you may be dismissed from the clinic at the providers discretion.      For prescription refill requests, have your pharmacy contact our office and allow 72 hours for refills to be completed.    Today you received the following chemotherapy and/or immunotherapy agents adrucil . Fluorouracil  Injection What is this medication? FLUOROURACIL  (flure oh YOOR a sil) treats some types of cancer. It works by slowing down the growth of cancer cells. This medicine may be used for other purposes; ask your health care provider or pharmacist if you have questions. COMMON BRAND NAME(S): Adrucil  What should I tell my care team before I take this medication? They need to know if you have any of these conditions: Blood disorders Dihydropyrimidine dehydrogenase (DPD) deficiency Infection, such as chickenpox, cold sores, herpes Kidney disease Liver disease Poor nutrition Recent or ongoing radiation therapy An unusual or allergic reaction to fluorouracil , other medications, foods, dyes, or preservatives If you or your partner are pregnant or trying to get  pregnant Breast-feeding How should I use this medication? This medication is injected into a vein. It is administered by your care team in a hospital or clinic setting. Talk to your care team about the use of this medication in children. Special care may be needed. Overdosage: If you think you have taken too much of this medicine contact a poison control center or emergency room at once. NOTE: This medicine is only for you. Do not share this medicine with others. What if I miss a dose? Keep appointments for follow-up doses. It is important not to miss your dose. Call your care team if you are unable to keep an appointment. What may interact with this medication? Do not take this medication with any of the following: Live virus vaccines This medication may also interact with the following: Medications that treat or prevent blood clots, such as warfarin, enoxaparin , dalteparin This list may not describe all possible interactions. Give your health care provider a list of all the medicines, herbs, non-prescription drugs, or dietary supplements you use. Also tell them if you smoke, drink alcohol, or use illegal drugs. Some items may interact with your medicine. What should I watch for while using this medication? Your condition will be monitored carefully while you are receiving this medication. This medication may make you feel generally unwell. This is not uncommon as chemotherapy can affect healthy cells as well as cancer cells. Report any side effects. Continue your course of treatment even though you feel ill unless your care team tells you to stop. In some cases, you may be given additional  medications to help with side effects. Follow all directions for their use. This medication may increase your risk of getting an infection. Call your care team for advice if you get a fever, chills, sore throat, or other symptoms of a cold or flu. Do not treat yourself. Try to avoid being around people who are  sick. This medication may increase your risk to bruise or bleed. Call your care team if you notice any unusual bleeding. Be careful brushing or flossing your teeth or using a toothpick because you may get an infection or bleed more easily. If you have any dental work done, tell your dentist you are receiving this medication. Avoid taking medications that contain aspirin , acetaminophen , ibuprofen, naproxen, or ketoprofen unless instructed by your care team. These medications may hide a fever. Do not treat diarrhea with over the counter products. Contact your care team if you have diarrhea that lasts more than 2 days or if it is severe and watery. This medication can make you more sensitive to the sun. Keep out of the sun. If you cannot avoid being in the sun, wear protective clothing and sunscreen. Do not use sun lamps, tanning beds, or tanning booths. Talk to your care team if you or your partner wish to become pregnant or think you might be pregnant. This medication can cause serious birth defects if taken during pregnancy and for 3 months after the last dose. A reliable form of contraception is recommended while taking this medication and for 3 months after the last dose. Talk to your care team about effective forms of contraception. Do not father a child while taking this medication and for 3 months after the last dose. Use a condom while having sex during this time period. Do not breastfeed while taking this medication. This medication may cause infertility. Talk to your care team if you are concerned about your fertility. What side effects may I notice from receiving this medication? Side effects that you should report to your care team as soon as possible: Allergic reactions--skin rash, itching, hives, swelling of the face, lips, tongue, or throat Heart attack--pain or tightness in the chest, shoulders, arms, or jaw, nausea, shortness of breath, cold or clammy skin, feeling faint or  lightheaded Heart failure--shortness of breath, swelling of the ankles, feet, or hands, sudden weight gain, unusual weakness or fatigue Heart rhythm changes--fast or irregular heartbeat, dizziness, feeling faint or lightheaded, chest pain, trouble breathing High ammonia level--unusual weakness or fatigue, confusion, loss of appetite, nausea, vomiting, seizures Infection--fever, chills, cough, sore throat, wounds that don't heal, pain or trouble when passing urine, general feeling of discomfort or being unwell Low red blood cell level--unusual weakness or fatigue, dizziness, headache, trouble breathing Pain, tingling, or numbness in the hands or feet, muscle weakness, change in vision, confusion or trouble speaking, loss of balance or coordination, trouble walking, seizures Redness, swelling, and blistering of the skin over hands and feet Severe or prolonged diarrhea Unusual bruising or bleeding Side effects that usually do not require medical attention (report to your care team if they continue or are bothersome): Dry skin Headache Increased tears Nausea Pain, redness, or swelling with sores inside the mouth or throat Sensitivity to light Vomiting This list may not describe all possible side effects. Call your doctor for medical advice about side effects. You may report side effects to FDA at 1-800-FDA-1088. Where should I keep my medication? This medication is given in a hospital or clinic. It will not be stored at home.  NOTE: This sheet is a summary. It may not cover all possible information. If you have questions about this medicine, talk to your doctor, pharmacist, or health care provider.  2024 Elsevier/Gold Standard (2021-08-16 00:00:00)      To help prevent nausea and vomiting after your treatment, we encourage you to take your nausea medication as directed.  BELOW ARE SYMPTOMS THAT SHOULD BE REPORTED IMMEDIATELY: *FEVER GREATER THAN 100.4 F (38 C) OR HIGHER *CHILLS OR  SWEATING *NAUSEA AND VOMITING THAT IS NOT CONTROLLED WITH YOUR NAUSEA MEDICATION *UNUSUAL SHORTNESS OF BREATH *UNUSUAL BRUISING OR BLEEDING *URINARY PROBLEMS (pain or burning when urinating, or frequent urination) *BOWEL PROBLEMS (unusual diarrhea, constipation, pain near the anus) TENDERNESS IN MOUTH AND THROAT WITH OR WITHOUT PRESENCE OF ULCERS (sore throat, sores in mouth, or a toothache) UNUSUAL RASH, SWELLING OR PAIN  UNUSUAL VAGINAL DISCHARGE OR ITCHING   Items with * indicate a potential emergency and should be followed up as soon as possible or go to the Emergency Department if any problems should occur.  Please show the CHEMOTHERAPY ALERT CARD or IMMUNOTHERAPY ALERT CARD at check-in to the Emergency Department and triage nurse. The chemotherapy medication bag should finish at 46 hours, 96 hours, or 7 days. For example, if your pump is scheduled for 46 hours and it was put on at 4:00 p.m., it should finish at 2:00 p.m. the day it is scheduled to come off regardless of your appointment time.     Estimated time to finish at 1:30 pm.   If the display on your pump reads Low Volume and it is beeping, take the batteries out of the pump and come to the cancer center for it to be taken off.   If the pump alarms go off prior to the pump reading Low Volume then call [phone] and someone can assist you.  If the plunger comes out and the chemotherapy medication is leaking out, please use your home chemo spill kit to clean up the spill. Do NOT use paper towels or other household products.  If you have problems or questions regarding your pump, please call either [phone] (24 hours a day) or the cancer center Monday-Friday 8:00 a.m.- 4:30 p.m. at the clinic number and we will assist you. If you are unable to get assistance, then go to the nearest Emergency Department and ask the staff to contact the IV team for assistance.    Should you have questions after your visit or need to  cancel or reschedule your appointment, please contact [facility] [place] - A DEPT OF [facility] [phone]  and follow the prompts.  Office hours are 8:00 a.m. to 4:30 p.m. Monday - Friday. Please note that voicemails left after 4:00 p.m. may not be returned until the following business day.  We are closed weekends and major holidays. You have access to a nurse at all times for urgent questions. Please call the main number to the clinic [phone] and follow the prompts.  For any non-urgent questions, you may also contact your provider using MyChart. We now offer e-Visits for anyone 57 and older to request care online for non-urgent symptoms. For details visit [email or web address].   Also download the MyChart app! Go to the app store, search MyChart, open the app, select [place], and log in with your MyChart username and password.

## 2024-04-21 NOTE — Progress Notes (Unsigned)
 Heart rate 111 after recheck. J.Burns NP aware. Okay to continue with treatment. Provider aware.  Patient presents today for MVASI  and Folfox infusion. Patient has complaints of numbness in his hands that is new. Script placed by T.Myers RN and plan of care discussed with patient. Patient instructed to stop Gabapentin  and start Lyrica  75 mg daily. Understanding verbalized.   Treatment given today per MD orders. Tolerated infusion without adverse affects. Vital signs stable. 5FU pump placed and RUN noted on screen and verified with patient. No concerns noted at this time. Discharged from clinic by wheel chair in stable condition. Alert and oriented x 3. F/U with [facility] as scheduled.

## 2024-04-22 ENCOUNTER — Encounter: Payer: Self-pay | Admitting: Oncology

## 2024-04-22 ENCOUNTER — Other Ambulatory Visit: Payer: Self-pay

## 2024-04-22 LAB — CEA: CEA: 3.8 ng/mL (ref 0.0–4.7)

## 2024-04-23 ENCOUNTER — Other Ambulatory Visit (HOSPITAL_BASED_OUTPATIENT_CLINIC_OR_DEPARTMENT_OTHER): Payer: Self-pay

## 2024-04-23 ENCOUNTER — Other Ambulatory Visit: Payer: Self-pay | Admitting: Oncology

## 2024-04-23 ENCOUNTER — Inpatient Hospital Stay

## 2024-04-23 VITALS — BP 137/89 | HR 89 | Temp 98.3°F | Resp 20

## 2024-04-23 DIAGNOSIS — C189 Malignant neoplasm of colon, unspecified: Secondary | ICD-10-CM

## 2024-04-23 DIAGNOSIS — Z5111 Encounter for antineoplastic chemotherapy: Secondary | ICD-10-CM | POA: Diagnosis not present

## 2024-04-23 MED ORDER — PEGFILGRASTIM-CBQV 6 MG/0.6ML ~~LOC~~ SOSY
6.0000 mg | PREFILLED_SYRINGE | Freq: Once | SUBCUTANEOUS | Status: AC
  Administered 2024-04-23: 6 mg via SUBCUTANEOUS
  Filled 2024-04-23: qty 0.6

## 2024-04-23 MED ORDER — METFORMIN HCL ER 750 MG PO TB24
750.0000 mg | ORAL_TABLET | Freq: Every day | ORAL | 0 refills | Status: DC
  Filled 2024-04-23: qty 30, 30d supply, fill #0

## 2024-04-23 NOTE — Progress Notes (Signed)
Patient presents today for 5FU pump stop and disconnection after 46 hour continous infusion.   5FU pump deaccessed.  Patients port flushed without difficulty.  Good blood return noted with no bruising or swelling noted at site.  Needle removed intact.  Band aid applied.  Udenyca administration without incident; injection site WNL; see MAR for injection details.  Patient tolerated procedure well and without incident.  VSS with discharge and left in satisfactory condition via wheelchair with no s/s of distress noted.

## 2024-04-23 NOTE — Patient Instructions (Signed)
 [facility] [place] - A DEPT OF [facility]  Discharge Instructions: Thank you for choosing [facility] to provide your oncology and hematology care.  If you have a lab appointment with the Cancer Center - please note that after April 8th, 2024, all labs will be drawn in the cancer center.  You do not have to check in or register with the main entrance as you have in the past but will complete your check-in in the cancer center.  Wear comfortable clothing and clothing appropriate for easy access to any Portacath or PICC line.   We strive to give you quality time with your provider. You may need to reschedule your appointment if you arrive late (15 or more minutes).  Arriving late affects you and other patients whose appointments are after yours.  Also, if you miss three or more appointments without notifying the office, you may be dismissed from the clinic at the providers discretion.      For prescription refill requests, have your pharmacy contact our office and allow 72 hours for refills to be completed.    Today you received the following chemotherapy and/or immunotherapy agents Pump stop/udenyca       To help prevent nausea and vomiting after your treatment, we encourage you to take your nausea medication as directed.  BELOW ARE SYMPTOMS THAT SHOULD BE REPORTED IMMEDIATELY: *FEVER GREATER THAN 100.4 F (38 C) OR HIGHER *CHILLS OR SWEATING *NAUSEA AND VOMITING THAT IS NOT CONTROLLED WITH YOUR NAUSEA MEDICATION *UNUSUAL SHORTNESS OF BREATH *UNUSUAL BRUISING OR BLEEDING *URINARY PROBLEMS (pain or burning when urinating, or frequent urination) *BOWEL PROBLEMS (unusual diarrhea, constipation, pain near the anus) TENDERNESS IN MOUTH AND THROAT WITH OR WITHOUT PRESENCE OF ULCERS (sore throat, sores in mouth, or a toothache) UNUSUAL RASH, SWELLING OR PAIN  UNUSUAL VAGINAL DISCHARGE OR ITCHING   Items with * indicate a potential emergency and should be  followed up as soon as possible or go to the Emergency Department if any problems should occur.  Please show the CHEMOTHERAPY ALERT CARD or IMMUNOTHERAPY ALERT CARD at check-in to the Emergency Department and triage nurse.  Should you have questions after your visit or need to cancel or reschedule your appointment, please contact [facility] [place] - A DEPT OF [facility] [phone]  and follow the prompts.  Office hours are 8:00 a.m. to 4:30 p.m. Monday - Friday. Please note that voicemails left after 4:00 p.m. may not be returned until the following business day.  We are closed weekends and major holidays. You have access to a nurse at all times for urgent questions. Please call the main number to the clinic [phone] and follow the prompts.  For any non-urgent questions, you may also contact your provider using MyChart. We now offer e-Visits for anyone 23 and older to request care online for non-urgent symptoms. For details visit [email or web address].   Also download the MyChart app! Go to the app store, search MyChart, open the app, select [place], and log in with your MyChart username and password.

## 2024-04-28 ENCOUNTER — Other Ambulatory Visit: Payer: Self-pay | Admitting: Oncology

## 2024-04-28 ENCOUNTER — Other Ambulatory Visit (HOSPITAL_BASED_OUTPATIENT_CLINIC_OR_DEPARTMENT_OTHER): Payer: Self-pay

## 2024-04-28 DIAGNOSIS — C189 Malignant neoplasm of colon, unspecified: Secondary | ICD-10-CM

## 2024-04-28 MED ORDER — PANTOPRAZOLE SODIUM 40 MG PO TBEC
40.0000 mg | DELAYED_RELEASE_TABLET | Freq: Every day | ORAL | 0 refills | Status: DC
  Filled 2024-04-28: qty 30, 30d supply, fill #0

## 2024-05-04 NOTE — Progress Notes (Unsigned)
 " Patient Care Team: Davonna Siad, MD as PCP - General (Oncology) Davonna Siad, MD as Medical Oncologist (Medical Oncology) Celestia Joesph SQUIBB, RN as Oncology Nurse Navigator (Medical Oncology)  Clinic Day:  05/04/2024  Referring physician: Davonna Siad, MD   CHIEF COMPLAINT:  CC: Metastatic colon carcinoma   ASSESSMENT & PLAN:   Assessment & Plan: Aaron Matthews.  is a 64 y.o. male with metastatic colon carcinoma  Assessment and Plan Assessment & Plan Metastatic colorectal cancer Stage IV metastatic colon adenocarcinoma-metastatic to liver Oncology history below Biopsy-proven from the liver biopsy.  MMR proficient. [facility] NGS: KRAS exon 2 mutation present, PD-L1: 0, HER2 negative -12/31/2023: Started on FOLFOX +Avastin .  Added Avastin  with second cycle  - Patient tolerated treatment well.  No significant complaints noted. - We reviewed th CT scan findings together. Patient has good response to treatment with decrease in size of hepatic lesion. - Cycle 8-day 1 today.Tolerating well. Will discontinue oxaliplatin  from the next cycle.  - Labs reviewed: CMP: Normal creatinine and normal LFTs, CBC: WBC: 2.8, hemoglobin: 12.7, platelets: 147 -Physical exam stable today.  Proceed with chemotherapy today.  -Will repeat CT CAP in 3 months I.e 06/2024 - Will check CEA with every other cycle - Will discontinue oxaliplatin  at this time considering patient has good response and worsening peripheral neuropathy. - Will consider transitioning from 5-FU to Xeloda after next scan if he continues to have a good response.   Return to clinic in 4 weeks.  Metastasis to liver Biopsy-proven as below Radiation not an option.  Due to the extent of the disease surgical resection was not an option Significant improvement in recent scan.  - Chemotherapy as above  Chemotherapy-induced nausea Nausea effectively managed with Zofran .  - Continue preemptive use of Zofran  for nausea  management.  Chemotherapy-induced oral mucositis Significantly improved today  - Continue use of magic mouthwash as needed. - Monitor oral mucositis and consider reducing chemotherapy dose if symptoms worsen.  Leukopenia secondary to chemotherapy Leukopenia present but expected due to chemotherapy. White blood cell count low but not prohibitive for continuing chemotherapy.  - Administer chemotherapy as planned. - Continue growth factor on day 3  Iron  deficiency anemia Resolved at this time  Type 2 diabetes mellitus, poorly controlled Diabetes poorly controlled. Blood sugar levels improved slightly.  - Patient is scheduled to see primary care in March.  Chemotherapy-induced peripheral neuropathy Persistent numbness, tingling, and soreness in hands progressing up arms due to oxaliplatin . Symptoms in feet improved post-discontinuation. Gradual improvement expected; dose escalation of neuropathy medication available if symptoms worsen.  - Discontinued oxaliplatin  due to neuropathy and guideline recommendations. -Continue Lyrica  at 75 mg daily - Discussed expected gradual improvement of neuropathy after stopping oxaliplatin .  Epistaxis due to bevacizumab  Intermittent mild epistaxis attributed to Avastin . Bleeding mild and expected as a side effect of bevacizumab .  - Provided reassurance regarding mild epistaxis as an expected side effect of Avastin .   The patient understands the plans discussed today and is in agreement with them.  He knows to contact our office if he develops concerns prior to his next appointment.  The total time spent in the appointment was 17  minutes for the encounter with patient, including review of chart and various tests results, discussions about plan of care and coordination of care plan    Siad Davonna, MD  [facility] [place] - A DEPT OF [facility] [address] [place]  [address] Dept: [phone] Dept Fax: [phone]   No orders of the defined types were placed in this encounter.    ONCOLOGY HISTORY:   Diagnosis: Metastatic colon adenocarcinoma   -11/28/2023: Cecal mass biopsy: Invasive moderately differentiated adenocarcinoma. -11/28/2023: Colonoscopy: Ulcerated tumor in the cecum.  Markedly redundant and elongated colon. -11/29/2023: CT RJE:Uyzmz is a single ill-defined hypoattenuating 2.4 x 2.7 cm lesion in the right hepatic lobe, segment 5, which is incompletely characterized on the current exam. Further evaluation with nonemergent MRI abdomen as per liver mass protocol is recommended. There are multiple, sub 4 mm, solid, calcified and noncalcified nodules throughout bilateral lungs. Otherwise no metastatic disease identified within the chest, abdomen or pelvis. -11/30/2023: MRI liver:There are at least 5 hepatic lesions, compatible with metastases. The dominant lesion is in the right hepatic lobe, segment 5 measuring 2.9 x 3.2 cm. -12/11/2023: Liver, biopsy, right lobe, mass :       Metastatic adenocarcinoma with necrosis, consistent with colorectal primary  -Evaluated by radiation oncology for liver metastasis, radiation ordered option.  Discussed at tumor board and considering the extent of disease, surgery not an option for the patient. -12/28/2023: IR guided port placement - 12/31/2023-Current: FOLFOX + Bevacizumab .  Avastin  added at cycle 2 - 01/02/2024 [facility] NGS: KRAS exon 2 mutation: Positive, MSI-stable, MMR: Proficient, PD-L1: Negative, TPS:0%  - BRAF, NTRK 1/2/3, RET, EGFR, HER2, NF1, NRAS, PIK3CA,POLE: Negative  -Pathogenic variants: AMER 1, APC, AXIN1, KRAS, TP53 - 03/24/2024: CT CAP: Decreased size of hepatic lesion in segment 5.  Interval decreased hepatic density.  No evidence of metastatic disease in the chest.  Current Treatment:  FOLFOX + Bevacizumab   INTERVAL HISTORY:  Discussed the use of AI scribe software for clinical note  transcription with the patient, who gave verbal consent to proceed.  History of Present Illness Aaron Matthews. is a 64 year old male with metastatic colon cancer involving the liver who presents for ongoing chemotherapy management and assessment of treatment-related toxicities.  He is accompanied by his daughter today.  He is currently receiving systemic chemotherapy for metastatic colon cancer with hepatic metastases and completed his seventh cycle today. During the week of Thanksgiving, he developed a significant viral illness that required postponement of chemotherapy treatments by one week; he has since recovered and reports improved overall well-being. Recent imaging from December 1st demonstrated a favorable response to therapy, with a downward trend in CEA levels. He reports no current pain or functional limitations and was able to travel out of town to visit family.  He continues to experience chemotherapy-induced peripheral neuropathy, described as numbness, tingling, and soreness in both hands, now extending up his arms, with associated cold sensitivity. These symptoms have persisted despite prior medication adjustments, though he notes improvement in his feet since a change in therapy. He is currently taking 75 mg for neuropathy and is aware of the potential for dose escalation.  He reports intermittent oral soreness and a history of mucositis, managed with Magic Mouthwash as needed. He denies current significant oral inflammation or need for additional refills. He experiences nausea following chemotherapy, managed with Zofran  from Thursday through Sunday post-infusion, with resolution the following week. He has had episodes of epistaxis, particularly after chemotherapy, described as significant but painless, attributed to bevacizumab .  Diabetes management remains a consideration, with improved glycemic control (recent glucose 165 mg/dL, previously >799 mg/dL). He is currently on  metformin , Jardiance , and insulin  from a previous provider, and has not yet established care with a new diabetes specialist. He  acknowledges dietary indiscretions but is making efforts to improve control. Proteinuria is being monitored, and he reports decreased urinary stream, though he prefers to defer urologic evaluation. Hemoglobin is stable at 12.7.   I have reviewed the past medical history, past surgical history, social history and family history with the patient and they are unchanged from previous note.  ALLERGIES:  is allergic to insulin  glargine and penicillins.  MEDICATIONS:  Current Outpatient Medications  Medication Sig Dispense Refill   Alpha-Lipoic Acid 600 MG CAPS Take 1 capsule (600 mg total) by mouth 2 (two) times daily. 60 capsule 11   alum & mag hydroxide-simeth (MAALOX/MYLANTA) 200-200-20 MG/5ML suspension Take 15 mLs by mouth every 6 (six) hours as needed for indigestion (Mix 1:1 with lidocaine  and swish and spit). 355 mL 0   aspirin  EC 81 MG tablet Take 81 mg by mouth daily. Swallow whole.     atorvastatin  (LIPITOR) 20 MG tablet Take 1 tablet (20 mg total) by mouth daily. 90 tablet 3   dexamethasone  (DECADRON ) 4 MG tablet Take 2 tablets (8 mg total) by mouth daily. Start the day after chemotherapy for 2 days. Take with food. 30 tablet 1   empagliflozin  (JARDIANCE ) 25 MG TABS tablet Take 1 tablet (25 mg total) by mouth daily. 90 tablet 3   enalapril  (VASOTEC ) 10 MG tablet Take 1 tablet (10 mg total) by mouth 2 (two) times daily. 180 tablet 3   fexofenadine (ALLEGRA) 180 MG tablet Take 180 mg by mouth daily.     furosemide  (LASIX ) 40 MG tablet Take 1 tablet (40 mg total) by mouth daily. 30 tablet 3   gemfibrozil  (LOPID ) 600 MG tablet Take 1 tablet (600 mg total) by mouth 2 (two) times daily. 180 tablet 3   HUMALOG  KWIKPEN 100 UNIT/ML KiwkPen Inject 33 Units into the muscle 3 (three) times daily.     insulin  degludec (TRESIBA ) 100 UNIT/ML FlexTouch Pen Inject 50 Units into  the skin 2 (two) times daily.     insulin  degludec (TRESIBA ) 100 UNIT/ML FlexTouch Pen Inject 100 Units into the skin 2 (two) times daily. 90 mL 3   insulin  lispro (HUMALOG ) 100 UNIT/ML KwikPen Inject 40 Units into the skin 3 (three) times daily. 60 mL 3   JARDIANCE  25 MG TABS tablet Take 1 tablet (25 mg total) by mouth daily. 30 tablet 0   lidocaine  (XYLOCAINE ) 2 % solution Use as directed 15 mLs in the mouth or throat every 6 (six) hours as needed for mouth pain (Mix 1:1 with Maalox and Swish and spit). 200 mL 2   lidocaine -prilocaine  (EMLA ) cream Apply 1 Application topically as needed (Apply prior to port access). 30 g 3   meclizine  (ANTIVERT ) 25 MG tablet Take 1 tablet (25 mg total) by mouth daily as needed for dizziness. 30 tablet 0   metFORMIN  (GLUCOPHAGE -XR) 750 MG 24 hr tablet Take 1 tablet (750 mg total) by mouth daily with breakfast. 30 tablet 0   MOUNJARO  2.5 MG/0.5ML Pen Inject 2.5 mg into the skin once a week.     ondansetron  (ZOFRAN ) 8 MG tablet Take 1 tablet (8 mg total) by mouth every 8 (eight) hours as needed for nausea or vomiting. Start on the third day after chemotherapy. 36 tablet 1   pantoprazole  (PROTONIX ) 40 MG tablet Take 1 tablet (40 mg total) by mouth daily. 30 tablet 0   pregabalin  (LYRICA ) 75 MG capsule Take 1 capsule (75 mg total) by mouth daily. 30 capsule 0   prochlorperazine  (COMPAZINE ) 10  MG tablet Take 1 tablet (10 mg total) by mouth every 6 (six) hours as needed for nausea or vomiting. 30 tablet 1   tirzepatide  (MOUNJARO ) 2.5 MG/0.5ML Pen Inject 2.5 mg into the skin once a week. 4 mL 1   No current facility-administered medications for this visit.   Facility-Administered Medications Ordered in Other Visits  Medication Dose Route Frequency Provider Last Rate Last Admin   0.9 %  sodium chloride  infusion   Intravenous Continuous Inocencia Murtaugh, MD   Stopped at 01/28/24 1113   dextrose  5 % solution   Intravenous Continuous Dejanique Ruehl, MD   Stopped at  01/28/24 1404    VITALS:  There were no vitals taken for this visit.  Wt Readings from Last 3 Encounters:  04/21/24 (!) 308 lb 3.2 oz (139.8 kg)  03/27/24 292 lb 4.8 oz (132.6 kg)  03/22/24 (!) 305 lb 5.4 oz (138.5 kg)    There is no height or weight on file to calculate BMI.  Performance status (ECOG): 1 - Symptomatic but completely ambulatory  PHYSICAL EXAM:   GENERAL:alert, no distress and comfortable SKIN: skin color, texture, turgor are normal, no rashes or significant lesions HEENT: No evidence of mucositis LYMPH:  no palpable lymphadenopathy in the cervical, axillary or inguinal LUNGS: clear to auscultation and percussion with normal breathing effort HEART: regular rate & rhythm and no murmurs and no lower extremity edema ABDOMEN:abdomen soft, non-tender and normal bowel sounds Musculoskeletal:no cyanosis of digits and no clubbing  NEURO: alert & oriented x 3 with fluent speech   LABORATORY DATA:  I have reviewed the data as listed    Component Value Date/Time   NA 137 04/21/2024 0956   K 4.0 04/21/2024 0956   CL 97 (L) 04/21/2024 0956   CO2 28 04/21/2024 0956   GLUCOSE 179 (H) 04/21/2024 0956   BUN 21 04/21/2024 0956   CREATININE 0.91 04/21/2024 0956   CALCIUM  9.9 04/21/2024 0956   PROT 7.4 04/21/2024 0956   ALBUMIN 4.7 04/21/2024 0956   AST 32 04/21/2024 0956   ALT 28 04/21/2024 0956   ALKPHOS 79 04/21/2024 0956   BILITOT 0.7 04/21/2024 0956   GFRNONAA >60 04/21/2024 0956   GFRAA >60 04/16/2016 1019    Lab Results  Component Value Date   WBC 5.3 04/21/2024   NEUTROABS 2.7 04/21/2024   HGB 13.7 04/21/2024   HCT 43.0 04/21/2024   MCV 90.9 04/21/2024   PLT 241 04/21/2024      Chemistry      Component Value Date/Time   NA 137 04/21/2024 0956   K 4.0 04/21/2024 0956   CL 97 (L) 04/21/2024 0956   CO2 28 04/21/2024 0956   BUN 21 04/21/2024 0956   CREATININE 0.91 04/21/2024 0956      Component Value Date/Time   CALCIUM  9.9 04/21/2024 0956    ALKPHOS 79 04/21/2024 0956   AST 32 04/21/2024 0956   ALT 28 04/21/2024 0956   BILITOT 0.7 04/21/2024 0956      Latest Reference Range & Units 02/25/24 09:07  Iron  45 - 182 ug/dL 81  UIBC ug/dL 482  TIBC 749 - 549 ug/dL 401 (H)  Saturation Ratios 17.9 - 39.5 % 14 (L)  Ferritin 24 - 336 ng/mL 104  Folate >5.9 ng/mL >20.0  (H): Data is abnormally high (L): Data is abnormally low   Latest Reference Range & Units 04/21/24 09:56  CEA 0.0 - 4.7 ng/mL 3.8    RADIOGRAPHIC STUDIES: I have personally reviewed the radiological  images as listed and agreed with the findings in the report.  CT CHEST ABDOMEN PELVIS W CONTRAST CLINICAL DATA:  Metastatic colon cancer to liver undergoing chemotherapy. * Tracking Code: BO *  EXAM: CT CHEST, ABDOMEN, AND PELVIS WITH CONTRAST  TECHNIQUE: Multidetector CT imaging of the chest, abdomen and pelvis was performed following the standard protocol during bolus administration of intravenous contrast.  RADIATION DOSE REDUCTION: This exam was performed according to the departmental dose-optimization program which includes automated exposure control, adjustment of the mA and/or kV according to patient size and/or use of iterative reconstruction technique.  CONTRAST:  OMNIPAQUE  IOHEXOL  300 MG/ML  SOLN  COMPARISON:  CT chest, abdomen, and pelvis dated 11/29/2023, MRI abdomen dated 11/30/2023  FINDINGS: CT CHEST FINDINGS  Cardiovascular: Right chest wall port tip terminates at the superior cavoatrial junction. Normal heart size. No significant pericardial fluid/thickening. Great vessels are normal in course and caliber. No central pulmonary emboli. Coronary artery calcifications and aortic atherosclerosis.  Mediastinum/Nodes: Imaged thyroid gland without nodules meeting criteria for imaging follow-up by size. Normal esophagus. No pathologically enlarged axillary, supraclavicular, mediastinal, or hilar lymph nodes.  Lungs/Pleura: The  central airways are patent. Scattered punctate calcified granulomata. Bibasilar linear atelectasis/scarring. No pneumothorax. No pleural effusion.  Musculoskeletal: No acute or abnormal lytic or blastic osseous lesions. Multilevel degenerative changes of the thoracic spine.  CT ABDOMEN PELVIS FINDINGS  Hepatobiliary: Interval decreased size of segment 5 lesion measuring 2.4 x 1.7 cm (4:70), previously 3.2 x 2.9 cm. Additional subcentimeter lesions seen on prior MRI are not well appreciated by CT. Irregular hypodensity adjacent to the hilum measures 6.6 x 2.4 cm (4:56). No intra or extrahepatic biliary ductal dilation. Cholecystectomy.  Pancreas: No focal lesions or main ductal dilation.  Spleen: Unchanged splenomegaly measures 15.6 cm.  Adrenals/Urinary Tract: No adrenal nodules. No suspicious renal mass, calculi, or hydronephrosis. Unchanged left upper pole simple cyst. No focal bladder wall thickening.  Stomach/Bowel: Normal appearance of the stomach. Circumferential mural thickening of the ileum. No abnormal bowel dilation. Ill-defined intraluminal hyperdensity within the rectum may represent contrast material. Underdistended cecum with irregular mild mural thickening, at the site of known primary colon cancer. Normal appendix.  Vascular/Lymphatic: Aortic atherosclerosis. No enlarged abdominal or pelvic lymph nodes.  Reproductive: Prostate is unremarkable.  Other: Mild mesenteric stranding and small volume ascites. No free air or fluid collection. Again seen are ovoid calcified structures located between the duodenum and liver, which may reflect sequela of prior infection/inflammation.  Musculoskeletal: No acute or abnormal lytic or blastic osseous findings. Postsurgical changes of left hip arthroplasty. Hardware appears intact and well seated.  IMPRESSION: 1. Decreased size of segment 5 hepatic lesion. Additional subcentimeter lesions seen on prior MRI are not well  appreciated by CT. 2. Interval decreased hepatic density, in keeping with steatosis. More pronounced irregular hypodensity adjacent to the hilum, which may represent focal fatty infiltration. Consider further evaluation with MRI abdomen. 3. Circumferential mural thickening of the ileum, in keeping with enteritis, which may be infectious, inflammatory, or treatment-related. 4. Underdistended cecum with irregular mild mural thickening, at the site of known primary colon cancer. 5. No evidence of metastatic disease in the chest. 6. Unchanged splenomegaly. 7.  Aortic Atherosclerosis (ICD10-I70.0).  Electronically Signed   By: Limin  Xu M.D.   On: 03/24/2024 14:46    "

## 2024-05-05 ENCOUNTER — Encounter: Payer: Self-pay | Admitting: Oncology

## 2024-05-05 ENCOUNTER — Inpatient Hospital Stay: Attending: Oncology

## 2024-05-05 ENCOUNTER — Inpatient Hospital Stay: Attending: Oncology | Admitting: Oncology

## 2024-05-05 ENCOUNTER — Inpatient Hospital Stay

## 2024-05-05 VITALS — BP 133/78 | HR 84 | Temp 98.2°F | Resp 18

## 2024-05-05 DIAGNOSIS — C180 Malignant neoplasm of cecum: Secondary | ICD-10-CM | POA: Insufficient documentation

## 2024-05-05 DIAGNOSIS — R110 Nausea: Secondary | ICD-10-CM | POA: Insufficient documentation

## 2024-05-05 DIAGNOSIS — Z5111 Encounter for antineoplastic chemotherapy: Secondary | ICD-10-CM | POA: Insufficient documentation

## 2024-05-05 DIAGNOSIS — D701 Agranulocytosis secondary to cancer chemotherapy: Secondary | ICD-10-CM | POA: Insufficient documentation

## 2024-05-05 DIAGNOSIS — K1231 Oral mucositis (ulcerative) due to antineoplastic therapy: Secondary | ICD-10-CM | POA: Insufficient documentation

## 2024-05-05 DIAGNOSIS — G629 Polyneuropathy, unspecified: Secondary | ICD-10-CM

## 2024-05-05 DIAGNOSIS — T451X5A Adverse effect of antineoplastic and immunosuppressive drugs, initial encounter: Secondary | ICD-10-CM | POA: Insufficient documentation

## 2024-05-05 DIAGNOSIS — Z5189 Encounter for other specified aftercare: Secondary | ICD-10-CM | POA: Insufficient documentation

## 2024-05-05 DIAGNOSIS — E1165 Type 2 diabetes mellitus with hyperglycemia: Secondary | ICD-10-CM | POA: Insufficient documentation

## 2024-05-05 DIAGNOSIS — G620 Drug-induced polyneuropathy: Secondary | ICD-10-CM | POA: Diagnosis not present

## 2024-05-05 DIAGNOSIS — C787 Secondary malignant neoplasm of liver and intrahepatic bile duct: Secondary | ICD-10-CM

## 2024-05-05 DIAGNOSIS — C189 Malignant neoplasm of colon, unspecified: Secondary | ICD-10-CM

## 2024-05-05 DIAGNOSIS — D500 Iron deficiency anemia secondary to blood loss (chronic): Secondary | ICD-10-CM

## 2024-05-05 DIAGNOSIS — R040 Epistaxis: Secondary | ICD-10-CM | POA: Insufficient documentation

## 2024-05-05 LAB — CBC WITH DIFFERENTIAL/PLATELET
Abs Immature Granulocytes: 0.02 K/uL (ref 0.00–0.07)
Basophils Absolute: 0 K/uL (ref 0.0–0.1)
Basophils Relative: 1 %
Eosinophils Absolute: 0.2 K/uL (ref 0.0–0.5)
Eosinophils Relative: 5 %
HCT: 39.8 % (ref 39.0–52.0)
Hemoglobin: 12.7 g/dL — ABNORMAL LOW (ref 13.0–17.0)
Immature Granulocytes: 1 %
Lymphocytes Relative: 51 %
Lymphs Abs: 1.4 K/uL (ref 0.7–4.0)
MCH: 29.1 pg (ref 26.0–34.0)
MCHC: 31.9 g/dL (ref 30.0–36.0)
MCV: 91.3 fL (ref 80.0–100.0)
Monocytes Absolute: 0.4 K/uL (ref 0.1–1.0)
Monocytes Relative: 13 %
Neutro Abs: 0.8 K/uL — ABNORMAL LOW (ref 1.7–7.7)
Neutrophils Relative %: 29 %
Platelets: 147 K/uL — ABNORMAL LOW (ref 150–400)
RBC: 4.36 MIL/uL (ref 4.22–5.81)
RDW: 17.7 % — ABNORMAL HIGH (ref 11.5–15.5)
Smear Review: NORMAL
WBC: 2.8 K/uL — ABNORMAL LOW (ref 4.0–10.5)
nRBC: 0 % (ref 0.0–0.2)

## 2024-05-05 LAB — MAGNESIUM: Magnesium: 2 mg/dL (ref 1.7–2.4)

## 2024-05-05 LAB — COMPREHENSIVE METABOLIC PANEL WITH GFR
ALT: 33 U/L (ref 0–44)
AST: 35 U/L (ref 15–41)
Albumin: 4.4 g/dL (ref 3.5–5.0)
Alkaline Phosphatase: 98 U/L (ref 38–126)
Anion gap: 11 (ref 5–15)
BUN: 13 mg/dL (ref 8–23)
CO2: 28 mmol/L (ref 22–32)
Calcium: 9.6 mg/dL (ref 8.9–10.3)
Chloride: 101 mmol/L (ref 98–111)
Creatinine, Ser: 0.79 mg/dL (ref 0.61–1.24)
GFR, Estimated: >60 mL/min
Glucose, Bld: 165 mg/dL — ABNORMAL HIGH (ref 70–99)
Potassium: 3.8 mmol/L (ref 3.5–5.1)
Sodium: 140 mmol/L (ref 135–145)
Total Bilirubin: 0.4 mg/dL (ref 0.0–1.2)
Total Protein: 7 g/dL (ref 6.5–8.1)

## 2024-05-05 MED ORDER — SODIUM CHLORIDE 0.9 % IV SOLN
5.0000 mg/kg | Freq: Once | INTRAVENOUS | Status: AC
  Administered 2024-05-05: 700 mg via INTRAVENOUS
  Filled 2024-05-05: qty 16

## 2024-05-05 MED ORDER — DEXAMETHASONE SOD PHOSPHATE PF 10 MG/ML IJ SOLN
10.0000 mg | Freq: Once | INTRAMUSCULAR | Status: AC
  Administered 2024-05-05: 10 mg via INTRAVENOUS
  Filled 2024-05-05: qty 1

## 2024-05-05 MED ORDER — SODIUM CHLORIDE 0.9 % IV SOLN
2400.0000 mg/m2 | INTRAVENOUS | Status: DC
  Administered 2024-05-05: 7000 mg via INTRAVENOUS
  Filled 2024-05-05: qty 140

## 2024-05-05 MED ORDER — SODIUM CHLORIDE 0.9 % IV SOLN
150.0000 mg | Freq: Once | INTRAVENOUS | Status: AC
  Administered 2024-05-05: 150 mg via INTRAVENOUS
  Filled 2024-05-05: qty 150

## 2024-05-05 MED ORDER — PALONOSETRON HCL INJECTION 0.25 MG/5ML
0.2500 mg | Freq: Once | INTRAVENOUS | Status: AC
  Administered 2024-05-05: 0.25 mg via INTRAVENOUS
  Filled 2024-05-05: qty 5

## 2024-05-05 MED ORDER — SODIUM CHLORIDE 0.9 % IV SOLN
400.0000 mg/m2 | Freq: Once | INTRAVENOUS | Status: AC
  Administered 2024-05-05: 1076 mg via INTRAVENOUS
  Filled 2024-05-05: qty 53.8

## 2024-05-05 MED ORDER — SODIUM CHLORIDE 0.9 % IV SOLN: INTRAVENOUS | Status: DC

## 2024-05-05 MED ORDER — FLUOROURACIL CHEMO INJECTION 2.5 GM/50ML
400.0000 mg/m2 | Freq: Once | INTRAVENOUS | Status: AC
  Administered 2024-05-05: 1000 mg via INTRAVENOUS
  Filled 2024-05-05: qty 20

## 2024-05-05 NOTE — Patient Instructions (Signed)
 [facility] [place] - A DEPT OF [facility]  Discharge Instructions: Thank you for choosing [facility] to provide your oncology and hematology care.  If you have a lab appointment with the Cancer Center - please note that after April 8th, 2024, all labs will be drawn in the cancer center.  You do not have to check in or register with the main entrance as you have in the past but will complete your check-in in the cancer center.  Wear comfortable clothing and clothing appropriate for easy access to any Portacath or PICC line.   We strive to give you quality time with your provider. You may need to reschedule your appointment if you arrive late (15 or more minutes).  Arriving late affects you and other patients whose appointments are after yours.  Also, if you miss three or more appointments without notifying the office, you may be dismissed from the clinic at the provider's discretion.      For prescription refill requests, have your pharmacy contact our office and allow 72 hours for refills to be completed.    Today you received the following chemotherapy and/or immunotherapy agents MVASI/Leucovorin/5FU   To help prevent nausea and vomiting after your treatment, we encourage you to take your nausea medication as directed.  BELOW ARE SYMPTOMS THAT SHOULD BE REPORTED IMMEDIATELY: *FEVER GREATER THAN 100.4 F (38 C) OR HIGHER *CHILLS OR SWEATING *NAUSEA AND VOMITING THAT IS NOT CONTROLLED WITH YOUR NAUSEA MEDICATION *UNUSUAL SHORTNESS OF BREATH *UNUSUAL BRUISING OR BLEEDING *URINARY PROBLEMS (pain or burning when urinating, or frequent urination) *BOWEL PROBLEMS (unusual diarrhea, constipation, pain near the anus) TENDERNESS IN MOUTH AND THROAT WITH OR WITHOUT PRESENCE OF ULCERS (sore throat, sores in mouth, or a toothache) UNUSUAL RASH, SWELLING OR PAIN  UNUSUAL VAGINAL DISCHARGE OR ITCHING   Items with * indicate a potential emergency and should be  followed up as soon as possible or go to the Emergency Department if any problems should occur.  Please show the CHEMOTHERAPY ALERT CARD or IMMUNOTHERAPY ALERT CARD at check-in to the Emergency Department and triage nurse.  Should you have questions after your visit or need to cancel or reschedule your appointment, please contact [facility] [place] - A DEPT OF [facility] [phone]  and follow the prompts.  Office hours are 8:00 a.m. to 4:30 p.m. Monday - Friday. Please note that voicemails left after 4:00 p.m. may not be returned until the following business day.  We are closed weekends and major holidays. You have access to a nurse at all times for urgent questions. Please call the main number to the clinic [phone] and follow the prompts.  For any non-urgent questions, you may also contact your provider using MyChart. We now offer e-Visits for anyone 82 and older to request care online for non-urgent symptoms. For details visit [email or web address].   Also download the MyChart app! Go to the app store, search "MyChart", open the app, select [place], and log in with your MyChart username and password.

## 2024-05-05 NOTE — Patient Instructions (Addendum)
 [facility] at [facility] Discharge Instructions   You were seen and examined today by Dr. Davonna.  She reviewed the results of your lab work which are normal/stable.   We will proceed with your treatment today. We will drop one of the chemo medications called oxaliplatin . We will continue to treat you with 5FU and Avastin .   Return as scheduled.    Thank you for choosing [facility] at [facility] to provide your oncology and hematology care.  To afford each patient quality time with our provider, please arrive at least 15 minutes before your scheduled appointment time.   If you have a lab appointment with the Cancer Center please come in thru the Main Entrance and check in at the main information desk.  You need to re-schedule your appointment should you arrive 10 or more minutes late.  We strive to give you quality time with our providers, and arriving late affects you and other patients whose appointments are after yours.  Also, if you no show three or more times for appointments you may be dismissed from the clinic at the providers discretion.     Again, thank you for choosing [facility].  Our hope is that these requests will decrease the amount of time that you wait before being seen by our physicians.       _____________________________________________________________  Should you have questions after your visit to [facility], please contact our office at [phone] and follow the prompts.  Our office hours are 8:00 a.m. and 4:30 p.m. Monday - Friday.  Please note that voicemails left after 4:00 p.m. may not be returned until the following business day.  We are closed weekends and major holidays.  You do have access to a nurse 24-7, just call the main number to the clinic [phone] and do not press any options, hold on the line and a nurse will answer the phone.    For prescription refill requests, have your  pharmacy contact our office and allow 72 hours.    Due to Covid, you will need to wear a mask upon entering the hospital. If you do not have a mask, a mask will be given to you at the Main Entrance upon arrival. For doctor visits, patients may have 1 support person age 11 or older with them. For treatment visits, patients can not have anyone with them due to social distancing guidelines and our immunocompromised population.

## 2024-05-05 NOTE — Progress Notes (Signed)
 Patient presents today for chemotherapy Avastin /5FU infusion. Patient is in satisfactory condition with no new complaints voiced.  Vital signs are stable.  Labs reviewed by Dr. Davonna during the office visit and all other labs are within treatment parameters. Patient's ANC is 0.8 today, Dr. Davonna made aware. We will be stopping Oxaliplatin  per Dr.Kandala. We will proceed with treatment per MD orders.   Treatment given today per MD orders. Tolerated infusion without adverse affects. Vital signs stable. No complaints at this time. Discharged from clinic ambulatory in stable condition. Alert and oriented x 3. F/U with [facility] as scheduled. 5FU ambulatory pump infusing with no alarms beeping.

## 2024-05-05 NOTE — Progress Notes (Signed)
 SPIRITUAL CARE AND COUNSELING CONSULT NOTE   VISIT SUMMARY   Reason for Visit: Chaplain making scheduled follow-up with Pt I previously connected with.  Description of Visit: I entered the room and found Aaron Matthews sitting in the chair receiving his treatment, with Marijo, his daughter, there as her support person.    Though this visit was on planned a few weeks ago, it turned out to be well timed as Pt had meeting with Doctor Davonna today and received some news that was outside of his expectations.  Pt had been under the impression that after his treatment he would only have to take pills.  He learned today that he would still require some infusion chemotherapy and this caused an emotional disappointment.  Chaplain helped Pt process his emotions through the practice of theological exploration.    Plan of Care: I will continue to follow Aaron Matthews on a bi-weekly basis.   SPIRITUAL ENCOUNTER                                                                                                                                                                      Type of Visit: Follow up Care provided to:: Patient, Family (Daughter Insurance Risk Surveyor) Referral source: Chaplain assessment Reason for visit: Routine spiritual support OnCall Visit: No   SPIRITUAL FRAMEWORK  Presenting Themes: Meaning/purpose/sources of inspiration, Goals in life/care, Values and beliefs, Significant life change, Coping tools, Impactful experiences and emotions, Rituals and practive Values/beliefs: Pt is a professing/practicing Christian who family, spriitual purpose, and serving others. Community/Connection: Family, Faith community, Spiritual leader Strengths: Spirituality, Hope, Gratitude Needs/Challenges/Barriers: Was recently told that he would require chemo treatment for the rest of his life.  This was dissappointing news to the Pt. Patient Stress Factors: Health changes Family Stress Factors: None identified   GOALS    Self/Personal Goals: Continue to fulfill his spiritual purpose while be treated for cancer. Clinical Care Goals: Align care plan with Pt's values.   INTERVENTIONS   Spiritual Care Interventions Made: Compassionate presence, Reflective listening, Narrative/life review, Explored values/beliefs/practices/strengths, Meaning making, Prayer    INTERVENTION OUTCOMES   Outcomes: Connection to spiritual care, Awareness of support, Reduced isolation  SPIRITUAL CARE PLAN   Spiritual Care Issues Still Outstanding: Referring to oncoming chaplain for further support   Maude Roll, MDiv Chaplain, [facility] [email or web address] [phone] 05/05/2024 1:09 PM

## 2024-05-06 ENCOUNTER — Encounter: Payer: Self-pay | Admitting: Oncology

## 2024-05-06 LAB — CEA: CEA: 3.1 ng/mL (ref 0.0–4.7)

## 2024-05-07 ENCOUNTER — Inpatient Hospital Stay

## 2024-05-07 VITALS — BP 129/88 | HR 80 | Temp 97.5°F | Resp 20

## 2024-05-07 DIAGNOSIS — C189 Malignant neoplasm of colon, unspecified: Secondary | ICD-10-CM

## 2024-05-07 DIAGNOSIS — Z5111 Encounter for antineoplastic chemotherapy: Secondary | ICD-10-CM | POA: Diagnosis not present

## 2024-05-07 MED ORDER — PEGFILGRASTIM-CBQV 6 MG/0.6ML ~~LOC~~ SOSY
6.0000 mg | PREFILLED_SYRINGE | Freq: Once | SUBCUTANEOUS | Status: AC
  Administered 2024-05-07: 6 mg via SUBCUTANEOUS
  Filled 2024-05-07: qty 0.6

## 2024-05-07 NOTE — Progress Notes (Signed)
 Patient presents today for pump d/c. Vital signs are stable. Port a cath site clean, dry, and intact. Port flushed with 20 mls of normal saline. Needle removed intact. Band aid applied. Patient has no complaints at this time. Discharged from clinic by wheel chair and in stable condition. Patient alert and oriented.

## 2024-05-12 ENCOUNTER — Encounter: Payer: Self-pay | Admitting: Oncology

## 2024-05-14 ENCOUNTER — Other Ambulatory Visit: Payer: Self-pay | Admitting: Oncology

## 2024-05-14 ENCOUNTER — Other Ambulatory Visit (HOSPITAL_BASED_OUTPATIENT_CLINIC_OR_DEPARTMENT_OTHER): Payer: Self-pay

## 2024-05-14 MED ORDER — PREGABALIN 75 MG PO CAPS
75.0000 mg | ORAL_CAPSULE | Freq: Every day | ORAL | 0 refills | Status: AC
  Filled 2024-05-14 – 2024-05-21 (×3): qty 30, 30d supply, fill #0

## 2024-05-14 MED ORDER — METFORMIN HCL ER 750 MG PO TB24
750.0000 mg | ORAL_TABLET | Freq: Every day | ORAL | 0 refills | Status: AC
  Filled 2024-05-14 – 2024-05-21 (×3): qty 30, 30d supply, fill #0

## 2024-05-14 MED ORDER — PANTOPRAZOLE SODIUM 40 MG PO TBEC
40.0000 mg | DELAYED_RELEASE_TABLET | Freq: Every day | ORAL | 0 refills | Status: AC
  Filled 2024-05-14 – 2024-05-22 (×4): qty 30, 30d supply, fill #0

## 2024-05-15 ENCOUNTER — Other Ambulatory Visit (HOSPITAL_BASED_OUTPATIENT_CLINIC_OR_DEPARTMENT_OTHER): Payer: Self-pay

## 2024-05-16 ENCOUNTER — Other Ambulatory Visit (HOSPITAL_BASED_OUTPATIENT_CLINIC_OR_DEPARTMENT_OTHER): Payer: Self-pay

## 2024-05-19 ENCOUNTER — Inpatient Hospital Stay

## 2024-05-19 ENCOUNTER — Other Ambulatory Visit: Payer: Self-pay

## 2024-05-19 ENCOUNTER — Other Ambulatory Visit (HOSPITAL_BASED_OUTPATIENT_CLINIC_OR_DEPARTMENT_OTHER): Payer: Self-pay

## 2024-05-20 NOTE — Progress Notes (Signed)
" °   05/20/24 1100  Spiritual Encounters  Type of Visit Follow up  Care provided to: Patient  Referral source Chaplain assessment  Reason for visit Routine spiritual support  OnCall Visit No   Reason for Visit: Chaplain making check-in with Pt over the telephone for routine spiritual support.  Description of Visit: Upon answering the phone, I explained to Jag that I was calling to check in with him and she how he was holding up.     Pt states he is doing well. He is anticipating a new step-in shower installation this week.   We will re-connect at his visit on the 2nd.  Plan of Care: I will continue to follow up with this Pt on a monthly basis    Maude Roll, MDiv Chaplain, [facility] [email or web address] [phone] 05/20/2024 11:58 AM  "

## 2024-05-21 ENCOUNTER — Other Ambulatory Visit (HOSPITAL_BASED_OUTPATIENT_CLINIC_OR_DEPARTMENT_OTHER): Payer: Self-pay

## 2024-05-21 ENCOUNTER — Inpatient Hospital Stay

## 2024-05-22 ENCOUNTER — Other Ambulatory Visit (HOSPITAL_BASED_OUTPATIENT_CLINIC_OR_DEPARTMENT_OTHER): Payer: Self-pay

## 2024-05-25 ENCOUNTER — Encounter: Payer: Self-pay | Admitting: *Deleted

## 2024-05-26 ENCOUNTER — Inpatient Hospital Stay

## 2024-05-28 ENCOUNTER — Inpatient Hospital Stay: Attending: Oncology

## 2024-05-28 ENCOUNTER — Inpatient Hospital Stay

## 2024-05-30 ENCOUNTER — Inpatient Hospital Stay

## 2024-06-02 ENCOUNTER — Inpatient Hospital Stay

## 2024-06-02 ENCOUNTER — Inpatient Hospital Stay: Admitting: Oncology

## 2024-06-04 ENCOUNTER — Inpatient Hospital Stay

## 2024-06-09 ENCOUNTER — Inpatient Hospital Stay

## 2024-06-09 ENCOUNTER — Inpatient Hospital Stay: Admitting: Oncology

## 2024-06-11 ENCOUNTER — Inpatient Hospital Stay

## 2024-06-11 ENCOUNTER — Inpatient Hospital Stay: Admitting: Oncology

## 2024-06-11 ENCOUNTER — Inpatient Hospital Stay: Attending: Oncology

## 2024-06-13 ENCOUNTER — Inpatient Hospital Stay

## 2024-07-16 ENCOUNTER — Ambulatory Visit: Admitting: Physician Assistant
